# Patient Record
Sex: Male | Born: 1945 | Race: White | Hispanic: No | Marital: Married | Smoking: Never smoker
Health system: Southern US, Community
[De-identification: ages and names within clinical notes are randomized; demographics above are authoritative.]

## PROBLEM LIST (undated history)

## (undated) DIAGNOSIS — I639 Cerebral infarction, unspecified: Secondary | ICD-10-CM

## (undated) DIAGNOSIS — G4733 Obstructive sleep apnea (adult) (pediatric): Secondary | ICD-10-CM

## (undated) DIAGNOSIS — Z950 Presence of cardiac pacemaker: Secondary | ICD-10-CM

## (undated) DIAGNOSIS — C801 Malignant (primary) neoplasm, unspecified: Secondary | ICD-10-CM

## (undated) DIAGNOSIS — R112 Nausea with vomiting, unspecified: Secondary | ICD-10-CM

## (undated) DIAGNOSIS — H269 Unspecified cataract: Secondary | ICD-10-CM

## (undated) DIAGNOSIS — M1712 Unilateral primary osteoarthritis, left knee: Secondary | ICD-10-CM

## (undated) DIAGNOSIS — E785 Hyperlipidemia, unspecified: Secondary | ICD-10-CM

## (undated) DIAGNOSIS — I10 Essential (primary) hypertension: Secondary | ICD-10-CM

## (undated) DIAGNOSIS — N400 Enlarged prostate without lower urinary tract symptoms: Secondary | ICD-10-CM

## (undated) DIAGNOSIS — M1990 Unspecified osteoarthritis, unspecified site: Secondary | ICD-10-CM

## (undated) DIAGNOSIS — E119 Type 2 diabetes mellitus without complications: Secondary | ICD-10-CM

## (undated) DIAGNOSIS — E039 Hypothyroidism, unspecified: Secondary | ICD-10-CM

## (undated) DIAGNOSIS — I219 Acute myocardial infarction, unspecified: Secondary | ICD-10-CM

## (undated) DIAGNOSIS — I2510 Atherosclerotic heart disease of native coronary artery without angina pectoris: Secondary | ICD-10-CM

## (undated) DIAGNOSIS — K6389 Other specified diseases of intestine: Secondary | ICD-10-CM

## (undated) DIAGNOSIS — K219 Gastro-esophageal reflux disease without esophagitis: Secondary | ICD-10-CM

## (undated) DIAGNOSIS — C9110 Chronic lymphocytic leukemia of B-cell type not having achieved remission: Secondary | ICD-10-CM

## (undated) DIAGNOSIS — G4730 Sleep apnea, unspecified: Secondary | ICD-10-CM

## (undated) DIAGNOSIS — Z9889 Other specified postprocedural states: Secondary | ICD-10-CM

## (undated) HISTORY — DX: Essential (primary) hypertension: I10

## (undated) HISTORY — DX: Other specified diseases of intestine: K63.89

## (undated) HISTORY — PX: OTHER SURGICAL HISTORY: SHX169

## (undated) HISTORY — DX: Cerebral infarction, unspecified: I63.9

## (undated) HISTORY — PX: CARDIAC CATHETERIZATION: SHX172

## (undated) HISTORY — DX: Unspecified cataract: H26.9

## (undated) HISTORY — PX: CHOLECYSTECTOMY: SHX55

## (undated) HISTORY — DX: Obstructive sleep apnea (adult) (pediatric): G47.33

## (undated) HISTORY — DX: Unilateral primary osteoarthritis, left knee: M17.12

## (undated) HISTORY — DX: Hyperlipidemia, unspecified: E78.5

## (undated) HISTORY — DX: Acute myocardial infarction, unspecified: I21.9

## (undated) HISTORY — PX: MM CORP SCREENING MAMMO BIL/CAD (ARMC HX): HXRAD1024

## (undated) HISTORY — DX: Sleep apnea, unspecified: G47.30

## (undated) HISTORY — PX: CORONARY ANGIOPLASTY: SHX604

## (undated) HISTORY — DX: Unspecified osteoarthritis, unspecified site: M19.90

---

## 1999-07-26 ENCOUNTER — Emergency Department (HOSPITAL_COMMUNITY): Admission: EM | Admit: 1999-07-26 | Discharge: 1999-07-26 | Payer: Self-pay | Admitting: Emergency Medicine

## 1999-07-27 ENCOUNTER — Encounter: Payer: Self-pay | Admitting: Emergency Medicine

## 1999-07-28 HISTORY — PX: CORONARY ANGIOPLASTY WITH STENT PLACEMENT: SHX49

## 1999-08-22 ENCOUNTER — Encounter: Payer: Self-pay | Admitting: Surgery

## 1999-08-22 ENCOUNTER — Ambulatory Visit (HOSPITAL_COMMUNITY): Admission: EM | Admit: 1999-08-22 | Discharge: 1999-08-23 | Payer: Self-pay | Admitting: Surgery

## 2003-03-06 ENCOUNTER — Encounter: Admission: RE | Admit: 2003-03-06 | Discharge: 2003-03-06 | Payer: Self-pay | Admitting: Emergency Medicine

## 2003-03-06 ENCOUNTER — Encounter: Payer: Self-pay | Admitting: Emergency Medicine

## 2003-04-10 ENCOUNTER — Ambulatory Visit (HOSPITAL_COMMUNITY): Admission: RE | Admit: 2003-04-10 | Discharge: 2003-04-10 | Payer: Self-pay | Admitting: Cardiovascular Disease

## 2003-04-10 ENCOUNTER — Encounter: Payer: Self-pay | Admitting: Cardiovascular Disease

## 2003-07-28 HISTORY — PX: SHOULDER ARTHROSCOPY WITH ROTATOR CUFF REPAIR AND SUBACROMIAL DECOMPRESSION: SHX5686

## 2003-09-25 ENCOUNTER — Ambulatory Visit (HOSPITAL_BASED_OUTPATIENT_CLINIC_OR_DEPARTMENT_OTHER): Admission: RE | Admit: 2003-09-25 | Discharge: 2003-09-25 | Payer: Self-pay | Admitting: Orthopedic Surgery

## 2003-09-25 ENCOUNTER — Ambulatory Visit (HOSPITAL_COMMUNITY): Admission: RE | Admit: 2003-09-25 | Discharge: 2003-09-25 | Payer: Self-pay | Admitting: Orthopedic Surgery

## 2012-06-09 ENCOUNTER — Encounter (HOSPITAL_COMMUNITY): Payer: Self-pay | Admitting: Pharmacy Technician

## 2012-06-15 ENCOUNTER — Other Ambulatory Visit: Payer: Self-pay | Admitting: Physician Assistant

## 2012-06-15 ENCOUNTER — Encounter: Payer: Self-pay | Admitting: Physician Assistant

## 2012-06-15 DIAGNOSIS — E079 Disorder of thyroid, unspecified: Secondary | ICD-10-CM

## 2012-06-15 DIAGNOSIS — E7800 Pure hypercholesterolemia, unspecified: Secondary | ICD-10-CM

## 2012-06-15 DIAGNOSIS — I219 Acute myocardial infarction, unspecified: Secondary | ICD-10-CM

## 2012-06-15 DIAGNOSIS — I10 Essential (primary) hypertension: Secondary | ICD-10-CM

## 2012-06-15 DIAGNOSIS — M1712 Unilateral primary osteoarthritis, left knee: Secondary | ICD-10-CM | POA: Insufficient documentation

## 2012-06-15 NOTE — H&P (Signed)
TOTAL KNEE ADMISSION H&P  Patient is being admitted for left total knee arthroplasty.  Subjective:  Chief Complaint:left knee pain.  HPI: Brandon Soto, 66 y.o. male, has a history of pain and functional disability in the left knee due to arthritis and has failed non-surgical conservative treatments for greater than 12 weeks to includeNSAID's and/or analgesics, corticosteriod injections, flexibility and strengthening excercises, use of assistive devices, weight reduction as appropriate and activity modification.  Onset of symptoms was gradual, starting 9 years ago with gradually worsening course since that time. The patient noted no past surgery on the left knee(s).  Patient currently rates pain in the left knee(s) at 8 out of 10 with activity. Patient has night pain, worsening of pain with activity and weight bearing, pain that interferes with activities of daily living, crepitus and joint swelling.  Patient has evidence of subchondral sclerosis, periarticular osteophytes and joint space narrowing by imaging studies. . There is no active infection.  Patient Active Problem List   Diagnosis Date Noted  . Thyroid disease   . Hypertension   . Hypercholesteremia   . Heart attack   . Left knee DJD    Past Medical History  Diagnosis Date  . Thyroid disease   . Hypertension   . Hypercholesteremia   . Heart attack 2001    Stent  . Left knee DJD     Past Surgical History  Procedure Date  . Coronary angioplasty with stent placement 2001  . Cholecystectomy, laparoscopic   . Shoulder arthroscopy with rotator cuff repair and subacromial decompression 2005  . Coronary angioplasty      (Not in a hospital admission) No Known Allergies   Current Outpatient Prescriptions on File Prior to Visit  Medication Sig Dispense Refill  . acetaminophen (TYLENOL) 325 MG tablet Take 650 mg by mouth every 6 (six) hours as needed. For pain      . aspirin EC 81 MG tablet Take 81 mg by mouth every morning.        [name] atenolol (TENORMIN) 25 MG tablet Take 12.5 mg by mouth daily.      [name] levothyroxine (SYNTHROID, LEVOTHROID) 25 MCG tablet Take 25 mcg by mouth daily.      [name] losartan (COZAAR) 50 MG tablet Take 50 mg by mouth every morning.      . Multiple Vitamin (MULTIVITAMIN WITH MINERALS) TABS Take 1 tablet by mouth daily.      . naproxen sodium (ANAPROX) 220 MG tablet Take 220 mg by mouth 2 (two) times daily as needed. For pain      . omega-3 acid ethyl esters (LOVAZA) 1 G capsule Take 1 g by mouth 2 (two) times daily.      . rosuvastatin (CRESTOR) 20 MG tablet Take 20 mg by mouth at bedtime.      . Tamsulosin HCl (FLOMAX) 0.4 MG CAPS Take 0.4 mg by mouth daily after supper.         History  Substance Use Topics  . Smoking status: Never Smoker   . Smokeless tobacco: Not on file  . Alcohol Use: No    Family History  Problem Relation Age of Onset  . Heart attack Mother   . Heart disease Brother   . Cancer Brother   . Cancer - Lung Brother     . Review of Systems  Constitutional: Negative.   HENT: Negative.   Eyes: Negative.   Respiratory: Negative.   Cardiovascular: Negative.   Gastrointestinal: Negative.   Genitourinary: Negative.  Musculoskeletal: Positive for back pain and joint pain.  Skin: Negative.   Neurological: Negative.   Endo/Heme/Allergies: Negative.   Psychiatric/Behavioral: Negative.     Objective:  Physical Exam  Constitutional: He is oriented to person, place, and time. He appears well-developed and well-nourished.  HENT:  Head: Normocephalic and atraumatic.  Mouth/Throat: Oropharynx is clear and moist.  Eyes: Conjunctivae normal and EOM are normal. Pupils are equal, round, and reactive to light.  Neck: Normal range of motion.  Cardiovascular: Normal rate, regular rhythm and normal heart sounds.   Respiratory: Effort normal and breath sounds normal.  GI: Soft. Bowel sounds are normal.  Genitourinary:       Not pertinent to current symptomatology therefore not  examined.  Musculoskeletal: Normal range of motion.  Neurological: He is alert and oriented to person, place, and time.  Skin: Skin is warm and dry.  Psychiatric: He has a normal mood and affect. His behavior is normal. Judgment and thought content normal.    Vital signs in last 24 hours: Last recorded: 11/20 1300   BP: 154/85 Pulse: 63  Temp: 97.9 F (36.6 C)    Height: 6' (1.829 m) SpO2: 96  Weight: 108.863 kg (240 lb)     Labs:   Estimated Body mass index is 32.55 kg/(m^2) as calculated from the following:   Height as of this encounter: 6\' 0" (1.829 m).   Weight as of this encounter: 240 lb(108.863 kg).   Imaging Review Plain radiographs demonstrate severe degenerative joint disease of the left knee(s). The overall alignment issignificant varus. The bone quality appears to be good for age and reported activity level.  Assessment/Plan:  End stage arthritis, left knee  Patient Active Problem List  Diagnosis  . Thyroid disease  . Hypertension  . Hypercholesteremia  . Heart attack  . Left knee DJD    The patient history, physical examination, clinical judgment of the provider and imaging studies are consistent with end stage degenerative joint disease of the left knee(s) and total knee arthroplasty is deemed medically necessary. The treatment options including medical management, injection therapy arthroscopy and arthroplasty were discussed at length. The risks and benefits of total knee arthroplasty were presented and reviewed. The risks due to aseptic loosening, infection, stiffness, patella tracking problems, thromboembolic complications and other imponderables were discussed. The patient acknowledged the explanation, agreed to proceed with the plan and consent was signed. Patient is being admitted for inpatient treatment for surgery, pain control, PT, OT, prophylactic antibiotics, VTE prophylaxis, progressive ambulation and ADL's and discharge planning. The patient is  planning to be discharged home with home health services  Jonique Kulig A. Gwinda Passe Physician Assistant [facility] [phone]  06/15/2012, 2:14 PM

## 2012-06-20 ENCOUNTER — Encounter (HOSPITAL_COMMUNITY)
Admission: RE | Admit: 2012-06-20 | Discharge: 2012-06-20 | Disposition: A | Discharge status: Home / Self Care | Payer: BC Managed Care – PPO | Source: Ambulatory Visit | Attending: Orthopedic Surgery | Admitting: Orthopedic Surgery

## 2012-06-20 ENCOUNTER — Encounter (HOSPITAL_COMMUNITY): Payer: Self-pay

## 2012-06-20 ENCOUNTER — Ambulatory Visit (HOSPITAL_COMMUNITY)
Admission: RE | Admit: 2012-06-20 | Discharge: 2012-06-20 | Disposition: A | Discharge status: Home / Self Care | Payer: BC Managed Care – PPO | Source: Ambulatory Visit | Attending: Physician Assistant | Admitting: Physician Assistant

## 2012-06-20 DIAGNOSIS — Z01818 Encounter for other preprocedural examination: Secondary | ICD-10-CM | POA: Insufficient documentation

## 2012-06-20 HISTORY — DX: Benign prostatic hyperplasia without lower urinary tract symptoms: N40.0

## 2012-06-20 HISTORY — DX: Hypothyroidism, unspecified: E03.9

## 2012-06-20 LAB — CBC WITH DIFFERENTIAL/PLATELET
Eosinophils Absolute: 0.1 10*3/uL (ref 0.0–0.7)
Eosinophils Relative: 1 % (ref 0–5)
HCT: 45.4 % (ref 39.0–52.0)
Lymphocytes Relative: 33 % (ref 12–46)
Lymphs Abs: 1.9 10*3/uL (ref 0.7–4.0)
MCH: 33.4 pg (ref 26.0–34.0)
MCV: 95.4 fL (ref 78.0–100.0)
Monocytes Absolute: 0.5 10*3/uL (ref 0.1–1.0)
Platelets: 167 10*3/uL (ref 150–400)
RBC: 4.76 MIL/uL (ref 4.22–5.81)
RDW: 12.1 % (ref 11.5–15.5)
WBC: 5.7 10*3/uL (ref 4.0–10.5)

## 2012-06-20 LAB — URINALYSIS, ROUTINE W REFLEX MICROSCOPIC
Bilirubin Urine: NEGATIVE
Glucose, UA: NEGATIVE mg/dL
Hgb urine dipstick: NEGATIVE
Ketones, ur: NEGATIVE mg/dL
Protein, ur: NEGATIVE mg/dL
Urobilinogen, UA: 0.2 mg/dL (ref 0.0–1.0)

## 2012-06-20 LAB — COMPREHENSIVE METABOLIC PANEL
CO2: 29 mEq/L (ref 19–32)
Calcium: 9.8 mg/dL (ref 8.4–10.5)
Creatinine, Ser: 0.88 mg/dL (ref 0.50–1.35)
GFR calc Af Amer: >90 mL/min (ref >90)
GFR calc non Af Amer: 87 mL/min — ABNORMAL LOW (ref >90)
Glucose, Bld: 109 mg/dL — ABNORMAL HIGH (ref 70–99)
Total Protein: 7.6 g/dL (ref 6.0–8.3)

## 2012-06-20 LAB — SURGICAL PCR SCREEN: MRSA, PCR: NEGATIVE

## 2012-06-20 LAB — ABO/RH: ABO/RH(D): A POS

## 2012-06-20 LAB — PROTIME-INR
INR: 1.05 (ref 0.00–1.49)
Prothrombin Time: 13.6 seconds (ref 11.6–15.2)

## 2012-06-20 LAB — TYPE AND SCREEN
ABO/RH(D): A POS
Antibody Screen: NEGATIVE

## 2012-06-20 NOTE — Pre-Procedure Instructions (Signed)
[address]  06/20/2012   Your procedure is scheduled on:  Monday, December 2nd  Report to [facility] at 0530 AM.  Call this number if you have problems the morning of surgery: [phone]   Remember:   Do not eat food or drink:After Midnight.   Take these medicines the morning of surgery with A SIP OF WATER: atenolol, synthroid, tylenol if needed   Do not wear jewelry, make-up or nail polish.  Do not wear lotions, powders, or perfumes.   Do not shave 48 hours prior to surgery. Men may shave face and neck.  Do not bring valuables to the hospital.  Contacts, dentures or bridgework may not be worn into surgery.  Leave suitcase in the car. After surgery it may be brought to your room.  For patients admitted to the hospital, checkout time is 11:00 AM the day of discharge.   Patients discharged the day of surgery will not be allowed to drive home.   Special Instructions: Shower using CHG 2 nights before surgery and the night before surgery.  If you shower the day of surgery use CHG.  Use special wash - you have one bottle of CHG for all showers.  You should use approximately 1/3 of the bottle for each shower.   Please read over the following fact sheets that you were given: Pain Booklet, Coughing and Deep Breathing, Blood Transfusion Information, MRSA Information and Surgical Site Infection Prevention

## 2012-06-20 NOTE — Progress Notes (Signed)
Primary Physician - Dr. Jenita Seashore [facility] Cardiologist - Dr. Allyson Sabal -  Recent EKG, Stess Test. Echo - will request records

## 2012-06-26 MED ORDER — DEXTROSE 5 % IV SOLN
3.0000 g | INTRAVENOUS | Status: AC
  Administered 2012-06-27: 3 g via INTRAVENOUS
  Filled 2012-06-26: qty 3000

## 2012-06-27 ENCOUNTER — Encounter (HOSPITAL_COMMUNITY): Payer: Self-pay | Admitting: Certified Registered"

## 2012-06-27 ENCOUNTER — Encounter (HOSPITAL_COMMUNITY)
Admission: RE | Disposition: A | Discharge status: Home Health | Payer: Self-pay | Source: Ambulatory Visit | Attending: Orthopedic Surgery

## 2012-06-27 ENCOUNTER — Encounter (HOSPITAL_COMMUNITY): Payer: Self-pay | Admitting: *Deleted

## 2012-06-27 ENCOUNTER — Inpatient Hospital Stay (HOSPITAL_COMMUNITY)
Admission: RE | Admit: 2012-06-27 | Discharge: 2012-06-28 | DRG: 209 | Disposition: A | Discharge status: Home Health | Payer: BC Managed Care – PPO | Source: Ambulatory Visit | Attending: Orthopedic Surgery | Admitting: Orthopedic Surgery

## 2012-06-27 ENCOUNTER — Encounter (HOSPITAL_COMMUNITY): Payer: Self-pay | Admitting: General Practice

## 2012-06-27 ENCOUNTER — Ambulatory Visit (HOSPITAL_COMMUNITY): Payer: BC Managed Care – PPO | Admitting: Certified Registered"

## 2012-06-27 DIAGNOSIS — E039 Hypothyroidism, unspecified: Secondary | ICD-10-CM | POA: Diagnosis present

## 2012-06-27 DIAGNOSIS — Z7982 Long term (current) use of aspirin: Secondary | ICD-10-CM

## 2012-06-27 DIAGNOSIS — Z9861 Coronary angioplasty status: Secondary | ICD-10-CM

## 2012-06-27 DIAGNOSIS — E7800 Pure hypercholesterolemia, unspecified: Secondary | ICD-10-CM | POA: Diagnosis present

## 2012-06-27 DIAGNOSIS — Z79899 Other long term (current) drug therapy: Secondary | ICD-10-CM

## 2012-06-27 DIAGNOSIS — M1710 Unilateral primary osteoarthritis, unspecified knee: Principal | ICD-10-CM | POA: Diagnosis present

## 2012-06-27 DIAGNOSIS — M1712 Unilateral primary osteoarthritis, left knee: Secondary | ICD-10-CM | POA: Diagnosis present

## 2012-06-27 DIAGNOSIS — E079 Disorder of thyroid, unspecified: Secondary | ICD-10-CM | POA: Diagnosis present

## 2012-06-27 DIAGNOSIS — N400 Enlarged prostate without lower urinary tract symptoms: Secondary | ICD-10-CM | POA: Diagnosis present

## 2012-06-27 DIAGNOSIS — Z8249 Family history of ischemic heart disease and other diseases of the circulatory system: Secondary | ICD-10-CM

## 2012-06-27 DIAGNOSIS — M069 Rheumatoid arthritis, unspecified: Secondary | ICD-10-CM | POA: Diagnosis present

## 2012-06-27 DIAGNOSIS — Z9089 Acquired absence of other organs: Secondary | ICD-10-CM

## 2012-06-27 DIAGNOSIS — I2510 Atherosclerotic heart disease of native coronary artery without angina pectoris: Secondary | ICD-10-CM | POA: Diagnosis present

## 2012-06-27 DIAGNOSIS — I219 Acute myocardial infarction, unspecified: Secondary | ICD-10-CM | POA: Diagnosis present

## 2012-06-27 DIAGNOSIS — K219 Gastro-esophageal reflux disease without esophagitis: Secondary | ICD-10-CM | POA: Diagnosis present

## 2012-06-27 DIAGNOSIS — I10 Essential (primary) hypertension: Secondary | ICD-10-CM | POA: Diagnosis present

## 2012-06-27 DIAGNOSIS — I252 Old myocardial infarction: Secondary | ICD-10-CM

## 2012-06-27 HISTORY — DX: Other specified postprocedural states: Z98.890

## 2012-06-27 HISTORY — DX: Atherosclerotic heart disease of native coronary artery without angina pectoris: I25.10

## 2012-06-27 HISTORY — DX: Nausea with vomiting, unspecified: R11.2

## 2012-06-27 HISTORY — PX: PARTIAL KNEE ARTHROPLASTY: SHX2174

## 2012-06-27 HISTORY — DX: Gastro-esophageal reflux disease without esophagitis: K21.9

## 2012-06-27 SURGERY — ARTHROPLASTY, KNEE, UNICOMPARTMENTAL
Anesthesia: General | Site: Knee | Laterality: Left | Wound class: Clean

## 2012-06-27 MED ORDER — LACTATED RINGERS IV SOLN
INTRAVENOUS | Status: DC | PRN
  Administered 2012-06-27 (×2): via INTRAVENOUS

## 2012-06-27 MED ORDER — LEVOTHYROXINE SODIUM 25 MCG PO TABS
25.0000 ug | ORAL_TABLET | Freq: Every day | ORAL | Status: DC
  Administered 2012-06-28: 25 ug via ORAL
  Filled 2012-06-27 (×2): qty 1

## 2012-06-27 MED ORDER — ZOLPIDEM TARTRATE 5 MG PO TABS: 5.0000 mg | ORAL_TABLET | Freq: Every evening | ORAL | Status: DC | PRN

## 2012-06-27 MED ORDER — METOCLOPRAMIDE HCL 5 MG/ML IJ SOLN: 5.0000 mg | Freq: Three times a day (TID) | INTRAMUSCULAR | Status: DC | PRN

## 2012-06-27 MED ORDER — ADULT MULTIVITAMIN W/MINERALS CH
1.0000 | ORAL_TABLET | Freq: Every day | ORAL | Status: DC
  Administered 2012-06-27 – 2012-06-28 (×2): 1 via ORAL
  Filled 2012-06-27 (×2): qty 1

## 2012-06-27 MED ORDER — ACETAMINOPHEN 325 MG PO TABS: 650.0000 mg | ORAL_TABLET | Freq: Four times a day (QID) | ORAL | Status: DC | PRN

## 2012-06-27 MED ORDER — ENOXAPARIN SODIUM 30 MG/0.3ML ~~LOC~~ SOLN
30.0000 mg | Freq: Two times a day (BID) | SUBCUTANEOUS | Status: DC
  Administered 2012-06-28: 30 mg via SUBCUTANEOUS
  Filled 2012-06-27 (×3): qty 0.3

## 2012-06-27 MED ORDER — ATORVASTATIN CALCIUM 40 MG PO TABS
40.0000 mg | ORAL_TABLET | Freq: Every day | ORAL | Status: DC
  Administered 2012-06-27: 40 mg via ORAL
  Filled 2012-06-27 (×2): qty 1

## 2012-06-27 MED ORDER — LACTATED RINGERS IV SOLN: INTRAVENOUS | Status: DC

## 2012-06-27 MED ORDER — GLYCOPYRROLATE 0.2 MG/ML IJ SOLN
INTRAMUSCULAR | Status: DC | PRN
  Administered 2012-06-27 (×2): 0.2 mg via INTRAVENOUS

## 2012-06-27 MED ORDER — CHLORHEXIDINE GLUCONATE 4 % EX LIQD: 60.0000 mL | Freq: Once | CUTANEOUS | Status: DC

## 2012-06-27 MED ORDER — HYDROMORPHONE HCL PF 1 MG/ML IJ SOLN
0.2500 mg | INTRAMUSCULAR | Status: DC | PRN
  Administered 2012-06-27 (×2): 0.5 mg via INTRAVENOUS

## 2012-06-27 MED ORDER — CEFUROXIME SODIUM 1.5 G IJ SOLR
INTRAMUSCULAR | Status: DC | PRN
  Administered 2012-06-27: 1.5 g

## 2012-06-27 MED ORDER — PROPOFOL 10 MG/ML IV BOLUS
INTRAVENOUS | Status: DC | PRN
  Administered 2012-06-27: 190 mg via INTRAVENOUS

## 2012-06-27 MED ORDER — OXYCODONE HCL 5 MG PO TABS
5.0000 mg | ORAL_TABLET | ORAL | Status: DC | PRN
  Administered 2012-06-28: 5 mg via ORAL
  Administered 2012-06-28: 10 mg via ORAL
  Filled 2012-06-27: qty 2
  Filled 2012-06-27: qty 1

## 2012-06-27 MED ORDER — ACETAMINOPHEN 10 MG/ML IV SOLN
1000.0000 mg | Freq: Four times a day (QID) | INTRAVENOUS | Status: AC
  Administered 2012-06-27 – 2012-06-28 (×4): 1000 mg via INTRAVENOUS
  Filled 2012-06-27 (×4): qty 100

## 2012-06-27 MED ORDER — OXYCODONE HCL 5 MG PO TABS
5.0000 mg | ORAL_TABLET | Freq: Once | ORAL | Status: AC | PRN
  Administered 2012-06-27: 5 mg via ORAL

## 2012-06-27 MED ORDER — MENTHOL 3 MG MT LOZG: 1.0000 | LOZENGE | OROMUCOSAL | Status: DC | PRN

## 2012-06-27 MED ORDER — PHENOL 1.4 % MT LIQD: 1.0000 | OROMUCOSAL | Status: DC | PRN

## 2012-06-27 MED ORDER — ONDANSETRON HCL 4 MG/2ML IJ SOLN: 4.0000 mg | Freq: Four times a day (QID) | INTRAMUSCULAR | Status: DC | PRN

## 2012-06-27 MED ORDER — BUPIVACAINE-EPINEPHRINE PF 0.5-1:200000 % IJ SOLN
INTRAMUSCULAR | Status: DC | PRN
  Administered 2012-06-27: 30 mL

## 2012-06-27 MED ORDER — POTASSIUM CHLORIDE IN NACL 20-0.9 MEQ/L-% IV SOLN
INTRAVENOUS | Status: DC
  Administered 2012-06-27 – 2012-06-28 (×2): via INTRAVENOUS
  Filled 2012-06-27 (×5): qty 1000

## 2012-06-27 MED ORDER — DEXAMETHASONE SODIUM PHOSPHATE 10 MG/ML IJ SOLN
10.0000 mg | Freq: Every day | INTRAMUSCULAR | Status: DC
  Administered 2012-06-27: 10 mg via INTRAVENOUS
  Filled 2012-06-27 (×2): qty 1

## 2012-06-27 MED ORDER — ONDANSETRON HCL 4 MG/2ML IJ SOLN
INTRAMUSCULAR | Status: DC | PRN
  Administered 2012-06-27: 4 mg via INTRAVENOUS

## 2012-06-27 MED ORDER — FENTANYL CITRATE 0.05 MG/ML IJ SOLN
INTRAMUSCULAR | Status: DC | PRN
  Administered 2012-06-27: 50 ug via INTRAVENOUS
  Administered 2012-06-27: 100 ug via INTRAVENOUS

## 2012-06-27 MED ORDER — BISACODYL 5 MG PO TBEC
10.0000 mg | DELAYED_RELEASE_TABLET | Freq: Every day | ORAL | Status: DC
  Administered 2012-06-27: 10 mg via ORAL
  Filled 2012-06-27: qty 2

## 2012-06-27 MED ORDER — CEFUROXIME SODIUM 1.5 G IJ SOLR
INTRAMUSCULAR | Status: AC
  Filled 2012-06-27: qty 1.5

## 2012-06-27 MED ORDER — DIPHENHYDRAMINE HCL 12.5 MG/5ML PO ELIX: 12.5000 mg | ORAL_SOLUTION | ORAL | Status: DC | PRN

## 2012-06-27 MED ORDER — BUPIVACAINE-EPINEPHRINE PF 0.25-1:200000 % IJ SOLN
INTRAMUSCULAR | Status: AC
  Filled 2012-06-27: qty 30

## 2012-06-27 MED ORDER — CELECOXIB 200 MG PO CAPS
200.0000 mg | ORAL_CAPSULE | Freq: Two times a day (BID) | ORAL | Status: DC
  Administered 2012-06-27 (×2): 200 mg via ORAL
  Filled 2012-06-27 (×4): qty 1

## 2012-06-27 MED ORDER — METOCLOPRAMIDE HCL 10 MG PO TABS: 5.0000 mg | ORAL_TABLET | Freq: Three times a day (TID) | ORAL | Status: DC | PRN

## 2012-06-27 MED ORDER — HYDROMORPHONE HCL PF 1 MG/ML IJ SOLN
INTRAMUSCULAR | Status: AC
  Administered 2012-06-27: 0.5 mg via INTRAVENOUS
  Filled 2012-06-27: qty 1

## 2012-06-27 MED ORDER — BUPIVACAINE-EPINEPHRINE PF 0.25-1:200000 % IJ SOLN
INTRAMUSCULAR | Status: DC | PRN
  Administered 2012-06-27: 30 mL

## 2012-06-27 MED ORDER — ACETAMINOPHEN 650 MG RE SUPP: 650.0000 mg | Freq: Four times a day (QID) | RECTAL | Status: DC | PRN

## 2012-06-27 MED ORDER — DEXAMETHASONE 4 MG PO TABS
10.0000 mg | ORAL_TABLET | Freq: Every day | ORAL | Status: DC
  Administered 2012-06-28: 10 mg via ORAL
  Filled 2012-06-27 (×2): qty 1

## 2012-06-27 MED ORDER — MIDAZOLAM HCL 5 MG/5ML IJ SOLN
INTRAMUSCULAR | Status: DC | PRN
  Administered 2012-06-27: 2 mg via INTRAVENOUS

## 2012-06-27 MED ORDER — ATENOLOL 12.5 MG HALF TABLET
12.5000 mg | ORAL_TABLET | Freq: Every day | ORAL | Status: DC
  Administered 2012-06-28: 12.5 mg via ORAL
  Filled 2012-06-27: qty 1

## 2012-06-27 MED ORDER — LIDOCAINE HCL (CARDIAC) 20 MG/ML IV SOLN
INTRAVENOUS | Status: DC | PRN
  Administered 2012-06-27: 80 mg via INTRAVENOUS

## 2012-06-27 MED ORDER — HYDROMORPHONE HCL PF 1 MG/ML IJ SOLN
0.5000 mg | INTRAMUSCULAR | Status: DC | PRN
  Administered 2012-06-27: 0.5 mg via INTRAVENOUS
  Filled 2012-06-27: qty 1

## 2012-06-27 MED ORDER — DOCUSATE SODIUM 100 MG PO CAPS
100.0000 mg | ORAL_CAPSULE | Freq: Two times a day (BID) | ORAL | Status: DC
  Administered 2012-06-27 – 2012-06-28 (×3): 100 mg via ORAL
  Filled 2012-06-27 (×3): qty 1

## 2012-06-27 MED ORDER — TAMSULOSIN HCL 0.4 MG PO CAPS
0.4000 mg | ORAL_CAPSULE | Freq: Every day | ORAL | Status: DC
  Administered 2012-06-27: 0.4 mg via ORAL
  Filled 2012-06-27 (×2): qty 1

## 2012-06-27 MED ORDER — CEFAZOLIN SODIUM-DEXTROSE 2-3 GM-% IV SOLR
2.0000 g | Freq: Four times a day (QID) | INTRAVENOUS | Status: AC
  Administered 2012-06-27 (×2): 2 g via INTRAVENOUS
  Filled 2012-06-27 (×2): qty 50

## 2012-06-27 MED ORDER — LOSARTAN POTASSIUM 50 MG PO TABS
50.0000 mg | ORAL_TABLET | Freq: Every day | ORAL | Status: DC
  Administered 2012-06-27 – 2012-06-28 (×2): 50 mg via ORAL
  Filled 2012-06-27 (×2): qty 1

## 2012-06-27 MED ORDER — ALUM & MAG HYDROXIDE-SIMETH 200-200-20 MG/5ML PO SUSP: 30.0000 mL | ORAL | Status: DC | PRN

## 2012-06-27 MED ORDER — ONDANSETRON HCL 4 MG PO TABS: 4.0000 mg | ORAL_TABLET | Freq: Four times a day (QID) | ORAL | Status: DC | PRN

## 2012-06-27 MED ORDER — OXYCODONE HCL 5 MG/5ML PO SOLN: 5.0000 mg | Freq: Once | ORAL | Status: AC | PRN

## 2012-06-27 MED ORDER — 0.9 % SODIUM CHLORIDE (POUR BTL) OPTIME
TOPICAL | Status: DC | PRN
  Administered 2012-06-27: 1000 mL

## 2012-06-27 MED ORDER — POVIDONE-IODINE 7.5 % EX SOLN: Freq: Once | CUTANEOUS | Status: DC

## 2012-06-27 MED ORDER — ONDANSETRON HCL 4 MG/2ML IJ SOLN
4.0000 mg | Freq: Four times a day (QID) | INTRAMUSCULAR | Status: DC | PRN
  Administered 2012-06-27: 4 mg via INTRAVENOUS
  Filled 2012-06-27: qty 2

## 2012-06-27 MED ORDER — OXYCODONE HCL 5 MG PO TABS
ORAL_TABLET | ORAL | Status: AC
  Filled 2012-06-27: qty 1

## 2012-06-27 SURGICAL SUPPLY — 67 items
AUTOTRANSFUSION W/QD PVC DRAIN (AUTOTRANSFUSION) IMPLANT
BANDAGE ELASTIC 4 VELCRO ST LF (GAUZE/BANDAGES/DRESSINGS) ×2 IMPLANT
BANDAGE ELASTIC 6 VELCRO ST LF (GAUZE/BANDAGES/DRESSINGS) ×2 IMPLANT
BANDAGE ESMARK 6X9 LF (GAUZE/BANDAGES/DRESSINGS) ×1 IMPLANT
BNDG ELASTIC 6X10 VLCR STRL LF (GAUZE/BANDAGES/DRESSINGS) ×2 IMPLANT
BNDG ESMARK 6X9 LF (GAUZE/BANDAGES/DRESSINGS) ×2
BOWL SMART MIX CTS (DISPOSABLE) ×2 IMPLANT
CEMENT HV SMART SET (Cement) ×2 IMPLANT
CLOTH BEACON ORANGE TIMEOUT ST (SAFETY) ×2 IMPLANT
COVER BACK TABLE 24X17X13 BIG (DRAPES) IMPLANT
COVER SURGICAL LIGHT HANDLE (MISCELLANEOUS) ×2 IMPLANT
CUFF TOURNIQUET SINGLE 34IN LL (TOURNIQUET CUFF) ×2 IMPLANT
CUFF TOURNIQUET SINGLE 44IN (TOURNIQUET CUFF) IMPLANT
DRAPE EXTREMITY T 121X128X90 (DRAPE) ×2 IMPLANT
DRAPE INCISE IOBAN 66X45 STRL (DRAPES) ×2 IMPLANT
DRAPE PROXIMA HALF (DRAPES) ×2 IMPLANT
DRAPE U-SHAPE 47X51 STRL (DRAPES) ×2 IMPLANT
DRSG ADAPTIC 3X8 NADH LF (GAUZE/BANDAGES/DRESSINGS) ×2 IMPLANT
DRSG PAD ABDOMINAL 8X10 ST (GAUZE/BANDAGES/DRESSINGS) ×4 IMPLANT
DURAPREP 26ML APPLICATOR (WOUND CARE) ×2 IMPLANT
ELECT CAUTERY BLADE 6.4 (BLADE) ×2 IMPLANT
ELECT REM PT RETURN 9FT ADLT (ELECTROSURGICAL) ×2
ELECTRODE REM PT RTRN 9FT ADLT (ELECTROSURGICAL) ×1 IMPLANT
EVACUATOR 1/8 PVC DRAIN (DRAIN) ×2 IMPLANT
FACESHIELD LNG OPTICON STERILE (SAFETY) ×2 IMPLANT
GLOVE BIO SURGEON STRL SZ7 (GLOVE) ×2 IMPLANT
GLOVE BIOGEL PI IND STRL 7.0 (GLOVE) ×3 IMPLANT
GLOVE BIOGEL PI IND STRL 7.5 (GLOVE) ×1 IMPLANT
GLOVE BIOGEL PI IND STRL 8 (GLOVE) ×1 IMPLANT
GLOVE BIOGEL PI INDICATOR 7.0 (GLOVE) ×3
GLOVE BIOGEL PI INDICATOR 7.5 (GLOVE) ×1
GLOVE BIOGEL PI INDICATOR 8 (GLOVE) ×1
GLOVE ORTHO TXT STRL SZ7.5 (GLOVE) ×2 IMPLANT
GLOVE SS BIOGEL STRL SZ 7.5 (GLOVE) ×1 IMPLANT
GLOVE SUPERSENSE BIOGEL SZ 7.5 (GLOVE) ×1
GLOVE SURG SS PI 6.5 STRL IVOR (GLOVE) ×4 IMPLANT
GOWN BRE IMP PREV XXLGXLNG (GOWN DISPOSABLE) ×2 IMPLANT
GOWN PREVENTION PLUS XLARGE (GOWN DISPOSABLE) ×4 IMPLANT
GOWN STRL NON-REIN LRG LVL3 (GOWN DISPOSABLE) IMPLANT
HANDPIECE INTERPULSE COAX TIP (DISPOSABLE) ×1
HOOD PEEL AWAY FACE SHEILD DIS (HOOD) ×6 IMPLANT
IMMOBILIZER KNEE 22 UNIV (SOFTGOODS) ×4 IMPLANT
KIT BASIN OR (CUSTOM PROCEDURE TRAY) ×2 IMPLANT
KIT ROOM TURNOVER OR (KITS) ×2 IMPLANT
KIT SAW BLADE (KITS) ×2 IMPLANT
MANIFOLD NEPTUNE II (INSTRUMENTS) ×2 IMPLANT
NS IRRIG 1000ML POUR BTL (IV SOLUTION) ×2 IMPLANT
PACK TOTAL JOINT (CUSTOM PROCEDURE TRAY) ×2 IMPLANT
PAD ARMBOARD 7.5X6 YLW CONV (MISCELLANEOUS) ×4 IMPLANT
PAD CAST 4YDX4 CTTN HI CHSV (CAST SUPPLIES) ×1 IMPLANT
PADDING CAST COTTON 4X4 STRL (CAST SUPPLIES) ×1
PADDING CAST COTTON 6X4 STRL (CAST SUPPLIES) ×2 IMPLANT
RUBBERBAND STERILE (MISCELLANEOUS) ×2 IMPLANT
SET HNDPC FAN SPRY TIP SCT (DISPOSABLE) ×1 IMPLANT
SPONGE GAUZE 4X4 12PLY (GAUZE/BANDAGES/DRESSINGS) ×2 IMPLANT
STRIP CLOSURE SKIN 1/2X4 (GAUZE/BANDAGES/DRESSINGS) ×2 IMPLANT
SUCTION FRAZIER TIP 10 FR DISP (SUCTIONS) ×2 IMPLANT
SUT ETHIBOND NAB CT1 #1 30IN (SUTURE) ×2 IMPLANT
SUT MNCRL AB 3-0 PS2 18 (SUTURE) ×2 IMPLANT
SUT VIC AB 0 CT1 27 (SUTURE) ×1
SUT VIC AB 0 CT1 27XBRD ANBCTR (SUTURE) ×1 IMPLANT
SUT VIC AB 2-0 CT1 27 (SUTURE) ×1
SUT VIC AB 2-0 CT1 TAPERPNT 27 (SUTURE) ×1 IMPLANT
TOWEL OR 17X24 6PK STRL BLUE (TOWEL DISPOSABLE) ×2 IMPLANT
TOWEL OR 17X26 10 PK STRL BLUE (TOWEL DISPOSABLE) ×2 IMPLANT
TRAY FOLEY CATH 14FR (SET/KITS/TRAYS/PACK) ×2 IMPLANT
WATER STERILE IRR 1000ML POUR (IV SOLUTION) ×4 IMPLANT

## 2012-06-27 NOTE — Evaluation (Signed)
Physical Therapy Evaluation Patient Details Name: Brandon Soto MRN: [id number] DOB: 09-13-1945 Today's Date: 06/27/2012 Time: 4098-1191 PT Time Calculation (min): 24 min  PT Assessment / Plan / Recommendation Clinical Impression  Patient is a 66 yo male s/p Lt. TKA.  Did well with mobility today despite nausea.  Patient will benefit from acute PT to maximize independence prior to discharge home with wife.    PT Assessment  Patient needs continued PT services    Follow Up Recommendations  Home health PT;Supervision/Assistance - 24 hour    Does the patient have the potential to tolerate intense rehabilitation      Barriers to Discharge None      Equipment Recommendations  None recommended by PT    Recommendations for Other Services     Frequency 7X/week    Precautions / Restrictions Precautions Precautions: Knee Precaution Booklet Issued: Yes (comment) Precaution Comments: Reviewed precautions with patient and wife Required Braces or Orthoses: Knee Immobilizer - Left Knee Immobilizer - Left: On except when in CPM;Discontinue once straight leg raise with < 10 degree lag Restrictions Weight Bearing Restrictions: Yes LLE Weight Bearing: Weight bearing as tolerated   Pertinent Vitals/Pain       Mobility  Bed Mobility Bed Mobility: Supine to Sit;Sitting - Scoot to Edge of Bed Supine to Sit: 4: Min assist;HOB flat Sitting - Scoot to [place] of Bed: 4: Min guard Details for Bed Mobility Assistance: Verbal cues for technique.  Assist to move LLE off bed.  Instructed patient and wife donning KI.  Patient became nauseated in sitting and vomited - RN made aware.   Transfers Transfers: Sit to Stand;Stand to Sit Sit to Stand: With upper extremity assist;3: Mod assist;From bed Stand to Sit: 3: Mod assist;With upper extremity assist;With armrests;To chair/3-in-1 Stand Pivot Transfers: 3: Mod assist Details for Transfer Assistance: Verbal cues for technique.  Assist to initiate  standing.  Decreased control of LLE.  Patient able to use UE's on RW to take a few steps to chair.  Cues for LLE placement during descent to chair.  Assist to control descent. Ambulation/Gait Ambulation/Gait Assistance: Not tested (comment)       Exercises Total Joint Exercises Ankle Circles/Pumps: AROM;Both;10 reps;Seated   PT Diagnosis: Difficulty walking;Acute pain  PT Problem List: Decreased strength;Decreased range of motion;Decreased activity tolerance;Decreased mobility;Decreased knowledge of use of DME;Decreased knowledge of precautions;Pain PT Treatment Interventions: DME instruction;Gait training;Stair training;Functional mobility training;Therapeutic exercise;Patient/family education   PT Goals Acute Rehab PT Goals PT Goal Formulation: With patient/family Time For Goal Achievement: 07/04/12 Potential to Achieve Goals: Good Pt will go Supine/Side to Sit: Independently;with HOB 0 degrees PT Goal: Supine/Side to Sit - Progress: Goal set today Pt will go Sit to Supine/Side: Independently;with HOB 0 degrees PT Goal: Sit to Supine/Side - Progress: Goal set today Pt will go Sit to Stand: with supervision;with upper extremity assist PT Goal: Sit to Stand - Progress: Goal set today Pt will Ambulate: >150 feet;with supervision;with rolling walker PT Goal: Ambulate - Progress: Goal set today Pt will Go Up / Down Stairs: 1-2 stairs;with supervision;with least restrictive assistive device PT Goal: Up/Down Stairs - Progress: Goal set today Pt will Perform Home Exercise Program: Independently PT Goal: Perform Home Exercise Program - Progress: Goal set today  Visit Information  Last PT Received On: 06/27/12 Assistance Needed: +1    Subjective Data  Subjective: "I still want to get to the chair" (Patient nauseated/vomited in sitting) Patient Stated Goal: To go home soon   Prior Functioning  Home Living Lives With: Spouse Available Help at Discharge: Family;Available 24 hours/day (x  2 weeks) Type of Home: House Home Access: Stairs to enter [employer] of Steps: 1 Entrance Stairs-Rails: None Home Layout: One level Bathroom Shower/Tub: Psychologist, counselling (built-in seat) Bathroom Toilet: Handicapped height Bathroom Accessibility: Yes How Accessible: Accessible via walker Home Adaptive Equipment: Walker - rolling;Straight cane;Crutches Prior Function Level of Independence: Independent Able to Take Stairs?: Yes Driving: Yes Vocation: Retired Musician: No difficulties    Cognition  Overall Cognitive Status: Appears within functional limits for tasks assessed/performed Arousal/Alertness: Awake/alert Orientation Level: Oriented X4 / Intact Behavior During Session: WFL for tasks performed    Extremity/Trunk Assessment Right Upper Extremity Assessment RUE ROM/Strength/Tone: Within functional levels RUE Sensation: WFL - Light Touch Left Upper Extremity Assessment LUE ROM/Strength/Tone: Within functional levels LUE Sensation: WFL - Light Touch Right Lower Extremity Assessment RLE ROM/Strength/Tone: Within functional levels RLE Sensation: WFL - Light Touch Left Lower Extremity Assessment LLE ROM/Strength/Tone: Deficits;Unable to fully assess;Due to pain LLE ROM/Strength/Tone Deficits: Able to assist moving LLE off bed. Trunk Assessment Trunk Assessment: Normal   Balance Balance Balance Assessed: Yes Static Sitting Balance Static Sitting - Balance Support: No upper extremity supported;Feet supported Static Sitting - Level of Assistance: 5: Stand by assistance Static Sitting - Comment/# of Minutes: Patient sat on EOB x 8 minutes.  Able to maintain balance without UE support. (Pt became nauseated and vomited - RN notified)  End of Session PT - End of Session Equipment Utilized During Treatment: Gait belt;Left knee immobilizer;Oxygen Activity Tolerance: Patient limited by fatigue;Treatment limited secondary to medical complications  (Comment) (Nausea/vomiting) Patient left: in chair;with call bell/phone within reach;with family/visitor present Nurse Communication: Mobility status (Nausea) CPM Left Knee CPM Left Knee: Off Left Knee Flexion (Degrees): 60  Left Knee Extension (Degrees): 0   GP     Vena Austria 06/27/2012, 4:59 PM Durenda Hurt. Renaldo Fiddler, [facility] Acute Rehab Services Pager [phone]

## 2012-06-27 NOTE — H&P (View-Only) (Signed)
TOTAL KNEE ADMISSION H&P  Patient is being admitted for left total knee arthroplasty.  Subjective:  Chief Complaint:left knee pain.  HPI: Brandon Soto, 66 y.o. male, has a history of pain and functional disability in the left knee due to arthritis and has failed non-surgical conservative treatments for greater than 12 weeks to includeNSAID's and/or analgesics, corticosteriod injections, flexibility and strengthening excercises, use of assistive devices, weight reduction as appropriate and activity modification.  Onset of symptoms was gradual, starting 9 years ago with gradually worsening course since that time. The patient noted no past surgery on the left knee(s).  Patient currently rates pain in the left knee(s) at 8 out of 10 with activity. Patient has night pain, worsening of pain with activity and weight bearing, pain that interferes with activities of daily living, crepitus and joint swelling.  Patient has evidence of subchondral sclerosis, periarticular osteophytes and joint space narrowing by imaging studies. . There is no active infection.  Patient Active Problem List   Diagnosis Date Noted  . Thyroid disease   . Hypertension   . Hypercholesteremia   . Heart attack   . Left knee DJD    Past Medical History  Diagnosis Date  . Thyroid disease   . Hypertension   . Hypercholesteremia   . Heart attack 2001    Stent  . Left knee DJD     Past Surgical History  Procedure Date  . Coronary angioplasty with stent placement 2001  . Cholecystectomy, laparoscopic   . Shoulder arthroscopy with rotator cuff repair and subacromial decompression 2005  . Coronary angioplasty      (Not in a hospital admission) No Known Allergies   Current Outpatient Prescriptions on File Prior to Visit  Medication Sig Dispense Refill  . acetaminophen (TYLENOL) 325 MG tablet Take 650 mg by mouth every 6 (six) hours as needed. For pain      . aspirin EC 81 MG tablet Take 81 mg by mouth every morning.        . atenolol (TENORMIN) 25 MG tablet Take 12.5 mg by mouth daily.      . levothyroxine (SYNTHROID, LEVOTHROID) 25 MCG tablet Take 25 mcg by mouth daily.      . losartan (COZAAR) 50 MG tablet Take 50 mg by mouth every morning.      . Multiple Vitamin (MULTIVITAMIN WITH MINERALS) TABS Take 1 tablet by mouth daily.      . naproxen sodium (ANAPROX) 220 MG tablet Take 220 mg by mouth 2 (two) times daily as needed. For pain      . omega-3 acid ethyl esters (LOVAZA) 1 G capsule Take 1 g by mouth 2 (two) times daily.      . rosuvastatin (CRESTOR) 20 MG tablet Take 20 mg by mouth at bedtime.      . Tamsulosin HCl (FLOMAX) 0.4 MG CAPS Take 0.4 mg by mouth daily after supper.         History  Substance Use Topics  . Smoking status: Never Smoker   . Smokeless tobacco: Not on file  . Alcohol Use: No    Family History  Problem Relation Age of Onset  . Heart attack Mother   . Heart disease Brother   . Cancer Brother   . Cancer - Lung Brother     . Review of Systems  Constitutional: Negative.   HENT: Negative.   Eyes: Negative.   Respiratory: Negative.   Cardiovascular: Negative.   Gastrointestinal: Negative.   Genitourinary: Negative.     Musculoskeletal: Positive for back pain and joint pain.  Skin: Negative.   Neurological: Negative.   Endo/Heme/Allergies: Negative.   Psychiatric/Behavioral: Negative.     Objective:  Physical Exam  Constitutional: He is oriented to person, place, and time. He appears well-developed and well-nourished.  HENT:  Head: Normocephalic and atraumatic.  Mouth/Throat: Oropharynx is clear and moist.  Eyes: Conjunctivae normal and EOM are normal. Pupils are equal, round, and reactive to light.  Neck: Normal range of motion.  Cardiovascular: Normal rate, regular rhythm and normal heart sounds.   Respiratory: Effort normal and breath sounds normal.  GI: Soft. Bowel sounds are normal.  Genitourinary:       Not pertinent to current symptomatology therefore not  examined.  Musculoskeletal: Normal range of motion.  Neurological: He is alert and oriented to person, place, and time.  Skin: Skin is warm and dry.  Psychiatric: He has a normal mood and affect. His behavior is normal. Judgment and thought content normal.    Vital signs in last 24 hours: Last recorded: 11/20 1300   BP: 154/85 Pulse: 63  Temp: 97.9 F (36.6 C)    Height: 6' (1.829 m) SpO2: 96  Weight: 108.863 kg (240 lb)     Labs:   Estimated Body mass index is 32.55 kg/(m^2) as calculated from the following:   Height as of this encounter: 6' 0"(1.829 m).   Weight as of this encounter: 240 lb(108.863 kg).   Imaging Review Plain radiographs demonstrate severe degenerative joint disease of the left knee(s). The overall alignment issignificant varus. The bone quality appears to be good for age and reported activity level.  Assessment/Plan:  End stage arthritis, left knee  Patient Active Problem List  Diagnosis  . Thyroid disease  . Hypertension  . Hypercholesteremia  . Heart attack  . Left knee DJD    The patient history, physical examination, clinical judgment of the provider and imaging studies are consistent with end stage degenerative joint disease of the left knee(s) and total knee arthroplasty is deemed medically necessary. The treatment options including medical management, injection therapy arthroscopy and arthroplasty were discussed at length. The risks and benefits of total knee arthroplasty were presented and reviewed. The risks due to aseptic loosening, infection, stiffness, patella tracking problems, thromboembolic complications and other imponderables were discussed. The patient acknowledged the explanation, agreed to proceed with the plan and consent was signed. Patient is being admitted for inpatient treatment for surgery, pain control, PT, OT, prophylactic antibiotics, VTE prophylaxis, progressive ambulation and ADL's and discharge planning. The patient is  planning to be discharged home with home health services  Evelynn Hench A. Treyson Axel, PA-C Physician Assistant [facility] [phone]  06/15/2012, 2:14 PM 

## 2012-06-27 NOTE — Anesthesia Preprocedure Evaluation (Addendum)
Anesthesia Evaluation  Patient identified by MRN, date of birth, ID band Patient awake    Reviewed: Allergy & Precautions, H&P , NPO status , Patient's Chart, lab work & pertinent test results, reviewed documented beta blocker date and time   Airway Mallampati: II TM Distance: >3 FB Neck ROM: full    Dental  (+) Teeth Intact and Dental Advidsory Given   Pulmonary          Cardiovascular hypertension, Pt. on home beta blockers and Pt. on medications + Past MI (2001 - no CP since stent placement) and + Cardiac Stents     Neuro/Psych    GI/Hepatic   Endo/Other  Hypothyroidism   Renal/GU      Musculoskeletal   Abdominal   Peds  Hematology   Anesthesia Other Findings   Reproductive/Obstetrics                          Anesthesia Physical Anesthesia Plan  ASA: III  Anesthesia Plan: General and Regional   Post-op Pain Management: MAC Combined w/ Regional for Post-op pain   Induction: Intravenous  Airway Management Planned: LMA  Additional Equipment:   Intra-op Plan:   Post-operative Plan:   Informed Consent: I have reviewed the patients History and Physical, chart, labs and discussed the procedure including the risks, benefits and alternatives for the proposed anesthesia with the patient or authorized representative who has indicated his/her understanding and acceptance.     Plan Discussed with: CRNA and Surgeon  Anesthesia Plan Comments:         Anesthesia Quick Evaluation

## 2012-06-27 NOTE — Brief Op Note (Signed)
06/27/2012  9:11 AM  PATIENT:  [name]  66 y.o. male  PRE-OPERATIVE DIAGNOSIS:  LEFT KNEE DJD  POST-OPERATIVE DIAGNOSIS:  LEFT KNEE DJD  PROCEDURE:  Procedure(s) (LRB) with comments: UNICOMPARTMENTAL KNEE (Left) - left unicompartmental knee  SURGEON:  Surgeon(s) and Role:    * Nilda Simmer, MD - Primary    * Eulas Post, MD - Assisting  PHYSICIAN ASSISTANT: Kirstin Shepperson PA-C  ASSISTANTS:Joshua Dion Saucier MD, Kirstin Shepperson PA-C  ANESTHESIA:   general  EBL:  Total I/O In: 1000 [I.V.:1000] Out: -   BLOOD ADMINISTERED:none  DRAINS: (1) Hemovact drain(s) in the left knee with  Suction Clamped   LOCAL MEDICATIONS USED:  NONE  SPECIMEN:  No Specimen  DISPOSITION OF SPECIMEN:  N/A  COUNTS:  YES  TOURNIQUET:  * Missing tourniquet times found for documented tourniquets in log:  16109 *  DICTATION: .Note written in EPIC  PLAN OF CARE: Admit to inpatient   PATIENT DISPOSITION:  PACU - hemodynamically stable.   Delay start of Pharmacological VTE agent (>24hrs) due to surgical blood loss or risk of bleeding: no

## 2012-06-27 NOTE — Progress Notes (Signed)
UR COMPLETED  

## 2012-06-27 NOTE — Anesthesia Procedure Notes (Addendum)
Procedure Name: LMA Insertion Date/Time: 06/27/2012 7:23 AM Performed by: Rossie Muskrat L Pre-anesthesia Checklist: Patient identified, Timeout performed, Emergency Drugs available, Suction available and Patient being monitored Patient Re-evaluated:Patient Re-evaluated prior to inductionOxygen Delivery Method: Circle system utilized Preoxygenation: Pre-oxygenation with 100% oxygen Intubation Type: IV induction Ventilation: Mask ventilation without difficulty LMA: LMA inserted LMA Size: 5.0 Number of attempts: 1 Placement Confirmation: positive ETCO2 and breath sounds checked- equal and bilateral Tube secured with: Tape Dental Injury: Teeth and Oropharynx as per pre-operative assessment    Anesthesia Regional Block:  Femoral nerve block  Pre-Anesthetic Checklist: ,, timeout performed, Correct Patient, Correct Site, Correct Laterality, Correct Procedure,, site marked, risks and benefits discussed, Surgical consent,  Pre-op evaluation,  At surgeon's request and post-op pain management  Laterality: Left  Prep: chloraprep       Needles:  Injection technique: Single-shot  Needle Type: Echogenic Stimulator Needle     Needle Length: 9cm  Needle Gauge: 21    Additional Needles:  Procedures: nerve stimulator Femoral nerve block  Nerve Stimulator or Paresthesia:  Response: Quadriceps muscle contraction, 0.45 mA,   Additional Responses:   Narrative:  Start time: 06/27/2012 7:02 AM End time: 06/27/2012 7:11 AM Injection made incrementally with aspirations every 5 mL.  Performed by: Personally  Anesthesiologist: Dr Chaney Malling  Additional Notes: Functioning IV was confirmed and monitors were applied.  A 90mm 21ga Arrow echogenic stimulator needle was used. Sterile prep and drape,hand hygiene and sterile gloves were used.  Negative aspiration and negative test dose prior to incremental administration of local anesthetic. The patient tolerated the procedure well.    Femoral nerve  block

## 2012-06-27 NOTE — Preoperative (Signed)
Beta Blockers   Reason not to administer Beta Blockers:B blocker taken 06/27/12 @ 0500

## 2012-06-27 NOTE — Transfer of Care (Signed)
Immediate Anesthesia Transfer of Care Note  Patient: Brandon Soto  Procedure(s) Performed: Procedure(s) (LRB) with comments: UNICOMPARTMENTAL KNEE (Left) - left unicompartmental knee  Patient Location: PACU  Anesthesia Type:General  Level of Consciousness: awake, alert  and oriented  Airway & Oxygen Therapy: Patient Spontanous Breathing and Patient connected to nasal cannula oxygen  Post-op Assessment: Report given to PACU RN and Post -op Vital signs reviewed and stable  Post vital signs: Reviewed and stable  Complications: No apparent anesthesia complications

## 2012-06-27 NOTE — Progress Notes (Signed)
Report given to Kay RN

## 2012-06-27 NOTE — Interval H&P Note (Signed)
History and Physical Interval Note:  06/27/2012 7:02 AM  Brandon Soto  has presented today for surgery, with the diagnosis of LT KNEE DJD  The various methods of treatment have been discussed with the patient and family. After consideration of risks, benefits and other options for treatment, the patient has consented to  Procedure(s) (LRB) with comments: UNICOMPARTMENTAL KNEE (Left) as a surgical intervention .  The patient's history has been reviewed, patient examined, no change in status, stable for surgery.  I have reviewed the patient's chart and labs.  Questions were answered to the patient's satisfaction.     Salvatore Marvel A

## 2012-06-27 NOTE — Progress Notes (Signed)
Orthopedic Tech Progress Note Patient Details:  Brandon Soto 23-Dec-1945 [id number]  CPM Left Knee CPM Left Knee: On Left Knee Flexion (Degrees): 60  Left Knee Extension (Degrees): 0  Additional Comments: trapeze bar   Shawnie Pons 06/27/2012, 12:03 PM

## 2012-06-27 NOTE — Anesthesia Postprocedure Evaluation (Signed)
Anesthesia Post Note  Patient: Brandon Soto  Procedure(s) Performed: Procedure(s) (LRB): UNICOMPARTMENTAL KNEE (Left)  Anesthesia type: General  Patient location: PACU  Post pain: Pain level controlled and Adequate analgesia  Post assessment: Post-op Vital signs reviewed, Patient's Cardiovascular Status Stable, Respiratory Function Stable, Patent Airway and Pain level controlled  Last Vitals:  Filed Vitals:   06/27/12 0939  BP: 112/54  Pulse: 69  Temp: 36 C  Resp: 15    Post vital signs: Reviewed and stable  Level of consciousness: awake, alert  and oriented  Complications: No apparent anesthesia complications

## 2012-06-28 ENCOUNTER — Encounter (HOSPITAL_COMMUNITY): Payer: Self-pay | Admitting: *Deleted

## 2012-06-28 LAB — BASIC METABOLIC PANEL
BUN: 12 mg/dL (ref 6–23)
CO2: 28 mEq/L (ref 19–32)
Calcium: 9 mg/dL (ref 8.4–10.5)
GFR calc non Af Amer: 90 mL/min — ABNORMAL LOW (ref >90)
Glucose, Bld: 130 mg/dL — ABNORMAL HIGH (ref 70–99)

## 2012-06-28 LAB — CBC
Platelets: 153 10*3/uL (ref 150–400)
RDW: 12.4 % (ref 11.5–15.5)
WBC: 10 10*3/uL (ref 4.0–10.5)

## 2012-06-28 MED ORDER — DSS 100 MG PO CAPS: ORAL_CAPSULE | ORAL | Status: DC

## 2012-06-28 MED ORDER — OXYCODONE HCL 5 MG PO TABS: ORAL_TABLET | ORAL | Status: DC

## 2012-06-28 MED ORDER — ENOXAPARIN SODIUM 30 MG/0.3ML ~~LOC~~ SOLN: 30.0000 mg | Freq: Two times a day (BID) | SUBCUTANEOUS | Status: DC

## 2012-06-28 MED ORDER — BISACODYL 5 MG PO TBEC: DELAYED_RELEASE_TABLET | ORAL | Status: DC

## 2012-06-28 NOTE — Evaluation (Signed)
Occupational Therapy Evaluation Patient Details Name: Brandon Soto MRN: [id number] DOB: [birth year] Today's Date: 06/28/2012 Time: 4098-1191 OT Time Calculation (min): 41 min  OT Assessment / Plan / Recommendation Clinical Impression  66 yo male s/p LT TKA that could benefit from skilled OT acutely. Recommend HHOT for d/c home.     OT Assessment  Patient needs continued OT Services    Follow Up Recommendations  Home health OT    Barriers to Discharge      Equipment Recommendations  None recommended by OT    Recommendations for Other Services    Frequency  Min 2X/week    Precautions / Restrictions Precautions Precautions: Knee Precaution Booklet Issued: Yes (comment) Required Braces or Orthoses: Knee Immobilizer - Left Knee Immobilizer - Left: On except when in CPM;Discontinue once straight leg raise with < 10 degree lag Restrictions Weight Bearing Restrictions: Yes LLE Weight Bearing: Weight bearing as tolerated   Pertinent Vitals/Pain 5 out 10 pain Pt c/o pain s/p fall at back but then says "I'm okay"    ADL  Eating/Feeding: Set up Where Assessed - Eating/Feeding: Chair Toilet Transfer: Radiographer, therapeutic Method: Sit to Barista: Raised toilet seat with arms (or 3-in-1 over toilet) Tub/Shower Transfer: Other (comment) (pt completed transfer in Min (A)) Tub/Shower Transfer Method: Ambulating (Pt with Rt knee buckle required total+2 off floor) Tub/Shower Transfer Equipment: Walk in shower Equipment Used: Gait belt;Rolling walker;Knee Immobilizer Transfers/Ambulation Related to ADLs: Pt completed basic transfer from chair to standing with RW. Pt completed ~5 steps to start shower simulation.See adl section ADL Comments: Pt verbalized to OT the sequence used during step transfer with PT this AM. Pt 100% correct recall. OT educated patient and wife on how to use same sequence for shower transfer with demonstration. Pt then completed  sit<>stand and positioned self to step over pink spit basin to simulate shower rim. Pt completed transfer with non operated leg into shower followed by operated. WHen completing shower transfer back out of the shower pt attempted to exit using the non operated side. OT max v/c to correct sequence before progressing. Pt attempted to correct and operated leg buckled. PT was assisted back to upright posture and then operated leg buckled again and pt shifted all weight onto operated side. Therapist then used the wall to assist patient to floor and grabbed knee immobilizer to keep operated leg extended as much as possible for declined to floor. Pt positioned on floor in safe position. Pt with obvious bend to KI after fall so new KI order and PA called by RN. OT spoke directly to PA regarding fall with details. PT (A)ed off the floor by RN and OT. PT positioned in chair with operated knee extended and ice applied. RN Teacher, early years/pre in room addressing fall with family. OT spoke to patient and family and will reattempt session in PM. Pt and wife apologizing at this time. Pt expressed embarrassment over fall. Pt ensured that it will get stronger and that we will practice again to make sure everyone feels safe before discharge.    OT Diagnosis: Generalized weakness;Acute pain  OT Problem List: Decreased strength;Decreased activity tolerance;Impaired balance (sitting and/or standing);Decreased safety awareness;Decreased knowledge of use of DME or AE;Decreased knowledge of precautions;Pain OT Treatment Interventions: Self-care/ADL training;DME and/or AE instruction;Therapeutic activities;Patient/family education;Balance training   OT Goals Acute Rehab OT Goals OT Goal Formulation: With patient/family Time For Goal Achievement: 07/12/12 Potential to Achieve Goals: Good ADL Goals Pt Will Transfer to Toilet: with  modified independence;Ambulation;with DME ADL Goal: Toilet Transfer - Progress: Goal set today Pt Will  Perform Toileting - Clothing Manipulation: with modified independence;Sitting on 3-in-1 or toilet ADL Goal: Toileting - Clothing Manipulation - Progress: Goal set today Pt Will Perform Toileting - Hygiene: with modified independence;Sit to stand from 3-in-1/toilet ADL Goal: Toileting - Hygiene - Progress: Goal set today Pt Will Perform Tub/Shower Transfer: with min assist;Ambulation;with DME ADL Goal: Tub/Shower Transfer - Progress: Goal set today Miscellaneous OT Goals Miscellaneous OT Goal #1: Pt will complete bed mobility MOD I with HOB flat no rails as precursor to adls OT Goal: Miscellaneous Goal #1 - Progress: Goal set today  Visit Information  Last OT Received On: 06/28/12 Assistance Needed: +2 (safety due to buckling)    Subjective Data  Subjective: "I just feel embarrassed now"- pt response after falling Patient Stated Goal: to go home with son (A)ing   Prior Functioning     Home Living Lives With: Spouse Available Help at Discharge: Family;Available 24 hours/day Type of Home: House Home Access: Stairs to enter [employer] of Steps: 1 Entrance Stairs-Rails: None Home Layout: One level Bathroom Shower/Tub: Health visitor: Handicapped height Bathroom Accessibility: Yes How Accessible: Accessible via walker Home Adaptive Equipment: Walker - rolling;Straight cane;Crutches Prior Function Level of Independence: Independent Able to Take Stairs?: Yes Driving: Yes Vocation: Retired Musician: No difficulties Dominant Hand: Right         Vision/Perception     Cognition  Overall Cognitive Status: Appears within functional limits for tasks assessed/performed Arousal/Alertness: Awake/alert Orientation Level: Appears intact for tasks assessed Behavior During Session: [facility] for tasks performed    Extremity/Trunk Assessment Right Upper Extremity Assessment RUE ROM/Strength/Tone: Within functional levels Left Upper  Extremity Assessment LUE ROM/Strength/Tone: Within functional levels Left Lower Extremity Assessment LLE ROM/Strength/Tone: Deficits;Unable to fully assess Trunk Assessment Trunk Assessment: Normal     Mobility Bed Mobility Bed Mobility: Not assessed Transfers Sit to Stand: 5: Supervision;With upper extremity assist;From chair/3-in-1 Stand to Sit: 5: Supervision;With upper extremity assist;To chair/3-in-1 Details for Transfer Assistance: pt completed sequence with excellent hand placement           Balance     End of Session OT - End of Session Activity Tolerance: Patient tolerated treatment well Patient left: in chair;with call bell/phone within reach Nurse Communication: Mobility status;Precautions CPM Left Knee CPM Left Knee: On Left Knee Flexion (Degrees): 65  Left Knee Extension (Degrees): 0   GO     Harrel Carina [facility] 06/28/2012, 11:30 AM

## 2012-06-28 NOTE — Progress Notes (Signed)
CARE MANAGEMENT NOTE 06/28/2012  Patient:  Brandon Soto, Brandon Soto   Account Number:  [id number]  Date Initiated:  06/28/2012  Documentation initiated by:  Vance Peper  Subjective/Objective Assessment:   66 yr old male s/p left total knee arthroplasty.     Action/Plan:   patient preoperatively setup with [facility], no changes. patient has rolling walker, 3in1 and CPM to be delivered to his home. Has family support at home.   Anticipated DC Date:  06/28/2012   Anticipated DC Plan:  HOME W HOME HEALTH SERVICES      DC Planning Services  CM consult      [facility] Choice  HOME HEALTH   Choice offered to / List presented to:  C-1 Patient   DME arranged  3-N-1  CPM      DME agency  [employer]     HH arranged  HH-2 PT      [facility] agency  [facility]   Status of service:  Completed, signed off Medicare Important Message given?   (If response is "NO", the following Medicare IM given date fields will be blank) Date Medicare IM given:   Date Additional Medicare IM given:    Discharge Disposition:  HOME W HOME HEALTH SERVICES  Per UR Regulation:    If discussed at Long Length of Stay Meetings, dates discussed:

## 2012-06-28 NOTE — Progress Notes (Signed)
OT NOTE  Pt with fall at 10:50AM during OT evaluation.  During simulated shower demonstration pt experienced an assisted fall to the floor. Pt attempted to step with non operated RT LE first and LT operated LE buckled in [place]. Pt returned to upright posture and experienced second buckle LT LE and required (A) using wall to guide fall safely to the floor. RN Georga Hacking called to room immediately. A team huddle was completed outside the room by director/ assistant director at 11AM. Pt reports 5 out 10 pain in back s/p fall. Pt repositioned in chair with Lt LE extended and ice applied. PA Shepperson called and notified.  OT to see patient a second session in PM   Lucile Shutters   OTR/L Pager: [phone] Office: [phone] .

## 2012-06-28 NOTE — Discharge Summary (Signed)
Patient ID: Brandon Soto MRN: [id number] DOB/AGE: 10-15-1945 66 y.o.  Admit date: 06/27/2012 Discharge date: 06/28/2012  Admission Diagnoses:  Principal Problem:  *Left knee DJD Active Problems:  Thyroid disease  Hypertension  Hypercholesteremia  Heart attack   Discharge Diagnoses:  Same  Past Medical History  Diagnosis Date  . Hypertension   . Hypercholesteremia   . H/O dizziness   . BPH (benign prostatic hyperplasia)   . PONV (postoperative nausea and vomiting)   . Coronary artery disease   . Heart attack 2001  . Hypothyroidism     "shrank w/radiation ?1990's" (06/27/2012)  . GERD (gastroesophageal reflux disease)   . Left knee DJD   . Rheumatoid arthritis     "all over; hands especially" (06/27/2012)    Surgeries: Procedure(s): UNICOMPARTMENTAL KNEE on 06/27/2012   Consultants:    Discharged Condition: Improved  Hospital Course: Brandon Soto is an 66 y.o. male who was admitted 06/27/2012 for operative treatment ofLeft knee DJD. Patient has severe unremitting pain that affects sleep, daily activities, and work/hobbies. After pre-op clearance the patient was taken to the operating room on 06/27/2012 and underwent  Procedure(s): UNICOMPARTMENTAL KNEE.    Patient was given perioperative antibiotics: Anti-infectives     Start     Dose/Rate Route Frequency Ordered Stop   06/27/12 1400   ceFAZolin (ANCEF) IVPB 2 g/50 mL premix        2 g 100 mL/hr over 30 Minutes Intravenous Every 6 hours 06/27/12 1156 06/27/12 2030   06/27/12 0846   cefUROXime (ZINACEF) injection  Status:  Discontinued          As needed 06/27/12 0846 06/27/12 0934   06/26/12 1149   ceFAZolin (ANCEF) 3 g in dextrose 5 % 50 mL IVPB        3 g 160 mL/hr over 30 Minutes Intravenous 60 min pre-op 06/26/12 1149 06/27/12 0728           Patient was given sequential compression devices, early ambulation, and chemoprophylaxis to prevent DVT.  Patient benefited maximally from hospital stay and there  were no complications.    Recent vital signs: Patient Vitals for the past 24 hrs:  BP Temp Pulse Resp SpO2  06/28/12 0600 110/57 mmHg 98.3 F (36.8 C) 58  16  99 %  06/27/12 2142 105/57 mmHg 98.5 F (36.9 C) 65  16  95 %  06/27/12 1136 129/78 mmHg 97.9 F (36.6 C) 63  18  100 %  06/27/12 1115 134/82 mmHg 97.5 F (36.4 C) 64  17  100 %  06/27/12 0939 112/54 mmHg 96.8 F (36 C) 69  15  96 %     Recent laboratory studies:  [facility] 06/28/12 0615  WBC 10.0  HGB 12.9*  HCT 37.8*  PLT 153  NA 137  K 4.2  CL 102  CO2 28  BUN 12  CREATININE 0.81  GLUCOSE 130*  INR --  CALCIUM 9.0     Discharge Medications:     Medication List     As of 06/28/2012  8:33 AM    STOP taking these medications         naproxen sodium 220 MG tablet   Commonly known as: ANAPROX      omega-3 acid ethyl esters 1 G capsule   Commonly known as: LOVAZA      TAKE these medications         acetaminophen 325 MG tablet   Commonly known as: TYLENOL   Take 650  mg by mouth every 6 (six) hours as needed. For pain      aspirin EC 81 MG tablet   Take 81 mg by mouth every morning.      atenolol 25 MG tablet   Commonly known as: TENORMIN   Take 12.5 mg by mouth daily.      bisacodyl 5 MG EC tablet   Commonly known as: DULCOLAX   2 tablets each night with dinner until bowel movement.  LAXITIVE.  Restart again after first bowel movement if it has been 2 days since last bowel movement      DSS 100 MG Caps   1 tab 2 times a day while on narcotics.  STOOL SOFTENER      enoxaparin 30 MG/0.3ML injection   Commonly known as: LOVENOX   Inject 0.3 mLs (30 mg total) into the skin every 12 (twelve) hours.      levothyroxine 25 MCG tablet   Commonly known as: SYNTHROID, LEVOTHROID   Take 25 mcg by mouth daily.      losartan 50 MG tablet   Commonly known as: COZAAR   Take 50 mg by mouth every morning.      multivitamin with minerals Tabs   Take 1 tablet by mouth daily.      oxyCODONE 5 MG  immediate release tablet   Commonly known as: Oxy IR/ROXICODONE   1-2 tablets every 4-6 hrs as needed for pain      rosuvastatin 20 MG tablet   Commonly known as: CRESTOR   Take 20 mg by mouth at bedtime.      Tamsulosin HCl 0.4 MG Caps   Commonly known as: FLOMAX   Take 0.4 mg by mouth daily after supper.        Diagnostic Studies: Dg Chest 2 View  06/20/2012  *RADIOLOGY REPORT*  Clinical Data: Preop for knee replacement.  CHEST - 2 VIEW  Comparison: None.  Findings: Lungs are adequately inflated without consolidation or effusion.  The cardiomediastinal silhouette is within normal. There is minimal spondylosis of the spine.  IMPRESSION: No acute cardiopulmonary disease.   Original Report Authenticated By: Elberta Fortis, M.D.     Disposition:       Discharge Orders    Future Orders Please Complete By Expires   Diet - low sodium heart healthy      Call MD / Call 911      Comments:   If you experience chest pain or shortness of breath, CALL 911 and be transported to the hospital emergency room.  If you develope a fever above 101 F, pus (white drainage) or increased drainage or redness at the wound, or calf pain, call your surgeon's office.   Discharge instructions      Comments:   Partial Knee Replacement Care After Refer to this sheet in the next few weeks. These discharge instructions provide you with general information on caring for yourself after you leave the hospital. Your caregiver may also give you specific instructions. Your treatment has been planned according to the most current medical practices available, but unavoidable complications sometimes occur. If you have any problems or questions after discharge, please call your caregiver. Regaining a near full range of motion of your knee within the first 3 to 6 weeks after surgery is critical. HOME CARE INSTRUCTIONS  You may resume a normal diet and activities as directed.  Perform exercises as directed.  Place yellow foam  block, yellow side up under heel at all times except  when in CPM or when walking.  DO NOT modify, tear, cut, or change in any way. You will receive physical therapy daily  Take showers instead of baths until informed otherwise.  Change bandages (dressings)daily Do not take over-the-counter or prescription medicines for pain, discomfort, or fever. Eat a well-balanced diet.  Avoid lifting or driving until you are instructed otherwise.  Make an appointment to see your caregiver for stitches (suture) or staple removal as directed.  If you have been sent home with a continuous passive motion machine (CPM machine), 0-90 degrees 6 hrs a day   2 hrs a shift SEEK MEDICAL CARE IF: You have swelling of your calf or leg.  You develop shortness of breath or chest pain.  You have redness, swelling, or increasing pain in the wound.  There is pus or any unusual drainage coming from the surgical site.  You notice a bad smell coming from the surgical site or dressing.  The surgical site breaks open after sutures or staples have been removed.  There is persistent bleeding from the suture or staple line.  You are getting worse or are not improving.  You have any other questions or concerns.  SEEK IMMEDIATE MEDICAL CARE IF:  You have a fever.  You develop a rash.  You have difficulty breathing.  You develop any reaction or side effects to medicines given.  Your knee motion is decreasing rather than improving.  MAKE SURE YOU:  Understand these instructions.  Will watch your condition.  Will get help right away if you are not doing well or get worse.   Constipation Prevention      Comments:   Drink plenty of fluids.  Prune juice may be helpful.  You may use a stool softener, such as Colace (over the counter) 100 mg twice a day.  Use MiraLax (over the counter) for constipation as needed.   Increase activity slowly as tolerated      CPM      Comments:   Continuous passive motion machine (CPM):      Use  the CPM from 0 to 90 for 6 hours per day.       You may break it up into 2 or 3 sessions per day.      Use CPM for 2 weeks or until you are told to stop.   TED hose      Comments:   Use stockings (TED hose) for 2 weeks on both leg(s).  You may remove them at night for sleeping.   Change dressing      Comments:   Change the dressing daily with sterile 4 x 4 inch gauze dressing and apply TED hose.  You may clean the incision with alcohol prior to redressing.   Do not put a pillow under the knee. Place it under the heel.      Comments:   Place yellow foam block, yellow side up under heel at all times except when in CPM or when walking.  DO NOT modify, tear, cut, or change in any way the yellow foam block.      Follow-up Information    Follow up with Nilda Simmer, MD. On 07/04/2012. (appt at 3:30)    Contact information:   [address] [place] [address] [phone]           Signed: Pascal Lux 06/28/2012, 8:33 AM

## 2012-06-28 NOTE — Op Note (Signed)
Brandon Soto, CARN NO.:  [id number]  MEDICAL RECORD NO.:  [id number]  LOCATION:  5N13C                        FACILITY:  [facility]  PHYSICIAN:  Elana Alm. Thurston Soto, M.D. 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  PROCEDURE: 1. Left knee medial compartment Oxford unicompartmental arthroplasty     using left medium cemented femoral component with size C cemented     tibial component with #4 polyethylene spacer. 2. Zinacef impregnated cement.  SURGEON:  Elana Alm. Thurston Hole, MD  ASSISTANT:  Eulas Post, MD and Julien Girt, [place]  ANESTHESIA:  General.  OPERATIVE TIME:  One hour and 20 minutes.  COMPLICATIONS:  None.  DESCRIPTION OF PROCEDURE:  Mr. Bither was brought to the operating room on June 27, 2012 after a femoral nerve block was placed in the holding room by Anesthesia.  He was placed on the operative table in supine position.  He received Ancef 2 g IV preoperatively for prophylaxis. After being placed under general anesthesia, his left knee was examined. Range of motion from -5 to 125 degrees, mild varus deformity, knee stable, ligamentous exam with normal patellar tracking.  He had a Foley catheter placed under sterile conditions.  His left leg was then prepped using sterile DuraPrep and draped using sterile technique.  Time-out procedure was called and the correct left knee identified.  Left leg was exsanguinated and a thigh tourniquet elevated 365 mm.  Initially, through a 7 cm anteromedial incision just medial to the patellar tendon, initial exposure was made.  The underlying subcutaneous tissues were incised along with skin incision.  Median arthrotomy was performed revealing excessive amount  of normal-appearing joint fluid.  The articular surfaces were inspected.  He had grade 4 changes medially.  He had grade 2 and 25% grade 3 changes in the patellofemoral joint and no degenerative changes laterally.  The anterior cruciate ligament and posterior cruciate ligaments were intact as well as the MCL and PCL.  At this point, it was felt he was a excellent candidate for a medial compartment arthroplasty.  At this point, the medial meniscus was resected.  A medium sizing spoon was placed in the medial compartment and then the proximal tibial jig was attached to this and the proximal tibial resection cut was made based off of this medial sizing spoon, resecting 7 mm off of the distal femoral cartilage.  After this was removed with the MCL being protected, this was sized and a size C like [place] was felt to be the appropriate size.  After this was done, then intramedullary rod was placed up the femoral canal for placement of the intramedullary femoral guide which was linked to the rod and 2 drill holes were placed through the femoral guide and the posterior cutting jig was then placed after this guide was removed and the posterior femoral cut was made.  After this was done, then the 0 spigot was placed on the distal femur and  the initial milling was carried out.  After this was done, then with the size C tibial base plate in place and the medium femoral trial, a #4 paddle size was placed in 90 degrees of flexion, which was found to give excellent stability, but in 20 degrees of flexion and only a 1 mm sizer could be placed and thus with this calculation, a 3 spigot was then placed and the milling was further carried out.  After this was done, then the trials were placed again and the #4 trial was placed in both flexion and extension and found to give excellent stability and excellent correction of his varus deformities. The tibial keel was then cut with the tooth brush saw and then  the impingement femoral guide was placed and these cuts were made as well. After this was done, then with the trials placed back in place and the #4 polyethylene spacer, the knee was taken through full range of motion and found to be stable with excellent tracking and excellent correction of his flexion and varus deformities.  At this point, it is felt that all the trial components were of excellent size, fit, and stability. They were then removed.  The knee was then jet lavage irrigated with 3 L of saline and then the size C tibial base plate with cement backing was hammered in position with an excellent fit with excess cement being removed from the edges.  The medium femoral component with cement backing was hammered in position also with an excellent fit with excess cement being removed from around the edges.  The #4 polyethylene spacer was then placed in position, knee brought through a full range of motion and found to be stable with excellent tracking with full range of motion.  At this point, it was felt that all the components were of excellent size, fit, and stability.  The wound was further irrigated with saline and then the arthrotomy was closed with running #1 Ethibond suture for the arthrotomy over 1 Hemovac drain.  Subcutaneous tissue was closed 0 and 2-0 Vicryl, subcuticular layer closed with 4-0 Monocryl. Sterile dressings were applied and a long-leg splint.  Tourniquet was released and then the patient awakened, extubated, and taken to recovery room in stable condition.  Needle and sponge count was correct x2 at the end of the case.     Brandon Soto, M.D.     RAW/MEDQ  D:  06/27/2012  T:  06/28/2012  Job:  [id number]

## 2012-06-28 NOTE — Progress Notes (Signed)
Physical Therapy Treatment Patient Details Name: Brandon Soto MRN: [id number] DOB: April 26, 1946 Today's Date: 06/28/2012 Time: 4696-2952 PT Time Calculation (min): 27 min  PT Assessment / Plan / Recommendation Comments on Treatment Session  Patient is progrfessing well with mobility. Would like to see patient again prior to DC and practice step again so patient and wife are more confident    Follow Up Recommendations  Home health PT;Supervision/Assistance - 24 hour     Does the patient have the potential to tolerate intense rehabilitation     Barriers to Discharge        Equipment Recommendations  None recommended by PT    Recommendations for Other Services    Frequency 7X/week   Plan Discharge plan remains appropriate;Frequency remains appropriate    Precautions / Restrictions Precautions Required Braces or Orthoses: Knee Immobilizer - Left Restrictions Weight Bearing Restrictions: Yes LLE Weight Bearing: Weight bearing as tolerated   Pertinent Vitals/Pain     Mobility  Bed Mobility Bed Mobility: Not assessed Transfers Sit to Stand: 5: Supervision;From chair/3-in-1 Stand to Sit: 5: Supervision;To chair/3-in-1 Ambulation/Gait Ambulation/Gait Assistance: 4: Min guard Ambulation Distance (Feet): 250 Feet Assistive device: Rolling walker Ambulation/Gait Assistance Details: Cues for positioning within Rw and for posture Gait Pattern: Step-to pattern;Step-through pattern;Decreased stride length;Trunk flexed Stairs: Yes Stairs Assistance: 4: Min assist Stairs Assistance Details (indicate cue type and reason): A for balance. Cues for technique Stair Management Technique: Step to pattern;Backwards;With walker Number of Stairs: 2     Exercises Total Joint Exercises Quad Sets: AROM;Left;10 reps Heel Slides: AROM;Left;10 reps Straight Leg Raises: AAROM;Left;10 reps   PT Diagnosis:    PT Problem List:   PT Treatment Interventions:     PT Goals Acute Rehab PT Goals PT  Goal: Sit to Supine/Side - Progress: Met PT Goal: Sit to Stand - Progress: Met PT Goal: Ambulate - Progress: Progressing toward goal PT Goal: Up/Down Stairs - Progress: Progressing toward goal PT Goal: Perform Home Exercise Program - Progress: Progressing toward goal  Visit Information  Last PT Received On: 06/28/12 Assistance Needed: +1    Subjective Data      Cognition  Overall Cognitive Status: Appears within functional limits for tasks assessed/performed Arousal/Alertness: Awake/alert Orientation Level: Appears intact for tasks assessed Behavior During Session: [facility] for tasks performed    Balance     End of Session PT - End of Session Equipment Utilized During Treatment: Gait belt;Left knee immobilizer Activity Tolerance: Patient tolerated treatment well Patient left: in chair;with call bell/phone within reach Nurse Communication: Mobility status CPM Left Knee CPM Left Knee: On Left Knee Flexion (Degrees): 65  Left Knee Extension (Degrees): 0    GP     Fredrich Birks 06/28/2012, 9:56 AM  06/28/2012 Fredrich Birks PTA [phone] pager [phone] office

## 2012-06-28 NOTE — Progress Notes (Signed)
Lovenox teaching done with pt and Pts wife.

## 2012-06-28 NOTE — Progress Notes (Signed)
Physical Therapy Treatment Patient Details Name: Brandon Soto MRN: [id number] DOB: August 24, 1945 Today's Date: 06/28/2012 Time: 0865-7846 PT Time Calculation (min): 29 min  PT Assessment / Plan / Recommendation Comments on Treatment Session  Patient progressing well with ambulation and stairs this session. Eager to DC home    Follow Up Recommendations  Home health PT;Supervision/Assistance - 24 hour     Does the patient have the potential to tolerate intense rehabilitation     Barriers to Discharge        Equipment Recommendations  None recommended by OT;None recommended by PT    Recommendations for Other Services    Frequency 7X/week   Plan Discharge plan remains appropriate;Frequency remains appropriate    Precautions / Restrictions Precautions Precautions: Knee Required Braces or Orthoses: Knee Immobilizer - Left Knee Immobilizer - Left: Discontinue once straight leg raise with < 10 degree lag Restrictions LLE Weight Bearing: Weight bearing as tolerated   Pertinent Vitals/Pain     Mobility  Transfers Sit to Stand: 5: Supervision Stand to Sit: 5: Supervision Ambulation/Gait Ambulation/Gait Assistance: 5: Supervision Ambulation Distance (Feet): 400 Feet Assistive device: Rolling walker Ambulation/Gait Assistance Details: Patient with step through gait pattern this session Gait Pattern: Step-through pattern;Decreased stride length Stairs Assistance: 4: Min assist Stairs Assistance Details (indicate cue type and reason): A with RW. Cues for technique Stair Management Technique: Backwards;With walker;Step to pattern Number of Stairs: 4  (2x2)    Exercises     PT Diagnosis:    PT Problem List:   PT Treatment Interventions:     PT Goals Acute Rehab PT Goals PT Goal: Supine/Side to Sit - Progress: Progressing toward goal PT Goal: Sit to Supine/Side - Progress: Progressing toward goal PT Goal: Sit to Stand - Progress: Progressing toward goal PT Goal: Ambulate -  Progress: Progressing toward goal PT Goal: Up/Down Stairs - Progress: Progressing toward goal  Visit Information  Last PT Received On: 06/28/12 Assistance Needed: +1    Subjective Data      Cognition  Overall Cognitive Status: Appears within functional limits for tasks assessed/performed Arousal/Alertness: Awake/alert Orientation Level: Appears intact for tasks assessed Behavior During Session: [facility] for tasks performed    Balance     End of Session PT - End of Session Equipment Utilized During Treatment: Gait belt;Left knee immobilizer Activity Tolerance: Patient tolerated treatment well Patient left: in chair;with call bell/phone within reach Nurse Communication: Mobility status   GP     Fredrich Birks 06/28/2012, 3:00 PM 06/28/2012 Fredrich Birks PTA [phone] pager [phone] office

## 2012-06-28 NOTE — Progress Notes (Signed)
Occupational Therapy Treatment Patient Details Name: Brandon Soto MRN: [id number] DOB: 02-25-1946 Today's Date: 06/28/2012 Time: 1350-1405 OT Time Calculation (min): 15 min  OT Assessment / Plan / Recommendation Comments on Treatment Session Pt is at adequate level for d/c home. pt requires min v/c to keep correct sequence for LB with RW. Pt can verbalize but does not always demonstrate. Wife is aware and present for all education.    Follow Up Recommendations  Home health OT    Barriers to Discharge       Equipment Recommendations  None recommended by OT    Recommendations for Other Services    Frequency Min 2X/week   Plan Discharge plan remains appropriate    Precautions / Restrictions Precautions Precautions: Knee Precaution Booklet Issued: Yes (comment) Required Braces or Orthoses: Knee Immobilizer - Left Knee Immobilizer - Left: On except when in CPM;Discontinue once straight leg raise with < 10 degree lag Restrictions LLE Weight Bearing: Weight bearing as tolerated   Pertinent Vitals/Pain Not reporting pain at this time    ADL  Eating/Feeding: Set up Where Assessed - Eating/Feeding: Chair Toilet Transfer: Radiographer, therapeutic Method: Sit to Barista: Raised toilet seat with arms (or 3-in-1 over toilet) Tub/Shower Transfer: Minimal assistance Tub/Shower Transfer Method: Science writer: Walk in shower Equipment Used: Gait belt;Knee Immobilizer;Rolling walker Transfers/Ambulation Related to ADLs: pt finishing steps with PTA on arrival. Pt provided rest break in chair prior to attempting shower transfer.  ADL Comments: Pt verbalized aloud the sequence with OT return demonstrating to use teach back method. Once verbalization with visual demonstration completed, pt completed shower transfer with Total +2 (A) and chair just in case buckling occurred. Pt with 100% demo without buckle. Pt ambulating back to room  with PTA.    OT Diagnosis: Generalized weakness;Acute pain  OT Problem List: Decreased strength;Decreased activity tolerance;Impaired balance (sitting and/or standing);Decreased safety awareness;Decreased knowledge of use of DME or AE;Decreased knowledge of precautions;Pain OT Treatment Interventions: Self-care/ADL training;DME and/or AE instruction;Therapeutic activities;Patient/family education;Balance training   OT Goals Acute Rehab OT Goals OT Goal Formulation: With patient/family Time For Goal Achievement: 07/12/12 Potential to Achieve Goals: Good ADL Goals Pt Will Transfer to Toilet: with modified independence;Ambulation;with DME ADL Goal: Toilet Transfer - Progress: Progressing toward goals Pt Will Perform Toileting - Clothing Manipulation: with modified independence;Sitting on 3-in-1 or toilet ADL Goal: Toileting - Clothing Manipulation - Progress: Progressing toward goals Pt Will Perform Toileting - Hygiene: with modified independence;Sit to stand from 3-in-1/toilet ADL Goal: Toileting - Hygiene - Progress: Met Pt Will Perform Tub/Shower Transfer: with min assist;Ambulation;with DME ADL Goal: Tub/Shower Transfer - Progress: Met Miscellaneous OT Goals Miscellaneous OT Goal #1: Pt will complete bed mobility MOD I with HOB flat no rails as precursor to adls OT Goal: Miscellaneous Goal #1 - Progress: Progressing toward goals  Visit Information  Last OT Received On: 06/28/12 Assistance Needed: +1    Subjective Data  Subjective: "I just feel embarrassed now"- pt response after falling Patient Stated Goal: to go home with son (A)ing   Prior Functioning  Home Living Lives With: Spouse Available Help at Discharge: Family;Available 24 hours/day Type of Home: House Home Access: Stairs to enter [employer] of Steps: 1 Entrance Stairs-Rails: None Home Layout: One level Bathroom Shower/Tub: Health visitor: Handicapped height Bathroom Accessibility:  Yes How Accessible: Accessible via walker Home Adaptive Equipment: Walker - rolling;Straight cane;Crutches Prior Function Level of Independence: Independent Able to Take Stairs?: Yes Driving: Yes Vocation:  Retired Musician: No difficulties Dominant Hand: Right    Cognition  Overall Cognitive Status: Appears within functional limits for tasks assessed/performed Arousal/Alertness: Awake/alert Orientation Level: Appears intact for tasks assessed Behavior During Session: [facility] for tasks performed    Mobility  Shoulder Instructions Bed Mobility Bed Mobility: Not assessed Transfers Sit to Stand: 5: Supervision;With upper extremity assist;From chair/3-in-1 Stand to Sit: 5: Supervision;With upper extremity assist;To chair/3-in-1 Details for Transfer Assistance: pt completed sequence with excellent hand placement       Exercises      Balance     End of Session OT - End of Session Activity Tolerance: Patient tolerated treatment well Patient left: Other (comment) (with PTA Raynelle Fanning) Nurse Communication: Mobility status;Precautions  GO     Harrel Carina [facility] 06/28/2012, 2:13 PM Pager: [phone]

## 2012-06-28 NOTE — Progress Notes (Signed)
Patient is going home with home health. Patient has no social work needs at this time. CSW will sign off.  Sabino Niemann, MSW [phone]

## 2012-06-28 NOTE — Progress Notes (Signed)
Orthopedic Tech Progress Note Patient Details:  Brandon Soto 08/29/1945 [id number]  Ortho Devices Type of Ortho Device: Knee Immobilizer Ortho Device/Splint Location: Initial Knee immobilizer was damaged.called for replacement. replacement applied to left LE Ortho Device/Splint Interventions: Application;Other (comment)   Haneen Bernales T 06/28/2012, 12:19 PM

## 2012-12-29 ENCOUNTER — Ambulatory Visit (INDEPENDENT_AMBULATORY_CARE_PROVIDER_SITE_OTHER): Payer: Medicare Other | Admitting: Cardiovascular Disease

## 2012-12-29 ENCOUNTER — Encounter: Payer: Self-pay | Admitting: Cardiovascular Disease

## 2012-12-29 VITALS — BP 130/82 | HR 50 | Ht 73.0 in | Wt 233.6 lb

## 2012-12-29 DIAGNOSIS — E7800 Pure hypercholesterolemia, unspecified: Secondary | ICD-10-CM

## 2012-12-29 DIAGNOSIS — I10 Essential (primary) hypertension: Secondary | ICD-10-CM

## 2012-12-29 DIAGNOSIS — I219 Acute myocardial infarction, unspecified: Secondary | ICD-10-CM

## 2012-12-29 MED ORDER — TADALAFIL 10 MG PO TABS: 10.0000 mg | ORAL_TABLET | Freq: Every day | ORAL | Status: DC | PRN

## 2012-12-29 MED ORDER — LOSARTAN POTASSIUM 50 MG PO TABS: 50.0000 mg | ORAL_TABLET | Freq: Every morning | ORAL | Status: DC

## 2012-12-29 MED ORDER — ATENOLOL 25 MG PO TABS: 12.5000 mg | ORAL_TABLET | Freq: Every day | ORAL | Status: DC

## 2012-12-29 MED ORDER — ROSUVASTATIN CALCIUM 20 MG PO TABS: 20.0000 mg | ORAL_TABLET | Freq: Every day | ORAL | Status: DC

## 2012-12-29 NOTE — Progress Notes (Signed)
12/29/2012 Brandon Soto   1946/06/24  [id number]  Primary Physician Brandon Balls, MD Primary Cardiologist: Brandon Gess MD Brandon Soto  HPI:  The patient is a very pleasant 67 year old, mildly overweight, married, Caucasian male father of 2, grandfather to 3 grandchildren who I last saw in the office 6 months ago. He has a history of CAD status post LAD intervention by Brandon Soto in 2001 in the setting of a myocardial infarction. I catheterization him in 2004 revealing a patent stent with an anteroapical wall motion abnormality and EF of 45-50%. His other problems include hypertension, hyperlipidemia, and hypothyroidism. He denies chest pain or shortness of breath. His last Myoview performed in October of 2011 showed apical scar, and echo showed a normal EF. His most recent lab work revealed a total cholesterol of 146, LDL of 72, and HDL 49.he had a left total knee replacement performed by Brandon Soto 06/27/12 which was uncomplicated. He denies chest pain or shortness of breath. His primary care physician, Brandon Soto, follows his lipid profile closely.     Current Outpatient Prescriptions  Medication Sig Dispense Refill  . acetaminophen (TYLENOL) 325 MG tablet Take 650 mg by mouth every 6 (six) hours as needed. For pain      . aspirin EC 81 MG tablet Take 81 mg by mouth every morning.      [name] atenolol (TENORMIN) 25 MG tablet Take 12.5 mg by mouth daily.      [name] levothyroxine (SYNTHROID, LEVOTHROID) 25 MCG tablet Take 25 mcg by mouth daily.      [name] losartan (COZAAR) 50 MG tablet Take 50 mg by mouth every morning.      [name] LOVAZA 1 G capsule Take 2 g by mouth daily.      . Multiple Vitamin (MULTIVITAMIN WITH MINERALS) TABS Take 1 tablet by mouth daily.      . rosuvastatin (CRESTOR) 20 MG tablet Take 20 mg by mouth at bedtime.      . Tamsulosin HCl (FLOMAX) 0.4 MG CAPS Take 0.4 mg by mouth daily after supper.       No current facility-administered medications for this visit.     No Known Allergies  History   Social History  . Marital Status: Married    Spouse Name: N/A    Number of Children: N/A  . Years of Education: N/A   Occupational History  . Not on file.   Social History Main Topics  . Smoking status: Never Smoker   . Smokeless tobacco: Never Used  . Alcohol Use: No  . Drug Use: No  . Sexually Active: Yes   Other Topics Concern  . Not on file   Social History Narrative  . No narrative on file     Review of Systems: General: negative for chills, fever, night sweats or weight changes.  Cardiovascular: negative for chest pain, dyspnea on exertion, edema, orthopnea, palpitations, paroxysmal nocturnal dyspnea or shortness of breath Dermatological: negative for rash Respiratory: negative for cough or wheezing Urologic: negative for hematuria Abdominal: negative for nausea, vomiting, diarrhea, bright red blood per rectum, melena, or hematemesis Neurologic: negative for visual changes, syncope, or dizziness All other systems reviewed and are otherwise negative except as noted above.    Blood pressure 130/82, pulse 50, height 6\' 1"  (1.854 m), weight 233 lb 9.6 oz (105.96 kg).  General appearance: alert and no distress Neck: no adenopathy, no carotid bruit, no JVD, supple, symmetrical, trachea midline and thyroid not enlarged, symmetric, no tenderness/mass/nodules  Lungs: clear to auscultation bilaterally Heart: regular rate and rhythm, S1, S2 normal, no murmur, click, rub or gallop Extremities: extremities normal, atraumatic, no cyanosis or edema  EKG sinus bradycardia at 50 without ST or T wave changes  ASSESSMENT AND PLAN:   Heart attack History of LAD intervention by Brandon Soto  back in 2001 in the setting of a myocardial infarction. I catheterized him in 2004 revealing a patent stent within antero-apical wall motion abnormality and an EF of 45-50%. He denies chest pain or shortness of breath.  Hypercholesteremia On statin drug  followed by his primary care doctor  Hypertension Well-controlled on current medications      Brandon Gess MD [facility], [facility] 12/29/2012 10:50 AM

## 2012-12-29 NOTE — Assessment & Plan Note (Signed)
On statin drug followed by his primary care doctor 

## 2012-12-29 NOTE — Assessment & Plan Note (Signed)
History of LAD intervention by Dr. Jorje Guild  back in 2001 in the setting of a myocardial infarction. I catheterized him in 2004 revealing a patent stent within antero-apical wall motion abnormality and an EF of 45-50%. He denies chest pain or shortness of breath.

## 2012-12-29 NOTE — Patient Instructions (Addendum)
Your physician wants you to follow-up in:  12 months.  You will receive a reminder letter in the mail two months in advance. If you don't receive a letter, please call our office to schedule the follow-up appointment.   

## 2012-12-29 NOTE — Assessment & Plan Note (Signed)
Well-controlled on current medications 

## 2013-02-08 ENCOUNTER — Other Ambulatory Visit: Payer: Self-pay

## 2013-02-08 MED ORDER — OMEGA-3-ACID ETHYL ESTERS 1 G PO CAPS: 2.0000 g | ORAL_CAPSULE | Freq: Every day | ORAL | Status: DC

## 2013-02-08 NOTE — Telephone Encounter (Signed)
Rx was sent to pharmacy electronically. 

## 2013-05-12 ENCOUNTER — Other Ambulatory Visit: Payer: Self-pay | Admitting: *Deleted

## 2013-05-12 MED ORDER — LEVOTHYROXINE SODIUM 25 MCG PO TABS: 25.0000 ug | ORAL_TABLET | Freq: Every day | ORAL | Status: DC

## 2013-12-01 ENCOUNTER — Telehealth: Payer: Self-pay | Admitting: Cardiovascular Disease

## 2013-12-14 ENCOUNTER — Inpatient Hospital Stay (HOSPITAL_COMMUNITY)
Admission: EM | Admit: 2013-12-14 | Discharge: 2013-12-15 | DRG: 042 | Disposition: A | Discharge status: Home / Self Care | Payer: Medicare Other | Attending: Internal Medicine | Admitting: Internal Medicine

## 2013-12-14 ENCOUNTER — Emergency Department (HOSPITAL_COMMUNITY): Payer: Medicare Other

## 2013-12-14 ENCOUNTER — Encounter (HOSPITAL_COMMUNITY): Payer: Self-pay | Admitting: Emergency Medicine

## 2013-12-14 DIAGNOSIS — E039 Hypothyroidism, unspecified: Secondary | ICD-10-CM | POA: Diagnosis present

## 2013-12-14 DIAGNOSIS — E7800 Pure hypercholesterolemia, unspecified: Secondary | ICD-10-CM | POA: Diagnosis present

## 2013-12-14 DIAGNOSIS — I6350 Cerebral infarction due to unspecified occlusion or stenosis of unspecified cerebral artery: Principal | ICD-10-CM | POA: Diagnosis present

## 2013-12-14 DIAGNOSIS — I639 Cerebral infarction, unspecified: Secondary | ICD-10-CM

## 2013-12-14 DIAGNOSIS — K219 Gastro-esophageal reflux disease without esophagitis: Secondary | ICD-10-CM | POA: Diagnosis present

## 2013-12-14 DIAGNOSIS — Z96659 Presence of unspecified artificial knee joint: Secondary | ICD-10-CM

## 2013-12-14 DIAGNOSIS — N400 Enlarged prostate without lower urinary tract symptoms: Secondary | ICD-10-CM | POA: Diagnosis present

## 2013-12-14 DIAGNOSIS — Z9861 Coronary angioplasty status: Secondary | ICD-10-CM

## 2013-12-14 DIAGNOSIS — Z7982 Long term (current) use of aspirin: Secondary | ICD-10-CM

## 2013-12-14 DIAGNOSIS — E079 Disorder of thyroid, unspecified: Secondary | ICD-10-CM

## 2013-12-14 DIAGNOSIS — I10 Essential (primary) hypertension: Secondary | ICD-10-CM | POA: Diagnosis present

## 2013-12-14 DIAGNOSIS — M1710 Unilateral primary osteoarthritis, unspecified knee: Secondary | ICD-10-CM | POA: Diagnosis present

## 2013-12-14 DIAGNOSIS — M069 Rheumatoid arthritis, unspecified: Secondary | ICD-10-CM | POA: Diagnosis present

## 2013-12-14 DIAGNOSIS — R1310 Dysphagia, unspecified: Secondary | ICD-10-CM | POA: Diagnosis present

## 2013-12-14 DIAGNOSIS — R4701 Aphasia: Secondary | ICD-10-CM | POA: Diagnosis present

## 2013-12-14 DIAGNOSIS — I2510 Atherosclerotic heart disease of native coronary artery without angina pectoris: Secondary | ICD-10-CM | POA: Diagnosis present

## 2013-12-14 DIAGNOSIS — I252 Old myocardial infarction: Secondary | ICD-10-CM

## 2013-12-14 HISTORY — DX: Cerebral infarction, unspecified: I63.9

## 2013-12-14 LAB — COMPREHENSIVE METABOLIC PANEL
ALK PHOS: 50 U/L (ref 39–117)
ALT: 33 U/L (ref 0–53)
AST: 26 U/L (ref 0–37)
Albumin: 3.9 g/dL (ref 3.5–5.2)
BUN: 23 mg/dL (ref 6–23)
CO2: 25 meq/L (ref 19–32)
Calcium: 9.5 mg/dL (ref 8.4–10.5)
Chloride: 101 mEq/L (ref 96–112)
Creatinine, Ser: 0.93 mg/dL (ref 0.50–1.35)
GFR, EST NON AFRICAN AMERICAN: 84 mL/min — AB (ref >90)
GLUCOSE: 98 mg/dL (ref 70–99)
POTASSIUM: 4.4 meq/L (ref 3.7–5.3)
Sodium: 139 mEq/L (ref 137–147)
Total Bilirubin: 0.8 mg/dL (ref 0.3–1.2)
Total Protein: 7.3 g/dL (ref 6.0–8.3)

## 2013-12-14 LAB — DIFFERENTIAL
Basophils Absolute: 0 10*3/uL (ref 0.0–0.1)
Basophils Relative: 0 % (ref 0–1)
Eosinophils Absolute: 0.1 10*3/uL (ref 0.0–0.7)
Eosinophils Relative: 1 % (ref 0–5)
LYMPHS ABS: 1.3 10*3/uL (ref 0.7–4.0)
LYMPHS PCT: 23 % (ref 12–46)
MONOS PCT: 11 % (ref 3–12)
Monocytes Absolute: 0.7 10*3/uL (ref 0.1–1.0)
NEUTROS PCT: 65 % (ref 43–77)
Neutro Abs: 3.9 10*3/uL (ref 1.7–7.7)

## 2013-12-14 LAB — URINALYSIS, ROUTINE W REFLEX MICROSCOPIC
BILIRUBIN URINE: NEGATIVE
Glucose, UA: NEGATIVE mg/dL
Hgb urine dipstick: NEGATIVE
Ketones, ur: 15 mg/dL — AB
Leukocytes, UA: NEGATIVE
Nitrite: NEGATIVE
Protein, ur: NEGATIVE mg/dL
SPECIFIC GRAVITY, URINE: 1.016 (ref 1.005–1.030)
UROBILINOGEN UA: 0.2 mg/dL (ref 0.0–1.0)
pH: 6 (ref 5.0–8.0)

## 2013-12-14 LAB — PROTIME-INR
INR: 1.12 (ref 0.00–1.49)
PROTHROMBIN TIME: 14.2 s (ref 11.6–15.2)

## 2013-12-14 LAB — CBC
HCT: 38.2 % — ABNORMAL LOW (ref 39.0–52.0)
HEMOGLOBIN: 12.7 g/dL — AB (ref 13.0–17.0)
MCH: 32.5 pg (ref 26.0–34.0)
MCHC: 33.2 g/dL (ref 30.0–36.0)
MCV: 97.7 fL (ref 78.0–100.0)
Platelets: 161 10*3/uL (ref 150–400)
RBC: 3.91 MIL/uL — AB (ref 4.22–5.81)
RDW: 12.5 % (ref 11.5–15.5)
WBC: 6 10*3/uL (ref 4.0–10.5)

## 2013-12-14 LAB — RAPID URINE DRUG SCREEN, HOSP PERFORMED
Amphetamines: NOT DETECTED
BARBITURATES: NOT DETECTED
Benzodiazepines: NOT DETECTED
Cocaine: NOT DETECTED
Opiates: NOT DETECTED
TETRAHYDROCANNABINOL: NOT DETECTED

## 2013-12-14 LAB — I-STAT TROPONIN, ED: Troponin i, poc: 0 ng/mL (ref 0.00–0.08)

## 2013-12-14 LAB — ETHANOL: Alcohol, Ethyl (B): <11 mg/dL (ref 0–11)

## 2013-12-14 LAB — APTT: aPTT: 28 seconds (ref 24–37)

## 2013-12-14 MED ORDER — DIPHENHYDRAMINE HCL 25 MG PO CAPS: 25.0000 mg | ORAL_CAPSULE | Freq: Once | ORAL | Status: DC

## 2013-12-14 MED ORDER — ACETAMINOPHEN 325 MG PO TABS: 650.0000 mg | ORAL_TABLET | Freq: Once | ORAL | Status: DC

## 2013-12-14 NOTE — ED Notes (Signed)
Discussed with the patient that he will need to submit urine sample.  Explained that he is NPO until testing to rule out stroke has been completed, and that he will soon travel to [place]. Patient acknowledges, and ambulates to restroom.

## 2013-12-14 NOTE — ED Notes (Signed)
Patient still off the unit for testing at MRI.

## 2013-12-14 NOTE — ED Notes (Signed)
Attempted to call report to floor 

## 2013-12-14 NOTE — H&P (Signed)
[facility] History and Physical  Patient: Brandon Soto  TDV:[id number]  DOB: 30-Jul-1945  DOS: the patient was seen and examined on 12/14/2013 PCP: [name], MD  Chief Complaint: Difficulty speaking  HPI: WEBB WEED is a 68 y.o. male with Past medical history of hypertension, coronary artery disease, hypothyroidism, GERD, arthritis. The patient presented with complaints of difficulty with speaking. He mentions since last to 3 days he has multiple episodes of difficulty with speaking. And tonight he was not able to speak at all. He mentions he was able to gather his thoughts but he was not able to bring out his warts. He was able to understand. He is able to follow command. No headache no blurring of the vision no difficulty swallowing no difficulty hearing no difficulty with balance and no fall no trauma no injury or chest pain no shortness of breath. Since last 2 days he has watery diarrhea without any blood. No recent change no medication no antibiotics. He has been placed on increasing dose of Synthroid due to poor response last dose change was one month ago.  The patient is coming from home. And at his baseline independent for most of his ADL.  Review of Systems: as mentioned in the history of present illness.  A Comprehensive review of the other systems is negative.  Past Medical History  Diagnosis Date  . Hypertension   . Hypercholesteremia   . H/O dizziness   . BPH (benign prostatic hyperplasia)   . PONV (postoperative nausea and vomiting)   . Coronary artery disease   . Heart attack 2001  . Hypothyroidism     "shrank w/radiation ?1990's" (06/27/2012)  . GERD (gastroesophageal reflux disease)   . Left knee DJD   . Rheumatoid arthritis(714.0)     "all over; hands especially" (06/27/2012)  . Dysphagia    Past Surgical History  Procedure Laterality Date  . Coronary angioplasty with stent placement  2001    "1" (06/27/2012)  . Cardiac catheterization  ~ 2009  .  Shoulder arthroscopy with rotator cuff repair and subacromial decompression  2005    "left" (06/27/2012)  . Coronary angioplasty    . Replacement unicondylar joint knee  06/27/2012    "left" (06/27/2012)  . Cholecystectomy  ~ 2006  . Joint replacement    . Partial knee arthroplasty  06/27/2012    Procedure: UNICOMPARTMENTAL KNEE;  Surgeon: Nilda Simmer, MD;  Location: [facility] OR;  Service: Orthopedics;  Laterality: Left;  left unicompartmental knee   Social History:  reports that he has never smoked. He has never used smokeless tobacco. He reports that he does not drink alcohol or use illicit drugs.  Allergies  Allergen Reactions  . Penicillins     Pt unsure of reaction    Family History  Problem Relation Age of Onset  . Heart attack Mother   . Heart disease Brother   . Cancer Brother   . Cancer - Lung Brother     Prior to Admission medications   Medication Sig Start Date End Date Taking? Authorizing Provider  acetaminophen (TYLENOL) 325 MG tablet Take 650 mg by mouth every 6 (six) hours as needed for mild pain. For pain in hands   Yes Historical Provider, MD  amoxicillin (AMOXIL) 500 MG capsule Take 500 mg by mouth See admin instructions. Take 2 capsules 1 hour prior to dental appt and 2 capsules 1 hour post dental work   Yes Historical Provider, MD  aspirin EC 81 MG tablet Take 81  mg by mouth every morning.   Yes Historical Provider, MD  atenolol (TENORMIN) 25 MG tablet Take 0.5 tablets (12.5 mg total) by mouth daily. 12/29/12  Yes Runell Gess, MD  levothyroxine (SYNTHROID, LEVOTHROID) 300 MCG tablet Take 300 mcg by mouth daily before breakfast.   Yes Historical Provider, MD  losartan (COZAAR) 50 MG tablet Take 50 mg by mouth every morning. 12/29/12  Yes Runell Gess, MD  meloxicam (MOBIC) 15 MG tablet Take 15 mg by mouth daily.   Yes Historical Provider, MD  Multiple Vitamin (MULTIVITAMIN WITH MINERALS) TABS Take 1 tablet by mouth daily.   Yes Historical Provider, MD  omega-3  acid ethyl esters (LOVAZA) 1 G capsule Take 2 g by mouth daily. Taking the [employer] brand 02/08/13  Yes Runell Gess, MD  omeprazole (PRILOSEC) 40 MG capsule Take 40 mg by mouth 2 (two) times daily.   Yes Historical Provider, MD  rosuvastatin (CRESTOR) 20 MG tablet Take 1 tablet (20 mg total) by mouth at bedtime. 12/29/12  Yes Runell Gess, MD  tadalafil (CIALIS) 10 MG tablet Take 1 tablet (10 mg total) by mouth daily as needed for erectile dysfunction. 12/29/12  Yes Runell Gess, MD    Physical Exam: Filed Vitals:   12/14/13 2015 12/14/13 2230 12/14/13 2233 12/14/13 2245  BP: 123/66 141/76 141/76 129/63  Pulse: 55 51 55 52  Temp:      TempSrc:      Resp: 23  19 21   Height:      Weight:      SpO2: 96% 93% 96% 92%    General: Alert, Awake and Oriented to Time, Place and Person. Appear in mild distress Eyes: PERRL ENT: Oral Mucosa clear moist. Neck: No JVD Cardiovascular: S1 and S2 Present, no Murmur, Peripheral Pulses Present Respiratory: Bilateral Air entry equal and Decreased, Clear to Auscultation,  No Crackles, no wheezes Abdomen: Bowel Sound Present, Soft and Non tender Skin: No Rash Extremities: No Pedal edema, no calf tenderness Neurologic: Grossly no focal neuro deficit.  Labs on Admission:  CBC:  Recent Labs Lab 12/14/13 1900  WBC 6.0  NEUTROABS 3.9  HGB 12.7*  HCT 38.2*  MCV 97.7  PLT 161    CMP     Component Value Date/Time   NA 139 12/14/2013 1900   K 4.4 12/14/2013 1900   CL 101 12/14/2013 1900   CO2 25 12/14/2013 1900   GLUCOSE 98 12/14/2013 1900   BUN 23 12/14/2013 1900   CREATININE 0.93 12/14/2013 1900   CALCIUM 9.5 12/14/2013 1900   PROT 7.3 12/14/2013 1900   ALBUMIN 3.9 12/14/2013 1900   AST 26 12/14/2013 1900   ALT 33 12/14/2013 1900   ALKPHOS 50 12/14/2013 1900   BILITOT 0.8 12/14/2013 1900   GFRNONAA 84* 12/14/2013 1900   GFRAA >90 12/14/2013 1900    No results found for this basename: LIPASE, AMYLASE,  in the last 168 hours No results found  for this basename: AMMONIA,  in the last 168 hours  No results found for this basename: CKTOTAL, CKMB, CKMBINDEX, TROPONINI,  in the last 168 hours BNP (last 3 results) No results found for this basename: PROBNP,  in the last 8760 hours  Radiological Exams on Admission: Ct Head Wo Contrast  12/14/2013   CLINICAL DATA:  Dysphasia.  EXAM: CT HEAD WITHOUT CONTRAST  TECHNIQUE: Contiguous axial images were obtained from the base of the skull through the vertex without intravenous contrast.  COMPARISON:  None.  FINDINGS:  Bony calvarium appears intact. No mass effect or midline shift is noted. Ventricular size is within normal limits. There is no evidence of mass lesion, hemorrhage or acute infarction. Rounded low density is noted in right basal ganglia consistent with old lacunar infarction.  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 No acute intracranial abnormality seen.   Electronically Signed   By: Roque Lias M.D.   On: 12/14/2013 19:44   Mr Angiogram Head Wo Contrast  12/14/2013   CLINICAL DATA:  Dysphagia for 2 days becoming worse. Hypertension and hypercholesterolemia.  EXAM: MRI HEAD WITHOUT CONTRAST  MRA HEAD WITHOUT CONTRAST  TECHNIQUE: Multiplanar, multiecho pulse sequences of the brain and surrounding structures were obtained without intravenous contrast. Angiographic images of the head were obtained using MRA technique without contrast.  COMPARISON:  12/14/2013 CT.  No comparison MR.  FINDINGS: MRI HEAD FINDINGS  Moderate size nonhemorrhagic right mid frontal lobe infarct extending to the operculum region.  Small acute infarct right cerebellum.  Involvement of two vascular distributions raises possibility of embolic disease.  Mild to moderate nonspecific white matter type changes which in the present clinical setting is most consistent with result of small vessel disease.  No intracranial mass lesion noted on this unenhanced exam.  No hydrocephalus.  Prominent peri vascular space  right basal ganglia.  Minimal to mild paranasal sinus mucosal thickening.  MRA HEAD FINDINGS  Anterior circulation without medium or large size vessel significant stenosis or occlusion.  Mild to moderate irregularity middle cerebral artery branches greater on the right. Decreased number of visualized right middle cerebral artery branch vessels consistent with patient's right frontal lobe infarct.  Left vertebral artery is dominant. No significant stenosis of the distal vertebral arteries or basilar artery. Basilar artery is mildly ectatic.  Poor delineation of the anterior inferior cerebellar arteries.  Slight irregularity left posterior cerebral artery distal branches.  No aneurysm noted.  IMPRESSION: MRI HEAD:  Moderate size nonhemorrhagic right mid frontal lobe infarct extending to the operculum region.  Small acute infarct right cerebellum.  Involvement of two vascular distributions raises possibility of embolic disease.  Mild to moderate nonspecific white matter type changes which in the present clinical setting is most consistent with result of small vessel disease.  MRA HEAD:  Anterior circulation without medium or large size vessel significant stenosis or occlusion.  Mild to moderate irregularity middle cerebral artery branches greater on the right. Decreased number of visualized right middle cerebral artery branch vessels consistent with patient's right frontal lobe infarct.  Left vertebral artery is dominant. No significant stenosis of the distal vertebral arteries or basilar artery. Basilar artery is mildly ectatic.  Poor delineation of the anterior inferior cerebellar arteries.  Slight irregularity left posterior cerebral artery distal branches.   Electronically Signed   By: Bridgett Larsson M.D.   On: 12/14/2013 21:36   Mr Brain Wo Contrast  12/14/2013   CLINICAL DATA:  Dysphagia for 2 days becoming worse. Hypertension and hypercholesterolemia.  EXAM: MRI HEAD WITHOUT CONTRAST  MRA HEAD WITHOUT CONTRAST   TECHNIQUE: Multiplanar, multiecho pulse sequences of the brain and surrounding structures were obtained without intravenous contrast. Angiographic images of the head were obtained using MRA technique without contrast.  COMPARISON:  12/14/2013 CT.  No comparison MR.  FINDINGS: MRI HEAD FINDINGS  Moderate size nonhemorrhagic right mid frontal lobe infarct extending to the operculum region.  Small acute infarct right cerebellum.  Involvement of two vascular distributions raises possibility of embolic disease.  Mild to moderate nonspecific  white matter type changes which in the present clinical setting is most consistent with result of small vessel disease.  No intracranial mass lesion noted on this unenhanced exam.  No hydrocephalus.  Prominent peri vascular space right basal ganglia.  Minimal to mild paranasal sinus mucosal thickening.  MRA HEAD FINDINGS  Anterior circulation without medium or large size vessel significant stenosis or occlusion.  Mild to moderate irregularity middle cerebral artery branches greater on the right. Decreased number of visualized right middle cerebral artery branch vessels consistent with patient's right frontal lobe infarct.  Left vertebral artery is dominant. No significant stenosis of the distal vertebral arteries or basilar artery. Basilar artery is mildly ectatic.  Poor delineation of the anterior inferior cerebellar arteries.  Slight irregularity left posterior cerebral artery distal branches.  No aneurysm noted.  IMPRESSION: MRI HEAD:  Moderate size nonhemorrhagic right mid frontal lobe infarct extending to the operculum region.  Small acute infarct right cerebellum.  Involvement of two vascular distributions raises possibility of embolic disease.  Mild to moderate nonspecific white matter type changes which in the present clinical setting is most consistent with result of small vessel disease.  MRA HEAD:  Anterior circulation without medium or large size vessel significant  stenosis or occlusion.  Mild to moderate irregularity middle cerebral artery branches greater on the right. Decreased number of visualized right middle cerebral artery branch vessels consistent with patient's right frontal lobe infarct.  Left vertebral artery is dominant. No significant stenosis of the distal vertebral arteries or basilar artery. Basilar artery is mildly ectatic.  Poor delineation of the anterior inferior cerebellar arteries.  Slight irregularity left posterior cerebral artery distal branches.   Electronically Signed   By: Bridgett Larsson M.D.   On: 12/14/2013 21:36   EKG: Independently reviewed. normal EKG, normal sinus rhythm, nonspecific ST and T waves changes.  Assessment/Plan Principal Problem:   Acute CVA (cerebrovascular accident) Active Problems:   Thyroid disease   Hypertension   Hypercholesteremia   1. Acute CVA (cerebrovascular accident) The patient is presenting with complaints of expressive aphasia. CT scan was negative but MRI is positive for acute stroke and multiple yes. At that patient will be admitted for further workup and physical therapy occupational therapy consultation. I will obtain carotid Doppler echocardiogram. Next I would change his aspirin to Plavix. Lipitor 80 mg. Neurologic consultation appreciated. Monitor on telemetry.  2. Thyroid disease Check TSH and free T4 Continue Synthroid  3. Hypertension Hold atenolol continue losartan  Consults: Neurology  DVT Prophylaxis: subcutaneous Heparin Nutrition: N.p.o. except ice chips until speech evaluation  Code Status: Full  Family Communication: Wife was present at bedside, opportunity was given to ask question and all questions were answered satisfactorily at the time of interview. Disposition: Admitted to inpatient in telemetry unit.  Author: Lynden Oxford, MD [facility] Pager: [phone] 12/14/2013, 11:12 PM    If 7PM-7AM, please contact night-coverage [email or web address] Password  [email or web address]

## 2013-12-14 NOTE — Telephone Encounter (Signed)
Closed encounter °

## 2013-12-14 NOTE — ED Notes (Signed)
Patient still off the unit for MRI testing.

## 2013-12-14 NOTE — ED Notes (Addendum)
Pt arrived from home by [facility]. Pt has been having some dysphasia x 2 days that has been getting worse. Stated that he has trouble finding the words to say. Wife at bedside stated that pt had radiation to thyroid done in 1986 and then this past February thyroid levels have been elevating and they keep increased Thyroid medication and currently was told by PCP that he is on the highest dose of Levothyroxine that he has ever prescribed. Pt has also had some diarrhea that has increased as well. Denies any pain. EMS stroke scale negative other than the dysphasia. Pt also started on omeprazole this past month.

## 2013-12-14 NOTE — ED Provider Notes (Signed)
Date: 12/14/2013  Rate: 56  Rhythm: normal sinus rhythm  QRS Axis: normal  Intervals: normal  ST/T Wave abnormalities: normal  Conduction Disutrbances:none  Narrative Interpretation:   Old EKG Reviewed: none available    Glynn Octave, MD 12/14/13 [phone]

## 2013-12-14 NOTE — ED Provider Notes (Signed)
CSN: [id number]     Arrival date & time 12/14/13  1821 History   First MD Initiated Contact with Patient 12/14/13 1822     Chief Complaint  Patient presents with  . Stroke Symptoms     (Consider location/radiation/quality/duration/timing/severity/associated sxs/prior Treatment) HPI Comments: Patient with history of hypothyroidism, hypertension, hypercholesterolemia, history of MI, no previous history of CVA -- presents with two-day history of expressive aphasia. Symptoms were mild at onset. Patient has been searching for and having trouble getting out words. Family denies any inappropriate words or word substitutions. Patient has been following all commands and does not appear confused. Patient denies other signs of stroke including: facial droop, slurred speech, weakness/numbness in extremities, imbalance/trouble walking. He has been having episodes of diarrhea but no fever, neck pain or headache. Family notes that symptoms were much worse today which prompted emergency department evaluation. No recent medication changes except for increase in levothyroxine. Patient denies head trauma. The onset of this condition was acute. The course is gradually worsening. Aggravating factors: none. Alleviating factors: none.    The history is provided by the patient and a relative.    Past Medical History  Diagnosis Date  . Hypertension   . Hypercholesteremia   . H/O dizziness   . BPH (benign prostatic hyperplasia)   . PONV (postoperative nausea and vomiting)   . Coronary artery disease   . Heart attack 2001  . Hypothyroidism     "shrank w/radiation ?1990's" (06/27/2012)  . GERD (gastroesophageal reflux disease)   . Left knee DJD   . Rheumatoid arthritis(714.0)     "all over; hands especially" (06/27/2012)   Past Surgical History  Procedure Laterality Date  . Coronary angioplasty with stent placement  2001    "1" (06/27/2012)  . Cardiac catheterization  ~ 2009  . Shoulder arthroscopy with  rotator cuff repair and subacromial decompression  2005    "left" (06/27/2012)  . Coronary angioplasty    . Replacement unicondylar joint knee  06/27/2012    "left" (06/27/2012)  . Cholecystectomy  ~ 2006  . Joint replacement    . Partial knee arthroplasty  06/27/2012    Procedure: UNICOMPARTMENTAL KNEE;  Surgeon: Nilda Simmer, MD;  Location: [facility] OR;  Service: Orthopedics;  Laterality: Left;  left unicompartmental knee   Family History  Problem Relation Age of Onset  . Heart attack Mother   . Heart disease Brother   . Cancer Brother   . Cancer - Lung Brother    History  Substance Use Topics  . Smoking status: Never Smoker   . Smokeless tobacco: Never Used  . Alcohol Use: No    Review of Systems  Constitutional: Negative for fever.  HENT: Negative for rhinorrhea and sore throat.   Eyes: Negative for redness.  Respiratory: Negative for cough.   Cardiovascular: Negative for chest pain.  Gastrointestinal: Positive for diarrhea. Negative for nausea, vomiting and abdominal pain.  Genitourinary: Negative for dysuria.  Musculoskeletal: Negative for myalgias.  Skin: Negative for rash.  Neurological: Positive for speech difficulty. Negative for dizziness, syncope, facial asymmetry, weakness, light-headedness, numbness and headaches.   Allergies  Penicillins  Home Medications   Prior to Admission medications   Medication Sig Start Date End Date Taking? Authorizing Provider  acetaminophen (TYLENOL) 325 MG tablet Take 650 mg by mouth every 6 (six) hours as needed. For pain    Historical Provider, MD  aspirin EC 81 MG tablet Take 81 mg by mouth every morning.    Historical Provider, MD  atenolol (TENORMIN) 25 MG tablet Take 0.5 tablets (12.5 mg total) by mouth daily. 12/29/12   Runell Gess, MD  levothyroxine (SYNTHROID, LEVOTHROID) 25 MCG tablet Take 1 tablet (25 mcg total) by mouth daily. 05/12/13   Runell Gess, MD  losartan (COZAAR) 50 MG tablet Take 1 tablet (50 mg total) by  mouth every morning. 12/29/12   Runell Gess, MD  Multiple Vitamin (MULTIVITAMIN WITH MINERALS) TABS Take 1 tablet by mouth daily.    Historical Provider, MD  omega-3 acid ethyl esters (LOVAZA) 1 G capsule Take 2 capsules (2 g total) by mouth daily. 02/08/13   Runell Gess, MD  rosuvastatin (CRESTOR) 20 MG tablet Take 1 tablet (20 mg total) by mouth at bedtime. 12/29/12   Runell Gess, MD  tadalafil (CIALIS) 10 MG tablet Take 1 tablet (10 mg total) by mouth daily as needed for erectile dysfunction. 12/29/12   Runell Gess, MD  Tamsulosin HCl (FLOMAX) 0.4 MG CAPS Take 0.4 mg by mouth daily after supper.    Historical Provider, MD   BP 133/75  Pulse 59  Temp(Src) 98.4 F (36.9 C) (Oral)  Resp 20  Ht 6\' 1"  (1.854 m)  Wt 235 lb (106.595 kg)  BMI 31.01 kg/m2  SpO2 97%  Physical Exam  Nursing note and vitals reviewed. Constitutional: He is oriented to person, place, and time. He appears well-developed and well-nourished.  HENT:  Head: Normocephalic and atraumatic.  Right Ear: Tympanic membrane, external ear and ear canal normal.  Left Ear: Tympanic membrane, external ear and ear canal normal.  Nose: Nose normal.  Mouth/Throat: Uvula is midline, oropharynx is clear and moist and mucous membranes are normal.  Eyes: Conjunctivae, EOM and lids are normal. Pupils are equal, round, and reactive to light.  Neck: Normal range of motion. Neck supple.  No carotid bruits heard.  Cardiovascular: Normal rate and regular rhythm.   Pulmonary/Chest: Effort normal and breath sounds normal.  Abdominal: Soft. There is no tenderness.  Musculoskeletal: Normal range of motion.       Cervical back: He exhibits normal range of motion, no tenderness and no bony tenderness.  Neurological: He is alert and oriented to person, place, and time. He has normal strength and normal reflexes. No cranial nerve deficit or sensory deficit. He exhibits normal muscle tone. Coordination normal. GCS eye subscore is 4.  GCS verbal subscore is 5. GCS motor subscore is 6.  Patient with slowed speech, word finding difficulty. Can give answers of one to 2 words without difficulty.  Skin: Skin is warm and dry.  Psychiatric: He has a normal mood and affect.    ED Course  Procedures (including critical care time) Labs Review Labs Reviewed  CBC - Abnormal; Notable for the following:    RBC 3.91 (*)    Hemoglobin 12.7 (*)    HCT 38.2 (*)    All other components within normal limits  COMPREHENSIVE METABOLIC PANEL - Abnormal; Notable for the following:    GFR calc non Af Amer 84 (*)    All other components within normal limits  URINALYSIS, ROUTINE W REFLEX MICROSCOPIC - Abnormal; Notable for the following:    Ketones, ur 15 (*)    All other components within normal limits  PROTIME-INR  APTT  DIFFERENTIAL  URINE RAPID DRUG SCREEN (HOSP PERFORMED)  ETHANOL  I-STAT TROPOININ, ED    Imaging Review Ct Head Wo Contrast  12/14/2013   CLINICAL DATA:  Dysphasia.  EXAM: CT HEAD WITHOUT CONTRAST  TECHNIQUE: Contiguous axial images were obtained from the base of the skull through the vertex without intravenous contrast.  COMPARISON:  None.  FINDINGS: Bony calvarium appears intact. No mass effect or midline shift is noted. Ventricular size is within normal limits. There is no evidence of mass lesion, hemorrhage or acute infarction. Rounded low density is noted in right basal ganglia consistent with old lacunar infarction.  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 No acute intracranial abnormality seen.   Electronically Signed   By: Roque Lias M.D.   On: 12/14/2013 19:44   Mr Angiogram Head Wo Contrast  12/14/2013   CLINICAL DATA:  Dysphagia for 2 days becoming worse. Hypertension and hypercholesterolemia.  EXAM: MRI HEAD WITHOUT CONTRAST  MRA HEAD WITHOUT CONTRAST  TECHNIQUE: Multiplanar, multiecho pulse sequences of the brain and surrounding structures were obtained without intravenous contrast.  Angiographic images of the head were obtained using MRA technique without contrast.  COMPARISON:  12/14/2013 CT.  No comparison MR.  FINDINGS: MRI HEAD FINDINGS  Moderate size nonhemorrhagic right mid frontal lobe infarct extending to the operculum region.  Small acute infarct right cerebellum.  Involvement of two vascular distributions raises possibility of embolic disease.  Mild to moderate nonspecific white matter type changes which in the present clinical setting is most consistent with result of small vessel disease.  No intracranial mass lesion noted on this unenhanced exam.  No hydrocephalus.  Prominent peri vascular space right basal ganglia.  Minimal to mild paranasal sinus mucosal thickening.  MRA HEAD FINDINGS  Anterior circulation without medium or large size vessel significant stenosis or occlusion.  Mild to moderate irregularity middle cerebral artery branches greater on the right. Decreased number of visualized right middle cerebral artery branch vessels consistent with patient's right frontal lobe infarct.  Left vertebral artery is dominant. No significant stenosis of the distal vertebral arteries or basilar artery. Basilar artery is mildly ectatic.  Poor delineation of the anterior inferior cerebellar arteries.  Slight irregularity left posterior cerebral artery distal branches.  No aneurysm noted.  IMPRESSION: MRI HEAD:  Moderate size nonhemorrhagic right mid frontal lobe infarct extending to the operculum region.  Small acute infarct right cerebellum.  Involvement of two vascular distributions raises possibility of embolic disease.  Mild to moderate nonspecific white matter type changes which in the present clinical setting is most consistent with result of small vessel disease.  MRA HEAD:  Anterior circulation without medium or large size vessel significant stenosis or occlusion.  Mild to moderate irregularity middle cerebral artery branches greater on the right. Decreased number of visualized  right middle cerebral artery branch vessels consistent with patient's right frontal lobe infarct.  Left vertebral artery is dominant. No significant stenosis of the distal vertebral arteries or basilar artery. Basilar artery is mildly ectatic.  Poor delineation of the anterior inferior cerebellar arteries.  Slight irregularity left posterior cerebral artery distal branches.   Electronically Signed   By: Bridgett Larsson M.D.   On: 12/14/2013 21:36   Mr Brain Wo Contrast  12/14/2013   CLINICAL DATA:  Dysphagia for 2 days becoming worse. Hypertension and hypercholesterolemia.  EXAM: MRI HEAD WITHOUT CONTRAST  MRA HEAD WITHOUT CONTRAST  TECHNIQUE: Multiplanar, multiecho pulse sequences of the brain and surrounding structures were obtained without intravenous contrast. Angiographic images of the head were obtained using MRA technique without contrast.  COMPARISON:  12/14/2013 CT.  No comparison MR.  FINDINGS: MRI HEAD FINDINGS  Moderate size nonhemorrhagic right mid frontal lobe infarct extending to the  operculum region.  Small acute infarct right cerebellum.  Involvement of two vascular distributions raises possibility of embolic disease.  Mild to moderate nonspecific white matter type changes which in the present clinical setting is most consistent with result of small vessel disease.  No intracranial mass lesion noted on this unenhanced exam.  No hydrocephalus.  Prominent peri vascular space right basal ganglia.  Minimal to mild paranasal sinus mucosal thickening.  MRA HEAD FINDINGS  Anterior circulation without medium or large size vessel significant stenosis or occlusion.  Mild to moderate irregularity middle cerebral artery branches greater on the right. Decreased number of visualized right middle cerebral artery branch vessels consistent with patient's right frontal lobe infarct.  Left vertebral artery is dominant. No significant stenosis of the distal vertebral arteries or basilar artery. Basilar artery is mildly  ectatic.  Poor delineation of the anterior inferior cerebellar arteries.  Slight irregularity left posterior cerebral artery distal branches.  No aneurysm noted.  IMPRESSION: MRI HEAD:  Moderate size nonhemorrhagic right mid frontal lobe infarct extending to the operculum region.  Small acute infarct right cerebellum.  Involvement of two vascular distributions raises possibility of embolic disease.  Mild to moderate nonspecific white matter type changes which in the present clinical setting is most consistent with result of small vessel disease.  MRA HEAD:  Anterior circulation without medium or large size vessel significant stenosis or occlusion.  Mild to moderate irregularity middle cerebral artery branches greater on the right. Decreased number of visualized right middle cerebral artery branch vessels consistent with patient's right frontal lobe infarct.  Left vertebral artery is dominant. No significant stenosis of the distal vertebral arteries or basilar artery. Basilar artery is mildly ectatic.  Poor delineation of the anterior inferior cerebellar arteries.  Slight irregularity left posterior cerebral artery distal branches.   Electronically Signed   By: Bridgett Larsson M.D.   On: 12/14/2013 21:36     EKG Interpretation None      6:56 PM Patient seen and examined. Work-up initiated. Medications ordered. D/w Dr. Manus Gunning.   Vital signs reviewed and are as follows: Filed Vitals:   12/14/13 1830  BP: 133/75  Pulse: 59  Temp: 98.4 F (36.9 C)  Resp: 20   8:32 PM Pt now in MRI. Family reports that patient is speaking better than he was.   MRI + for CVA. Pt and family informed. Will admit.   Spoke with Dr. Thad Ranger who will consult.   Spoke with Dr. Allena Katz who will admit.    MDM   Final diagnoses:  Acute ischemic stroke   Admit for CVA with expressive aphasia.     Renne Crigler, PA-C 12/14/13 2330

## 2013-12-15 ENCOUNTER — Encounter (HOSPITAL_COMMUNITY)
Admission: EM | Disposition: A | Discharge status: Home / Self Care | Payer: Self-pay | Source: Home / Self Care | Attending: Internal Medicine

## 2013-12-15 ENCOUNTER — Encounter (HOSPITAL_COMMUNITY): Payer: Self-pay | Admitting: *Deleted

## 2013-12-15 DIAGNOSIS — E7800 Pure hypercholesterolemia, unspecified: Secondary | ICD-10-CM

## 2013-12-15 DIAGNOSIS — E079 Disorder of thyroid, unspecified: Secondary | ICD-10-CM

## 2013-12-15 DIAGNOSIS — I6350 Cerebral infarction due to unspecified occlusion or stenosis of unspecified cerebral artery: Principal | ICD-10-CM

## 2013-12-15 DIAGNOSIS — I639 Cerebral infarction, unspecified: Secondary | ICD-10-CM | POA: Diagnosis present

## 2013-12-15 DIAGNOSIS — I379 Nonrheumatic pulmonary valve disorder, unspecified: Secondary | ICD-10-CM

## 2013-12-15 DIAGNOSIS — I10 Essential (primary) hypertension: Secondary | ICD-10-CM

## 2013-12-15 HISTORY — PX: LOOP RECORDER IMPLANT: SHX5477

## 2013-12-15 HISTORY — PX: LOOP RECORDER IMPLANT: SHX5954

## 2013-12-15 LAB — GI PATHOGEN PANEL BY PCR, STOOL
C difficile toxin A/B: NEGATIVE
CAMPYLOBACTER BY PCR: NEGATIVE
Cryptosporidium by PCR: NEGATIVE
E coli (ETEC) LT/ST: NEGATIVE
E coli (STEC): NEGATIVE
E coli 0157 by PCR: NEGATIVE
G lamblia by PCR: NEGATIVE
NOROVIRUS G1/G2: NEGATIVE
ROTAVIRUS A BY PCR: NEGATIVE
Salmonella by PCR: NEGATIVE
Shigella by PCR: NEGATIVE

## 2013-12-15 LAB — COMPREHENSIVE METABOLIC PANEL
ALT: 36 U/L (ref 0–53)
AST: 30 U/L (ref 0–37)
Albumin: 3.8 g/dL (ref 3.5–5.2)
Alkaline Phosphatase: 52 U/L (ref 39–117)
BILIRUBIN TOTAL: 1.1 mg/dL (ref 0.3–1.2)
BUN: 17 mg/dL (ref 6–23)
CHLORIDE: 101 meq/L (ref 96–112)
CO2: 27 mEq/L (ref 19–32)
CREATININE: 0.84 mg/dL (ref 0.50–1.35)
Calcium: 9.5 mg/dL (ref 8.4–10.5)
GFR calc Af Amer: >90 mL/min (ref >90)
GFR calc non Af Amer: 88 mL/min — ABNORMAL LOW (ref >90)
Glucose, Bld: 119 mg/dL — ABNORMAL HIGH (ref 70–99)
Potassium: 4.8 mEq/L (ref 3.7–5.3)
Sodium: 139 mEq/L (ref 137–147)
TOTAL PROTEIN: 7.3 g/dL (ref 6.0–8.3)

## 2013-12-15 LAB — LIPID PANEL
CHOL/HDL RATIO: 2.7 ratio
Cholesterol: 109 mg/dL (ref 0–200)
HDL: 41 mg/dL (ref >39)
LDL Cholesterol: 43 mg/dL (ref 0–99)
TRIGLYCERIDES: 127 mg/dL (ref <150)
VLDL: 25 mg/dL (ref 0–40)

## 2013-12-15 LAB — CBC WITH DIFFERENTIAL/PLATELET
BASOS ABS: 0 10*3/uL (ref 0.0–0.1)
Basophils Relative: 0 % (ref 0–1)
EOS ABS: 0.1 10*3/uL (ref 0.0–0.7)
EOS PCT: 2 % (ref 0–5)
HCT: 42.7 % (ref 39.0–52.0)
HEMOGLOBIN: 14.3 g/dL (ref 13.0–17.0)
Lymphocytes Relative: 32 % (ref 12–46)
Lymphs Abs: 1.3 10*3/uL (ref 0.7–4.0)
MCH: 33.3 pg (ref 26.0–34.0)
MCHC: 33.5 g/dL (ref 30.0–36.0)
MCV: 99.3 fL (ref 78.0–100.0)
MONO ABS: 0.5 10*3/uL (ref 0.1–1.0)
MONOS PCT: 11 % (ref 3–12)
NEUTROS ABS: 2.2 10*3/uL (ref 1.7–7.7)
Neutrophils Relative %: 55 % (ref 43–77)
Platelets: 165 10*3/uL (ref 150–400)
RBC: 4.3 MIL/uL (ref 4.22–5.81)
RDW: 12.6 % (ref 11.5–15.5)
WBC: 4.1 10*3/uL (ref 4.0–10.5)

## 2013-12-15 LAB — HEMOGLOBIN A1C
Hgb A1c MFr Bld: 5.4 % (ref <5.7)
Mean Plasma Glucose: 108 mg/dL (ref <117)

## 2013-12-15 LAB — T4, FREE: FREE T4: 2.21 ng/dL — AB (ref 0.80–1.80)

## 2013-12-15 LAB — PROTIME-INR
INR: 1.11 (ref 0.00–1.49)
Prothrombin Time: 14.1 seconds (ref 11.6–15.2)

## 2013-12-15 LAB — TSH: TSH: 0.144 u[IU]/mL — AB (ref 0.350–4.500)

## 2013-12-15 SURGERY — LOOP RECORDER IMPLANT
Anesthesia: LOCAL

## 2013-12-15 MED ORDER — CLOPIDOGREL BISULFATE 75 MG PO TABS: 75.0000 mg | ORAL_TABLET | Freq: Every day | ORAL | Status: DC

## 2013-12-15 MED ORDER — SENNOSIDES-DOCUSATE SODIUM 8.6-50 MG PO TABS: 1.0000 | ORAL_TABLET | Freq: Every evening | ORAL | Status: DC | PRN

## 2013-12-15 MED ORDER — ATORVASTATIN CALCIUM 80 MG PO TABS
80.0000 mg | ORAL_TABLET | Freq: Every day | ORAL | Status: DC
  Administered 2013-12-15: 80 mg via ORAL
  Filled 2013-12-15: qty 1

## 2013-12-15 MED ORDER — LIDOCAINE HCL (PF) 1 % IJ SOLN
INTRAMUSCULAR | Status: AC
  Filled 2013-12-15: qty 30

## 2013-12-15 MED ORDER — PANTOPRAZOLE SODIUM 40 MG PO TBEC
40.0000 mg | DELAYED_RELEASE_TABLET | Freq: Every day | ORAL | Status: DC
  Administered 2013-12-15: 40 mg via ORAL
  Filled 2013-12-15: qty 1

## 2013-12-15 MED ORDER — ACETAMINOPHEN 325 MG PO TABS
650.0000 mg | ORAL_TABLET | Freq: Four times a day (QID) | ORAL | Status: DC | PRN
  Administered 2013-12-15: 650 mg via ORAL
  Filled 2013-12-15: qty 2

## 2013-12-15 MED ORDER — ENOXAPARIN SODIUM 40 MG/0.4ML ~~LOC~~ SOLN
40.0000 mg | SUBCUTANEOUS | Status: DC
  Administered 2013-12-15: 40 mg via SUBCUTANEOUS
  Filled 2013-12-15 (×2): qty 0.4

## 2013-12-15 MED ORDER — LEVOTHYROXINE SODIUM 150 MCG PO TABS
300.0000 ug | ORAL_TABLET | Freq: Every day | ORAL | Status: DC
  Administered 2013-12-15: 300 ug via ORAL
  Filled 2013-12-15 (×2): qty 2

## 2013-12-15 MED ORDER — CLOPIDOGREL BISULFATE 75 MG PO TABS
75.0000 mg | ORAL_TABLET | Freq: Every day | ORAL | Status: DC
  Administered 2013-12-15: 75 mg via ORAL
  Filled 2013-12-15 (×2): qty 1

## 2013-12-15 NOTE — CV Procedure (Signed)
SURGEON:  Hillis Range, MD     PREPROCEDURE DIAGNOSIS:  Cryptogenic Stroke    POSTPROCEDURE DIAGNOSIS:  Cryptogenic Stroke     PROCEDURES:   1. Implantable loop recorder implantation    INTRODUCTION:  Brandon Soto is a 68 y.o. male with a history of unexplained stroke who presents today for implantable loop implantation.  The patient has had a cryptogenic stroke.  Despite an extensive workup by neurology, no reversible causes have been identified.  he has worn telemetry during which he did not have arrhythmias.  There is significant concern for possible atrial fibrillation as the cause for the patients stroke.  The patient therefore presents today for implantable loop implantation.     DESCRIPTION OF PROCEDURE:  Informed written consent was obtained, and the patient was brought to the electrophysiology lab in a fasting state.  The patient required no sedation for the procedure today.  Mapping over the patient's chest was performed by the EP lab staff to identify the area where electrograms were most prominent for ILR recording.  This area was found to be the left parasternal region over the 3rd-4th intercostal space. The patients left chest was therefore prepped and draped in the usual sterile fashion by the EP lab staff. The skin overlying the left parasternal region was infiltrated with lidocaine for local analgesia.  A 0.5-cm incision was made over the left parasternal region over the 3rd intercostal space.  A subcutaneous ILR pocket was fashioned using a combination of sharp and blunt dissection.  A [employer] Reveal [place] model [id number] implantable loop recorder was then placed into the pocket  R waves were very prominent and measured 0.36mV.  Steri- Strips and a sterile dressing were then applied.  There were no early apparent complications.     CONCLUSIONS:   1. Successful implantation of a Medtronic Reveal LINQ implantable loop recorder for cryptogenic stroke  2. No early apparent  complications.

## 2013-12-15 NOTE — Evaluation (Signed)
Clinical/Bedside Swallow Evaluation Patient Details  Name: Brandon Soto MRN: [id number] Date of Birth: 01-Aug-1945  Today's Date: 12/15/2013 Time: 4431-5400 SLP Time Calculation (min): 12 min  Past Medical History:  Past Medical History  Diagnosis Date  . Hypertension   . Hypercholesteremia   . H/O dizziness   . BPH (benign prostatic hyperplasia)   . PONV (postoperative nausea and vomiting)   . Coronary artery disease   . Heart attack 2001  . Hypothyroidism     "shrank w/radiation ?1990's" (06/27/2012)  . GERD (gastroesophageal reflux disease)   . Left knee DJD   . Rheumatoid arthritis(714.0)     "all over; hands especially" (06/27/2012)  . Dysphagia    Past Surgical History:  Past Surgical History  Procedure Laterality Date  . Coronary angioplasty with stent placement  2001    "1" (06/27/2012)  . Cardiac catheterization  ~ 2009  . Shoulder arthroscopy with rotator cuff repair and subacromial decompression  2005    "left" (06/27/2012)  . Coronary angioplasty    . Replacement unicondylar joint knee  06/27/2012    "left" (06/27/2012)  . Cholecystectomy  ~ 2006  . Joint replacement    . Partial knee arthroplasty  06/27/2012    Procedure: UNICOMPARTMENTAL KNEE;  Surgeon: Nilda Simmer, MD;  Location: [facility] OR;  Service: Orthopedics;  Laterality: Left;  left unicompartmental knee   HPI:  68 yo male admitted to [facility] with slurred speech - Pt found to have a right midfrontal CVA per MRI.  PMH + for GERD for which pt has been taking omeprazole x 30 days with improvement. Recently pt had bronchitis and treated with Zpac per pt.   BSE ordered as pt failed an RN stroke swallow screen.  Spouse present and reports pt is slightly slower to respond to questions.    Assessment / Plan / Recommendation Clinical Impression  Pt presents with functional oropharyngeal swallow ability based on clinical swallow evaluation - he does not have focal CN deficits.  Observed pt consuming juice, cracker, and  applesauce with timely swallow, clear voice throughout and no oral stasis.  Pt has h/o GERD and takes omeprazole, SLP advised to general reflux precautions given diagnosis.  Rec regular/thin - no follow up for swallow ability.  SLE to be completed as ordered.      Aspiration Risk  Mild    Diet Recommendation Regular;Thin liquid   Liquid Administration via: Cup;Straw Medication Administration: Whole meds with liquid Supervision: Patient able to self feed Compensations: Slow rate;Small sips/bites Postural Changes and/or Swallow Maneuvers: Seated upright 90 degrees;Upright 30-60 min after meal    Other  Recommendations   n/a  Follow Up Recommendations  None    Frequency and Duration   n/a     Pertinent Vitals/Pain Afebrile, decreased    Swallow Study Prior Functional Status   consumed regular/thin diet prior to admit    General Date of Onset: 12/15/13 HPI: 68 yo male admitted to [facility] with slurred speech - Pt found to have a right midfrontal CVA per MRI.  PMH + for GERD for which pt has been taking omeprazole x 30 days with improvement. Recently pt had bronchitis and treated with Zpac per pt.   BSE ordered as pt failed an RN stroke swallow screen.  Spouse present and reports pt is slightly slower to respond to questions.  Type of Study: Bedside swallow evaluation Diet Prior to this Study: NPO Temperature Spikes Noted: No Respiratory Status: Room air History of Recent Intubation: No  Oral Cavity - Dentition: Adequate natural dentition Self-Feeding Abilities: Able to feed self Patient Positioning: Upright in bed Baseline Vocal Quality: Clear Volitional Cough: Strong Volitional Swallow: Able to elicit    Oral/Motor/Sensory Function Overall Oral Motor/Sensory Function: Appears within functional limits for tasks assessed   Ice Chips Ice chips: Not tested   Thin Liquid Thin Liquid: Within functional limits Presentation: Cup;Self Fed;Straw    Nectar Thick Nectar Thick Liquid: Not  tested   Honey Thick Honey Thick Liquid: Not tested   Puree Puree: Within functional limits Presentation: Spoon;Self Fed   Solid   GO    Solid: Within functional limits Presentation: Self Lisabeth Pick, MS [facility] SLP [phone]

## 2013-12-15 NOTE — Consult Note (Signed)
  ELECTROPHYSIOLOGY CONSULT NOTE  Patient ID: Brandon Soto MRN: [id number], DOB/AGE: 09/03/1945   Admit date: 12/14/2013 Date of Consult: 12/15/2013  Primary Physician: KELLY,SAM, MD Primary Cardiologist: Berry Reason for Consultation: Cryptogenic stroke; recommendations regarding Implantable Loop Recorder  History of Present Illness Brandon Soto was admitted on 12/14/2013 with expressive aphasia.  Imaging demonstrated a moderate right mid frontal lobe infarct as well as a right cerebellar infarct.  He has undergone workup for stroke including echocardiogram and carotid dopplers.  The patient has been monitored on telemetry which has demonstrated sinus rhythm with no arrhythmias.    Past medical history includes coronary artery disease s/p LAD intervention in 2001 (last cath 2004 demonstrated patent stent), hypertension, hypothyroidism.    Echocardiogram this admission demonstrated EF 50-55%, hypokinesis of apical anteroseptal myocardium, grade 1 diastolic dysfunction, LA moderately dilated (46).  Lab work is remarkable for TSH 0.144, FT4 2.21.  Prior to admission, the patient denies chest pain, shortness of breath, dizziness, palpitations, or syncope.  They are recovering from their stroke with plans to return home at discharge.  EP has been asked to evaluate for placement of an implantable loop recorder to monitor for atrial fibrillation.  ROS is negative except as outlined above.    Past Medical History  Diagnosis Date  . Hypertension   . Hypercholesteremia   . H/O dizziness   . BPH (benign prostatic hyperplasia)   . PONV (postoperative nausea and vomiting)   . Coronary artery disease   . Heart attack 2001  . Hypothyroidism     "shrank w/radiation ?1990's" (06/27/2012)  . GERD (gastroesophageal reflux disease)   . Left knee DJD   . Rheumatoid arthritis(714.0)     "all over; hands especially" (06/27/2012)  . Dysphagia      Surgical History:  Past Surgical History    Procedure Laterality Date  . Coronary angioplasty with stent placement  2001    "1" (06/27/2012)  . Cardiac catheterization  ~ 2009  . Shoulder arthroscopy with rotator cuff repair and subacromial decompression  2005    "left" (06/27/2012)  . Coronary angioplasty    . Replacement unicondylar joint knee  06/27/2012    "left" (06/27/2012)  . Cholecystectomy  ~ 2006  . Joint replacement    . Partial knee arthroplasty  06/27/2012    Procedure: UNICOMPARTMENTAL KNEE;  Surgeon: Robert A Wainer, MD;  Location: [facility] OR;  Service: Orthopedics;  Laterality: Left;  left unicompartmental knee     Prescriptions prior to admission  Medication Sig Dispense Refill  . acetaminophen (TYLENOL) 325 MG tablet Take 650 mg by mouth every 6 (six) hours as needed for mild pain. For pain in hands      . aspirin EC 81 MG tablet Take 81 mg by mouth every morning.      . atenolol (TENORMIN) 25 MG tablet Take 0.5 tablets (12.5 mg total) by mouth daily.  90 tablet  3  . levothyroxine (SYNTHROID, LEVOTHROID) 300 MCG tablet Take 300 mcg by mouth daily before breakfast.      . meloxicam (MOBIC) 15 MG tablet Take 15 mg by mouth daily.      . Multiple Vitamin (MULTIVITAMIN WITH MINERALS) TABS Take 1 tablet by mouth daily.      . omega-3 acid ethyl esters (LOVAZA) 1 G capsule Take 2 g by mouth daily. Taking the [employer] brand      . omeprazole (PRILOSEC) 40 MG capsule Take 40 mg by mouth 2 (two) times daily.      .   rosuvastatin (CRESTOR) 20 MG tablet Take 1 tablet (20 mg total) by mouth at bedtime.  90 tablet  3  . tadalafil (CIALIS) 10 MG tablet Take 1 tablet (10 mg total) by mouth daily as needed for erectile dysfunction.  10 tablet  3  . [DISCONTINUED] amoxicillin (AMOXIL) 500 MG capsule Take 500 mg by mouth See admin instructions. Take 2 capsules 1 hour prior to dental appt and 2 capsules 1 hour post dental work      . [DISCONTINUED] losartan (COZAAR) 50 MG tablet Take 50 mg by mouth every morning.        Inpatient Medications:   . atorvastatin  80 mg Oral q1800  . clopidogrel  75 mg Oral Q breakfast  . enoxaparin (LOVENOX) injection  40 mg Subcutaneous Q24H  . levothyroxine  300 mcg Oral QAC breakfast  . pantoprazole  40 mg Oral Daily    Allergies:  Allergies  Allergen Reactions  . Penicillins     Pt unsure of reaction    History   Social History  . Marital Status: Married    Spouse Name: N/A    Number of Children: N/A  . Years of Education: N/A   Occupational History  . Not on file.   Social History Main Topics  . Smoking status: Never Smoker   . Smokeless tobacco: Never Used  . Alcohol Use: No  . Drug Use: No  . Sexual Activity: Yes   Other Topics Concern  . Not on file   Social History Narrative  . No narrative on file     Family History  Problem Relation Age of Onset  . Heart attack Mother   . Heart disease Brother   . Cancer Brother   . Cancer - Lung Brother     BP 114/67  Pulse 54  Temp(Src) 98.4 F (36.9 C) (Oral)  Resp 18  Ht 6\' 1"  (1.854 m)  Wt 232 lb 9.4 oz (105.5 kg)  BMI 30.69 kg/m2  SpO2 94%  Physical Exam: Filed Vitals:   12/15/13 0400 12/15/13 0601 12/15/13 0949 12/15/13 1358  BP: 116/63 122/58 137/80 114/67  Pulse: 57 54 55 54  Temp: 97.6 F (36.4 C) 97.5 F (36.4 C) 97.8 F (36.6 C) 98.4 F (36.9 C)  TempSrc: Oral Oral Oral Oral  Resp: 18  18 18   Height:      Weight:      SpO2: 98% 100% 96% 94%    GEN- The patient is well appearing, alert and oriented x 3 today.   Head- normocephalic, atraumatic Eyes-  Sclera clear, conjunctiva pink Ears- hearing intact Oropharynx- clear Neck- supple, no JVP Lymph- no cervical lymphadenopathy Lungs- Clear to ausculation bilaterally, normal work of breathing Heart- Regular rate and rhythm, no murmurs, rubs or gallops, PMI not laterally displaced GI- soft, NT, ND, + BS Extremities- no clubbing, cyanosis, or edema MS- no significant deformity or atrophy Skin- no rash or lesion Psych- euthymic mood, full  affect Neuro- strength and sensation are intact  Labs:   Lab Results  Component Value Date   WBC 4.1 12/15/2013   HGB 14.3 12/15/2013   HCT 42.7 12/15/2013   MCV 99.3 12/15/2013   PLT 165 12/15/2013    Recent Labs Lab 12/15/13 0840  NA 139  K 4.8  CL 101  CO2 27  BUN 17  CREATININE 0.84  CALCIUM 9.5  PROT 7.3  BILITOT 1.1  ALKPHOS 52  ALT 36  AST 30  GLUCOSE 119*  Radiology/Studies: Ct Head Wo Contrast 12/14/2013   CLINICAL DATA:  Dysphasia.  EXAM: CT HEAD WITHOUT CONTRAST  TECHNIQUE: Contiguous axial images were obtained from the base of the skull through the vertex without intravenous contrast.  COMPARISON:  None.  FINDINGS: Bony calvarium appears intact. No mass effect or midline shift is noted. Ventricular size is within normal limits. There is no evidence of mass lesion, hemorrhage or acute infarction. Rounded low density is noted in right basal ganglia consistent with old lacunar infarction.  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 No acute intracranial abnormality seen.   Electronically Signed   By: Dezi Brauner  Green M.D.   On: 12/14/2013 19:44   Mr Angiogram Head Wo Contrast 12/14/2013   CLINICAL DATA:  Dysphagia for 2 days becoming worse. Hypertension and hypercholesterolemia.  EXAM: MRI HEAD WITHOUT CONTRAST  MRA HEAD WITHOUT CONTRAST  TECHNIQUE: Multiplanar, multiecho pulse sequences of the brain and surrounding structures were obtained without intravenous contrast. Angiographic images of the head were obtained using MRA technique without contrast.  COMPARISON:  12/14/2013 CT.  No comparison MR.  FINDINGS: MRI HEAD FINDINGS  Moderate size nonhemorrhagic right mid frontal lobe infarct extending to the operculum region.  Small acute infarct right cerebellum.  Involvement of two vascular distributions raises possibility of embolic disease.  Mild to moderate nonspecific white matter type changes which in the present clinical setting is most consistent with result of  small vessel disease.  No intracranial mass lesion noted on this unenhanced exam.  No hydrocephalus.  Prominent peri vascular space right basal ganglia.  Minimal to mild paranasal sinus mucosal thickening.  MRA HEAD FINDINGS  Anterior circulation without medium or large size vessel significant stenosis or occlusion.  Mild to moderate irregularity middle cerebral artery branches greater on the right. Decreased number of visualized right middle cerebral artery branch vessels consistent with patient's right frontal lobe infarct.  Left vertebral artery is dominant. No significant stenosis of the distal vertebral arteries or basilar artery. Basilar artery is mildly ectatic.  Poor delineation of the anterior inferior cerebellar arteries.  Slight irregularity left posterior cerebral artery distal branches.  No aneurysm noted.  IMPRESSION: MRI HEAD:  Moderate size nonhemorrhagic right mid frontal lobe infarct extending to the operculum region.  Small acute infarct right cerebellum.  Involvement of two vascular distributions raises possibility of embolic disease.  Mild to moderate nonspecific white matter type changes which in the present clinical setting is most consistent with result of small vessel disease.  MRA HEAD:  Anterior circulation without medium or large size vessel significant stenosis or occlusion.  Mild to moderate irregularity middle cerebral artery branches greater on the right. Decreased number of visualized right middle cerebral artery branch vessels consistent with patient's right frontal lobe infarct.  Left vertebral artery is dominant. No significant stenosis of the distal vertebral arteries or basilar artery. Basilar artery is mildly ectatic.  Poor delineation of the anterior inferior cerebellar arteries.  Slight irregularity left posterior cerebral artery distal branches.   Electronically Signed   By: Steve  Olson M.D.   On: 12/14/2013 21:36    12-lead ECG sinus rhythm, rate 59, PR 259, otherwise  normal intervals  Telemetry sinus rhythm with occasional blocked PAC's  Assessment and Plan:  1. Cryptogenic stroke The patient presents with cryptogenic stroke.   I spoke at length with the patient about monitoring for afib with either a 30 day event monitor or an implantable loop recorder.  Risks, benefits, and alteratives to implantable loop recorder   were discussed with the patient today.   At this time, the patient is very clear in their decision to proceed with implantable loop recorder.   Please call with questions.  Hillis Range MD

## 2013-12-15 NOTE — Progress Notes (Signed)
Stroke Team Progress Note  HISTORY Brandon Soto is a LH 68 y.o. male who reports that on going to bed on 5/19 at 2230 noted that he was having difficulty finding the right words to say. Went to bed and awakened on the 20th and continued to have some problems with speech but did not feel that they were severe. On there 21st the patient felt that his speech was worse. He knew what he wanted to say but could not get the words out appropriately. The patient presented for evaluation at that time. Patient is on ASA at home. Patient was not administered TPA secondary to delay in arrival. He was admitted for further evaluation and treatment.  SUBJECTIVE His family is at the bedside.  Overall he feels his condition is gradually improving.   OBJECTIVE Most recent Vital Signs: Filed Vitals:   12/15/13 0130 12/15/13 0400 12/15/13 0601 12/15/13 0949  BP: 125/57 116/63 122/58 137/80  Pulse: 52 57 54 55  Temp: 97.6 F (36.4 C) 97.6 F (36.4 C) 97.5 F (36.4 C) 97.8 F (36.6 C)  TempSrc: Oral Oral Oral Oral  Resp: 20 18  18   Height:      Weight:      SpO2: 95% 98% 100% 96%   CBG (last 3)  No results found for this basename: GLUCAP,  in the last 72 hours  IV Fluid Intake:     MEDICATIONS  . atorvastatin  80 mg Oral q1800  . clopidogrel  75 mg Oral Q breakfast  . enoxaparin (LOVENOX) injection  40 mg Subcutaneous Q24H  . levothyroxine  300 mcg Oral QAC breakfast  . pantoprazole  40 mg Oral Daily   PRN:  acetaminophen, senna-docusate  Diet:  Cardiac thin liquids Activity:  OOB with assistance DVT Prophylaxis:  Lovenox 40 mg sq daily   CLINICALLY SIGNIFICANT STUDIES Basic Metabolic Panel:  Recent Labs Lab 12/14/13 1900 12/15/13 0840  NA 139 139  K 4.4 4.8  CL 101 101  CO2 25 27  GLUCOSE 98 119*  BUN 23 17  CREATININE 0.93 0.84  CALCIUM 9.5 9.5   Liver Function Tests:  Recent Labs Lab 12/14/13 1900 12/15/13 0840  AST 26 30  ALT 33 36  ALKPHOS 50 52  BILITOT 0.8 1.1  PROT  7.3 7.3  ALBUMIN 3.9 3.8   CBC:  Recent Labs Lab 12/14/13 1900 12/15/13 0840  WBC 6.0 4.1  NEUTROABS 3.9 2.2  HGB 12.7* 14.3  HCT 38.2* 42.7  MCV 97.7 99.3  PLT 161 165   Coagulation:  Recent Labs Lab 12/14/13 1900  LABPROT 14.2  INR 1.12   Cardiac Enzymes: No results found for this basename: CKTOTAL, CKMB, CKMBINDEX, TROPONINI,  in the last 168 hours Urinalysis:  Recent Labs Lab 12/14/13 1924  COLORURINE YELLOW  LABSPEC 1.016  PHURINE 6.0  GLUCOSEU NEGATIVE  HGBUR NEGATIVE  BILIRUBINUR NEGATIVE  KETONESUR 15*  PROTEINUR NEGATIVE  UROBILINOGEN 0.2  NITRITE NEGATIVE  LEUKOCYTESUR NEGATIVE   Lipid Panel    Component Value Date/Time   CHOL 109 12/15/2013 0840   TRIG 127 12/15/2013 0840   HDL 41 12/15/2013 0840   CHOLHDL 2.7 12/15/2013 0840   VLDL 25 12/15/2013 0840   LDLCALC 43 12/15/2013 0840   HgbA1C  No results found for this basename: HGBA1C    Urine Drug Screen:     Component Value Date/Time   LABOPIA NONE DETECTED 12/14/2013 1924   COCAINSCRNUR NONE DETECTED 12/14/2013 1924   LABBENZ NONE DETECTED 12/14/2013 1924  AMPHETMU NONE DETECTED 12/14/2013 1924   THCU NONE DETECTED 12/14/2013 1924   LABBARB NONE DETECTED 12/14/2013 1924    Alcohol Level:  Recent Labs Lab 12/14/13 1900  ETH <11    CT of the brain  12/14/2013    Old lacunar infarction in right basal ganglia. No acute intracranial abnormality seen.   MRI of the brain  12/14/2013     Moderate size nonhemorrhagic right mid frontal lobe infarct extending to the operculum region.  Small acute infarct right cerebellum.  Involvement of two vascular distributions raises possibility of embolic disease.  Mild to moderate nonspecific white matter type changes which in the present clinical setting is most consistent with result of small vessel disease.   MRA of the brain  12/14/2013     Anterior circulation without medium or large size vessel significant stenosis or occlusion.  Mild to moderate irregularity  middle cerebral artery branches greater on the right. Decreased number of visualized right middle cerebral artery branch vessels consistent with patient's right frontal lobe infarct.  Left vertebral artery is dominant. No significant stenosis of the distal vertebral arteries or basilar artery. Basilar artery is mildly ectatic.  Poor delineation of the anterior inferior cerebellar arteries.  Slight irregularity left posterior cerebral artery distal branches.   2D Echocardiogram  of 50% to 55%. There is hypokinesis of the apicalanteroseptal myocardium.  Carotid Doppler    Therapy Recommendations   Physical Exam   Awake alert. Afebrile. Head is nontraumatic. Neck is supple without bruit. Hearing is normal. Cardiac exam no murmur or gallop. Lungs are clear to auscultation. Distal pulses are well felt. Neurological Exam ;  Awake  Alert oriented x 3. Normal speech and language.eye movements full without nystagmus.fundi were not visualized. Vision acuity and fields appear normal. Hearing is normal. Palatal movements are normal. Face symmetric. Tongue midline. Normal strength, tone, reflexes and coordination. Normal sensation. Gait deferred. ASSESSMENT Mr. Brandon Soto is a 68 y.o. left handed male presenting with expressive aphasia. Imaging confirms a moderate right mid frontal infarct as well as a right cerebellar infarct.  Infarct felt to be embolic secondary to unknown source, suspect atrial fibrillation.  On aspirin 81 mg orally every day prior to admission. Now on clopidogrel 75 mg orally every day for secondary stroke prevention. Patient with resultant mild expressive aphasia. Stroke work up underway.  hypertension Hyperlipidemia, LDL 43, on crestor 20 mg, lovaza dialy PTA, now on lipitor 80 mg daily, goal LDL < 100 (< 70 for diabetics) Obesity, Body mass index is 30.69 kg/(m^2).  CAD - MI, hx stent Suspect obstructive sleep apnea. Patient states he has had testing in the past by Dr. Allyson Sabal. He is  unsure of the results.  Hospital day # 1  TREATMENT/PLAN  Continue clopidogrel 75 mg orally every day for secondary stroke prevention. TEE to look for embolic source. Arranged with [facility] for next week. They will call patient with appt date and time.  If positive for PFO (patent foramen ovale), check bilateral lower extremity venous dopplers to rule out DVT as possible source of stroke. Patient will need to be NPO the night prior to the procedure [facility] electrophysiologist will consult and consider placement of an place implantable loop recorder to evaluate for atrial fibrillation as etiology of stroke. This has been explained to patient/family by Dr. Pearlean Brownie and they are agreeable. He would like loop placed prior to discharge (this would be prior to TEE completion) - EP  notified Therapy evals F/u carotid doppler  Consider obstructive sleep apnea if previous testing inadequate. Patient to follow up with Dr. Allyson Sabal.   Annie Main, MSN, RN, ANVP-BC, AGPCNP-BC [facility] Pager: [phone] 12/15/2013 11:00 AM  I have personally obtained a history, examined the patient, evaluated imaging results, and formulated the assessment and plan of care. I agree with the above. Delia Heady, MD   To contact Stroke Continuity provider, please refer to [email or web address]. After hours, contact General Neurology

## 2013-12-15 NOTE — Progress Notes (Signed)
PT Cancellation Note  Patient Details Name: Brandon Soto MRN: [id number] DOB: Sep 22, 1945   Cancelled Treatment:    Reason Eval/Treat Not Completed: Other (comment) (orders state to start 12/16/13 (Saturday)).     Brandon Soto, PT, DPT [phone]   12/15/2013, 4:12 PM

## 2013-12-15 NOTE — Progress Notes (Signed)
Nutrition Brief Note  Patient identified on the Malnutrition Screening Tool (MST) Report  Wt Readings from Last 15 Encounters:  12/14/13 232 lb 9.4 oz (105.5 kg)  12/29/12 233 lb 9.6 oz (105.96 kg)  06/20/12 242 lb 3.2 oz (109.861 kg)  06/15/12 240 lb (108.863 kg)    Body mass index is 30.69 kg/(m^2). Patient meets criteria for Obesity based on current BMI. Pt states that he usually weighs 235 lbs. Since starting his thyroid medication 30 days ago, he has noticed a decreased appetite and states he has been eating about 75% of his usual meals. Reported 3 lbs weight loss (<2% of weight), not significant for time frame.  Current diet order is Heart Healthy. Pt has not had a chance to eat since diet advanced this AM. Pt reports his appetite is fair. RD discussed limitations of current diet order with pt. Pt denies any nutrition-related questions or concerns. Labs and medications reviewed.   No nutrition interventions warranted at this time. If nutrition issues arise, please consult RD.   Ian Malkin RD, LDN Inpatient Clinical Dietitian Pager: [phone] After Hours Pager: [phone]

## 2013-12-15 NOTE — Progress Notes (Signed)
Pt A&O x4; pt discharge education and instructions completed with both pt and spouse at bedside. Both voices understanding and denies any questions. All lines including pt IV and telemetry removed from pt. Pt handed printout information of stroke along with his prescription. Pt educated on his new loop recorder implant. Pt unable to watch the video but charge RN Asher Muir came to educate pt on the device. Pt given [employer] comes with video and both pt and spouse advised to watch video when they go home and to contact the [employer] with any questions or concerns. Pt transported off unit via wheelchair with spouse and belongings at side. Arabella Merles Fredrik Mogel RN.

## 2013-12-15 NOTE — Consult Note (Signed)
Referring Physician: Allena Katz    Chief Complaint: Difficulty with speech  HPI: Brandon Soto is a LH 68 y.o. male who reports that on going to bed on 5/19 noted that he was having difficulty finding the right words to say.  Went to bed and awakened on the 20th and continued to have some problems with speech but did not feel that they were severe.  On there 21st the patient felt that his speech was worse.  He knew what he wanted to say but could not get the words out appropriately.  The patient presented for evaluation at that time.  Patient is on ASA at home.    Date last known well: Date: 12/12/2013 Time last known well: Time: 22:30 tPA Given: No: Outside time window  Past Medical History  Diagnosis Date  . Hypertension   . Hypercholesteremia   . H/O dizziness   . BPH (benign prostatic hyperplasia)   . PONV (postoperative nausea and vomiting)   . Coronary artery disease   . Heart attack 2001  . Hypothyroidism     "shrank w/radiation ?1990's" (06/27/2012)  . GERD (gastroesophageal reflux disease)   . Left knee DJD   . Rheumatoid arthritis(714.0)     "all over; hands especially" (06/27/2012)  . Dysphagia     Past Surgical History  Procedure Laterality Date  . Coronary angioplasty with stent placement  2001    "1" (06/27/2012)  . Cardiac catheterization  ~ 2009  . Shoulder arthroscopy with rotator cuff repair and subacromial decompression  2005    "left" (06/27/2012)  . Coronary angioplasty    . Replacement unicondylar joint knee  06/27/2012    "left" (06/27/2012)  . Cholecystectomy  ~ 2006  . Joint replacement    . Partial knee arthroplasty  06/27/2012    Procedure: UNICOMPARTMENTAL KNEE;  Surgeon: Nilda Simmer, MD;  Location: [facility] OR;  Service: Orthopedics;  Laterality: Left;  left unicompartmental knee    Family History  Problem Relation Age of Onset  . Heart attack Mother   . Heart disease Brother   . Cancer Brother   . Cancer - Lung Brother    Social History:  reports that  he has never smoked. He has never used smokeless tobacco. He reports that he does not drink alcohol or use illicit drugs.  Allergies:  Allergies  Allergen Reactions  . Penicillins     Pt unsure of reaction    Medications:  I have reviewed the patient's current medications. Prior to Admission:  Prescriptions prior to admission  Medication Sig Dispense Refill  . acetaminophen (TYLENOL) 325 MG tablet Take 650 mg by mouth every 6 (six) hours as needed for mild pain. For pain in hands      . aspirin EC 81 MG tablet Take 81 mg by mouth every morning.      [name] atenolol (TENORMIN) 25 MG tablet Take 0.5 tablets (12.5 mg total) by mouth daily.  90 tablet  3  . levothyroxine (SYNTHROID, LEVOTHROID) 300 MCG tablet Take 300 mcg by mouth daily before breakfast.      . meloxicam (MOBIC) 15 MG tablet Take 15 mg by mouth daily.      . Multiple Vitamin (MULTIVITAMIN WITH MINERALS) TABS Take 1 tablet by mouth daily.      [name] omega-3 acid ethyl esters (LOVAZA) 1 G capsule Take 2 g by mouth daily. Taking the [facility] brand      . omeprazole (PRILOSEC) 40 MG capsule Take 40 mg by mouth  2 (two) times daily.      . rosuvastatin (CRESTOR) 20 MG tablet Take 1 tablet (20 mg total) by mouth at bedtime.  90 tablet  3  . tadalafil (CIALIS) 10 MG tablet Take 1 tablet (10 mg total) by mouth daily as needed for erectile dysfunction.  10 tablet  3  . [DISCONTINUED] amoxicillin (AMOXIL) 500 MG capsule Take 500 mg by mouth See admin instructions. Take 2 capsules 1 hour prior to dental appt and 2 capsules 1 hour post dental work      . [DISCONTINUED] losartan (COZAAR) 50 MG tablet Take 50 mg by mouth every morning.       Scheduled: . atorvastatin  80 mg Oral q1800  . clopidogrel  75 mg Oral Q breakfast  . enoxaparin (LOVENOX) injection  40 mg Subcutaneous Q24H  . levothyroxine  300 mcg Oral QAC breakfast  . pantoprazole  40 mg Oral Daily    ROS: History obtained from the patient  General ROS: negative for - chills, fatigue,  fever, night sweats, weight gain or weight loss Psychological ROS: negative for - behavioral disorder, hallucinations, memory difficulties, mood swings or suicidal ideation Ophthalmic ROS: negative for - blurry vision, double vision, eye pain or loss of vision ENT ROS: negative for - epistaxis, nasal discharge, oral lesions, sore throat, tinnitus or vertigo Allergy and Immunology ROS: negative for - hives or itchy/watery eyes Hematological and Lymphatic ROS: negative for - bleeding problems, bruising or swollen lymph nodes Endocrine ROS: negative for - galactorrhea, hair pattern changes, polydipsia/polyuria or temperature intolerance Respiratory ROS: negative for - cough, hemoptysis, shortness of breath or wheezing Cardiovascular ROS: negative for - chest pain, dyspnea on exertion, edema or irregular heartbeat Gastrointestinal ROS: negative for - abdominal pain, diarrhea, hematemesis, nausea/vomiting or stool incontinence Genito-Urinary ROS: negative for - dysuria, hematuria, incontinence or urinary frequency/urgency Musculoskeletal ROS: negative for - joint swelling or muscular weakness Neurological ROS: as noted in HPI Dermatological ROS: negative for rash and skin lesion changes  Physical Examination: Blood pressure 132/69, pulse 55, temperature 98 F (36.7 C), temperature source Oral, resp. rate 20, height 6\' 1"  (1.854 m), weight 105.5 kg (232 lb 9.4 oz), SpO2 95.00%.  Neurologic Examination: Mental Status: Alert, oriented, thought content appropriate.  Speech fluent without evidence of aphasia.  Able to follow 3 step commands without difficulty. Cranial Nerves: II: Discs flat bilaterally; Visual fields grossly normal, pupils equal, round, reactive to light and accommodation III,IV, VI: ptosis not present, extra-ocular motions intact bilaterally V,VII: smile symmetric, facial light touch sensation normal bilaterally VIII: hearing normal bilaterally IX,X: gag reflex present XI:  bilateral shoulder shrug XII: midline tongue extension Motor: Right : Upper extremity   5/5    Left:     Upper extremity   5/5  Lower extremity   5/5     Lower extremity   5/5 Tone and bulk:normal tone throughout; no atrophy noted Sensory: Pinprick and light touch intact throughout, bilaterally Deep Tendon Reflexes: 2+ in the upper extremities and left lower extremity, 1+ in the right lower extremity.   Plantars: Right: downgoing   Left: downgoing Cerebellar: normal finger-to-nose and normal heel-to-shin test Gait: Unable to test CV: pulses palpable throughout     Laboratory Studies:  Basic Metabolic Panel:  Recent Labs Lab 12/14/13 1900  NA 139  K 4.4  CL 101  CO2 25  GLUCOSE 98  BUN 23  CREATININE 0.93  CALCIUM 9.5    Liver Function Tests:  Recent Labs Lab 12/14/13 1900  AST 26  ALT 33  ALKPHOS 50  BILITOT 0.8  PROT 7.3  ALBUMIN 3.9   No results found for this basename: LIPASE, AMYLASE,  in the last 168 hours No results found for this basename: AMMONIA,  in the last 168 hours  CBC:  Recent Labs Lab 12/14/13 1900  WBC 6.0  NEUTROABS 3.9  HGB 12.7*  HCT 38.2*  MCV 97.7  PLT 161    Cardiac Enzymes: No results found for this basename: CKTOTAL, CKMB, CKMBINDEX, TROPONINI,  in the last 168 hours  BNP: No components found with this basename: POCBNP,   CBG: No results found for this basename: GLUCAP,  in the last 168 hours  Microbiology: Results for orders placed during the hospital encounter of 06/20/12  SURGICAL PCR SCREEN     Status: None   Collection Time    06/20/12 10:02 AM      Result Value Ref Range Status   MRSA, PCR NEGATIVE  NEGATIVE Final   Staphylococcus aureus NEGATIVE  NEGATIVE Final   Comment:            The Xpert SA Assay (FDA     approved for NASAL specimens     in patients over 63 years of age),     is one component of     a comprehensive surveillance     program.  Test performance has     been validated by [employer] for patients greater     than or equal to 61 year old.     It is not intended     to diagnose infection nor to     guide or monitor treatment.    Coagulation Studies:  Recent Labs  12/14/13 1900  LABPROT 14.2  INR 1.12    Urinalysis:  Recent Labs Lab 12/14/13 1924  COLORURINE YELLOW  LABSPEC 1.016  PHURINE 6.0  GLUCOSEU NEGATIVE  HGBUR NEGATIVE  BILIRUBINUR NEGATIVE  KETONESUR 15*  PROTEINUR NEGATIVE  UROBILINOGEN 0.2  NITRITE NEGATIVE  LEUKOCYTESUR NEGATIVE    Lipid Panel: No results found for this basename: chol, trig, hdl, cholhdl, vldl, ldlcalc    HgbA1C:  No results found for this basename: HGBA1C    Urine Drug Screen:     Component Value Date/Time   LABOPIA NONE DETECTED 12/14/2013 1924   COCAINSCRNUR NONE DETECTED 12/14/2013 1924   LABBENZ NONE DETECTED 12/14/2013 1924   AMPHETMU NONE DETECTED 12/14/2013 1924   THCU NONE DETECTED 12/14/2013 1924   LABBARB NONE DETECTED 12/14/2013 1924    Alcohol Level:  Recent Labs Lab 12/14/13 1900  ETH <11    Imaging: Ct Head Wo Contrast  12/14/2013   CLINICAL DATA:  Dysphasia.  EXAM: CT HEAD WITHOUT CONTRAST  TECHNIQUE: Contiguous axial images were obtained from the base of the skull through the vertex without intravenous contrast.  COMPARISON:  None.  FINDINGS: Bony calvarium appears intact. No mass effect or midline shift is noted. Ventricular size is within normal limits. There is no evidence of mass lesion, hemorrhage or acute infarction. Rounded low density is noted in right basal ganglia consistent with old lacunar infarction.  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 No acute intracranial abnormality seen.   Electronically Signed   By: Roque Lias M.D.   On: 12/14/2013 19:44   Mr Angiogram Head Wo Contrast  12/14/2013   CLINICAL DATA:  Dysphagia for 2 days becoming worse. Hypertension and hypercholesterolemia.  EXAM: MRI HEAD WITHOUT CONTRAST  MRA  HEAD WITHOUT CONTRAST  TECHNIQUE:  Multiplanar, multiecho pulse sequences of the brain and surrounding structures were obtained without intravenous contrast. Angiographic images of the head were obtained using MRA technique without contrast.  COMPARISON:  12/14/2013 CT.  No comparison MR.  FINDINGS: MRI HEAD FINDINGS  Moderate size nonhemorrhagic right mid frontal lobe infarct extending to the operculum region.  Small acute infarct right cerebellum.  Involvement of two vascular distributions raises possibility of embolic disease.  Mild to moderate nonspecific white matter type changes which in the present clinical setting is most consistent with result of small vessel disease.  No intracranial mass lesion noted on this unenhanced exam.  No hydrocephalus.  Prominent peri vascular space right basal ganglia.  Minimal to mild paranasal sinus mucosal thickening.  MRA HEAD FINDINGS  Anterior circulation without medium or large size vessel significant stenosis or occlusion.  Mild to moderate irregularity middle cerebral artery branches greater on the right. Decreased number of visualized right middle cerebral artery branch vessels consistent with patient's right frontal lobe infarct.  Left vertebral artery is dominant. No significant stenosis of the distal vertebral arteries or basilar artery. Basilar artery is mildly ectatic.  Poor delineation of the anterior inferior cerebellar arteries.  Slight irregularity left posterior cerebral artery distal branches.  No aneurysm noted.  IMPRESSION: MRI HEAD:  Moderate size nonhemorrhagic right mid frontal lobe infarct extending to the operculum region.  Small acute infarct right cerebellum.  Involvement of two vascular distributions raises possibility of embolic disease.  Mild to moderate nonspecific white matter type changes which in the present clinical setting is most consistent with result of small vessel disease.  MRA HEAD:  Anterior circulation without medium or large size vessel significant stenosis or  occlusion.  Mild to moderate irregularity middle cerebral artery branches greater on the right. Decreased number of visualized right middle cerebral artery branch vessels consistent with patient's right frontal lobe infarct.  Left vertebral artery is dominant. No significant stenosis of the distal vertebral arteries or basilar artery. Basilar artery is mildly ectatic.  Poor delineation of the anterior inferior cerebellar arteries.  Slight irregularity left posterior cerebral artery distal branches.   Electronically Signed   By: Bridgett Larsson M.D.   On: 12/14/2013 21:36   Mr Brain Wo Contrast  12/14/2013   CLINICAL DATA:  Dysphagia for 2 days becoming worse. Hypertension and hypercholesterolemia.  EXAM: MRI HEAD WITHOUT CONTRAST  MRA HEAD WITHOUT CONTRAST  TECHNIQUE: Multiplanar, multiecho pulse sequences of the brain and surrounding structures were obtained without intravenous contrast. Angiographic images of the head were obtained using MRA technique without contrast.  COMPARISON:  12/14/2013 CT.  No comparison MR.  FINDINGS: MRI HEAD FINDINGS  Moderate size nonhemorrhagic right mid frontal lobe infarct extending to the operculum region.  Small acute infarct right cerebellum.  Involvement of two vascular distributions raises possibility of embolic disease.  Mild to moderate nonspecific white matter type changes which in the present clinical setting is most consistent with result of small vessel disease.  No intracranial mass lesion noted on this unenhanced exam.  No hydrocephalus.  Prominent peri vascular space right basal ganglia.  Minimal to mild paranasal sinus mucosal thickening.  MRA HEAD FINDINGS  Anterior circulation without medium or large size vessel significant stenosis or occlusion.  Mild to moderate irregularity middle cerebral artery branches greater on the right. Decreased number of visualized right middle cerebral artery branch vessels consistent with patient's right frontal lobe infarct.  Left  vertebral artery is dominant. No significant  stenosis of the distal vertebral arteries or basilar artery. Basilar artery is mildly ectatic.  Poor delineation of the anterior inferior cerebellar arteries.  Slight irregularity left posterior cerebral artery distal branches.  No aneurysm noted.  IMPRESSION: MRI HEAD:  Moderate size nonhemorrhagic right mid frontal lobe infarct extending to the operculum region.  Small acute infarct right cerebellum.  Involvement of two vascular distributions raises possibility of embolic disease.  Mild to moderate nonspecific white matter type changes which in the present clinical setting is most consistent with result of small vessel disease.  MRA HEAD:  Anterior circulation without medium or large size vessel significant stenosis or occlusion.  Mild to moderate irregularity middle cerebral artery branches greater on the right. Decreased number of visualized right middle cerebral artery branch vessels consistent with patient's right frontal lobe infarct.  Left vertebral artery is dominant. No significant stenosis of the distal vertebral arteries or basilar artery. Basilar artery is mildly ectatic.  Poor delineation of the anterior inferior cerebellar arteries.  Slight irregularity left posterior cerebral artery distal branches.   Electronically Signed   By: Bridgett Larsson M.D.   On: 12/14/2013 21:36    Assessment: 68 y.o. male presenting with difficulty with speech that has improved significantly.  MRI of the brain performed and reviewed. MRI shows an acute right cerebellar infarct and right mid frontal lobe infarct.  No significant vascular disease was noted.  Patient is on an aspirin a day at home.  Concern is for an embolic source.  Further work up recommended.    Stroke Risk Factors - hyperlipidemia and hypertension  Plan: 1. HgbA1c, fasting lipid panel 2. PT consult, OT consult, Speech consult 3. Echocardiogram 4. Carotid dopplers 5. Prophylactic therapy-Antiplatelet  med: Aspirin - dose 325mg  daily 6. Risk factor modification 7. Telemetry monitoring 8. Frequent neuro checks   Thana Farr, MD [facility] [phone] 12/15/2013, 12:47 AM

## 2013-12-15 NOTE — Progress Notes (Signed)
  Echocardiogram 2D Echocardiogram has been performed.  Lorn Junes 12/15/2013, 10:47 AM

## 2013-12-15 NOTE — Progress Notes (Signed)
UR complete.  Chester Sibert RN, MSN 

## 2013-12-15 NOTE — Progress Notes (Signed)
1551Pt returns back to room from procedure- loop recorder implant. dsg to site clean, dry and intact. Pt denies any pain; pt in bed with spouse at bedside and call light within reach. Will continue to monitor quietly.

## 2013-12-15 NOTE — Progress Notes (Signed)
*  PRELIMINARY RESULTS* Vascular Ultrasound Carotid Duplex (Doppler) has been completed.   Findings suggest 1-39% internal carotid artery stenosis bilaterally. Vertebral arteries are patent with antegrade flow.  12/15/2013 3:31 PM Gertie Fey, RVT, RDCS, RDMS

## 2013-12-15 NOTE — Discharge Summary (Signed)
Physician Discharge Summary  Brandon Soto PJK:[id number] DOB: Nov 27, 1945 DOA: 12/14/2013  PCP: [name], MD  Admit date: 12/14/2013 Discharge date: 12/15/2013  Time spent: 35 minutes  Recommendations for Outpatient Follow-up:  1.  Please follow up on TEE which will be done in the outpatient setting. There was concern for possible thromboembolic event leading to CVA 2.  Loop recorder placed to assess for Afib given CVA 3.  Follow up on Thyroid function tests    Discharge Diagnoses:  Principal Problem:   Acute CVA (cerebrovascular accident) Active Problems:   Thyroid disease   Hypertension   Hypercholesteremia   Discharge Condition: Stable  Diet recommendation: Heart Healthy  Filed Weights   12/14/13 1830 12/14/13 2326  Weight: 106.595 kg (235 lb) 105.5 kg (232 lb 9.4 oz)    History of present illness:  Brandon Soto is a 68 y.o. male with Past medical history of hypertension, coronary artery disease, hypothyroidism, GERD, arthritis.  The patient presented with complaints of difficulty with speaking. He mentions since last to 3 days he has multiple episodes of difficulty with speaking. And tonight he was not able to speak at all. He mentions he was able to gather his thoughts but he was not able to bring out his warts. He was able to understand. He is able to follow command. No headache no blurring of the vision no difficulty swallowing no difficulty hearing no difficulty with balance and no fall no trauma no injury or chest pain no shortness of breath.  Since last 2 days he has watery diarrhea without any blood.  No recent change no medication no antibiotics.  He has been placed on increasing dose of Synthroid due to poor response last dose change was one month ago.  The patient is coming from home. And at his baseline independent for most of his ADL.  Hospital Course:  Patient is a pleasant 68 year old gentleman with a past medical history of hypertension, coronary artery  disease, hypothyroidism who was admitted to medicine service on 12/14/2013. He presented with complaints of dysarthria which had occurred several days prior to this presentation. He wasn't seen and evaluated by neurology. Initial CT scan of brain showed old lacunar infarct without acute intracranial abnormalities. He was placed on the CVA protocol. By the following morning symptoms rapidly improved. MRI of brain performed on 12/14/2013 showing moderate-sized nonhemorrhagic right mid frontal lobe infarct and small acute infarct in right cerebellum. There was concern for the possibility of embolic phenomenon given vascular distributions. Transthoracic echocardiogram showed an ejection fraction 50-55%.carotid Dopplers were unremarkable.neurology recommending a transesophageal echocardiogram to assess for possible embolic source. This will be arranged with [facility] next week as they will call patient with appointment time and date. EP was also consulted during this hospitalization for placement of loop recorder. This was done prior to patient's discharge. Given significant improvement to symptoms he was discharged in stable condition on 12/15/2013 on Plavix 75 mg PO q daily as recommended by Neurology.   Procedures:  Transthoracic echocardiogram performed 12/13/2013 showing ejection fraction of 50-55%, grade 1 diastolic dysfunction  Carotid Dopplers performed on 12/15/2013 showing 1-39% internal carotid artery stenosis bilaterally  Consultations:  Neurology  Discharge Exam: Filed Vitals:   12/15/13 1358  BP: 114/67  Pulse: 54  Temp: 98.4 F (36.9 C)  Resp: 18    Discharge Instructions You were cared for by a hospitalist during your hospital stay. If you have any questions about your discharge medications or the  care you received while you were in the hospital after you are discharged, you can call the unit and asked to speak with the hospitalist on call if the  hospitalist that took care of you is not available. Once you are discharged, your primary care physician will handle any further medical issues. Please note that NO REFILLS for any discharge medications will be authorized once you are discharged, as it is imperative that you return to your primary care physician (or establish a relationship with a primary care physician if you do not have one) for your aftercare needs so that they can reassess your need for medications and monitor your lab values.  Discharge Instructions   Call MD for:  difficulty breathing, headache or visual disturbances    Complete by:  As directed      Call MD for:  extreme fatigue    Complete by:  As directed      Call MD for:  persistant dizziness or light-headedness    Complete by:  As directed      Call MD for:  temperature >100.4    Complete by:  As directed      Diet - low sodium heart healthy    Complete by:  As directed      Increase activity slowly    Complete by:  As directed             Medication List    STOP taking these medications       amoxicillin 500 MG capsule  Commonly known as:  AMOXIL     aspirin EC 81 MG tablet     losartan 50 MG tablet  Commonly known as:  COZAAR     tadalafil 10 MG tablet  Commonly known as:  CIALIS      TAKE these medications       acetaminophen 325 MG tablet  Commonly known as:  TYLENOL  Take 650 mg by mouth every 6 (six) hours as needed for mild pain. For pain in hands     atenolol 25 MG tablet  Commonly known as:  TENORMIN  Take 0.5 tablets (12.5 mg total) by mouth daily.     clopidogrel 75 MG tablet  Commonly known as:  PLAVIX  Take 1 tablet (75 mg total) by mouth daily with breakfast.     levothyroxine 300 MCG tablet  Commonly known as:  SYNTHROID, LEVOTHROID  Take 300 mcg by mouth daily before breakfast.     meloxicam 15 MG tablet  Commonly known as:  MOBIC  Take 15 mg by mouth daily.     multivitamin with minerals Tabs tablet  Take 1 tablet  by mouth daily.     omega-3 acid ethyl esters 1 G capsule  Commonly known as:  LOVAZA  Take 2 g by mouth daily. Taking the [facility] brand     omeprazole 40 MG capsule  Commonly known as:  PRILOSEC  Take 40 mg by mouth 2 (two) times daily.     rosuvastatin 20 MG tablet  Commonly known as:  CRESTOR  Take 1 tablet (20 mg total) by mouth at bedtime.       Allergies  Allergen Reactions  . Penicillins     Pt unsure of reaction       Follow-up Information   Follow up with Gates Rigg, MD. Schedule an appointment as soon as possible for a visit in 2 months. (Stroke Clinic)    Specialties:  Neurology, Radiology   Contact information:  [address] [place] [address] [phone]       Follow up with outpatient TEE (heart test). [facility] will call you at home with appt date and time next week. it may take a week or two to schedule)       Follow up with [facility], MD In 1 week.   Specialty:  Family Medicine   Contact information:   [address] [place] [address] [phone]       Follow up with Runell Gess, MD In 1 week.   Specialty:  Cardiology   Contact information:   [address] [place] [address] [phone]        The results of significant diagnostics from this hospitalization (including imaging, microbiology, ancillary and laboratory) are listed below for reference.    Significant Diagnostic Studies: Ct Head Wo Contrast  12/14/2013   CLINICAL DATA:  Dysphasia.  EXAM: CT HEAD WITHOUT CONTRAST  TECHNIQUE: Contiguous axial images were obtained from the base of the skull through the vertex without intravenous contrast.  COMPARISON:  None.  FINDINGS: Bony calvarium appears intact. No mass effect or midline shift is noted. Ventricular size is within normal limits. There is no evidence of mass lesion, hemorrhage or acute infarction. Rounded low density is noted in right basal ganglia  consistent with old lacunar infarction.  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 No acute intracranial abnormality seen.   Electronically Signed   By: Roque Lias M.D.   On: 12/14/2013 19:44   Mr Angiogram Head Wo Contrast  12/14/2013   CLINICAL DATA:  Dysphagia for 2 days becoming worse. Hypertension and hypercholesterolemia.  EXAM: MRI HEAD WITHOUT CONTRAST  MRA HEAD WITHOUT CONTRAST  TECHNIQUE: Multiplanar, multiecho pulse sequences of the brain and surrounding structures were obtained without intravenous contrast. Angiographic images of the head were obtained using MRA technique without contrast.  COMPARISON:  12/14/2013 CT.  No comparison MR.  FINDINGS: MRI HEAD FINDINGS  Moderate size nonhemorrhagic right mid frontal lobe infarct extending to the operculum region.  Small acute infarct right cerebellum.  Involvement of two vascular distributions raises possibility of embolic disease.  Mild to moderate nonspecific white matter type changes which in the present clinical setting is most consistent with result of small vessel disease.  No intracranial mass lesion noted on this unenhanced exam.  No hydrocephalus.  Prominent peri vascular space right basal ganglia.  Minimal to mild paranasal sinus mucosal thickening.  MRA HEAD FINDINGS  Anterior circulation without medium or large size vessel significant stenosis or occlusion.  Mild to moderate irregularity middle cerebral artery branches greater on the right. Decreased number of visualized right middle cerebral artery branch vessels consistent with patient's right frontal lobe infarct.  Left vertebral artery is dominant. No significant stenosis of the distal vertebral arteries or basilar artery. Basilar artery is mildly ectatic.  Poor delineation of the anterior inferior cerebellar arteries.  Slight irregularity left posterior cerebral artery distal branches.  No aneurysm noted.  IMPRESSION: MRI HEAD:  Moderate size nonhemorrhagic right mid  frontal lobe infarct extending to the operculum region.  Small acute infarct right cerebellum.  Involvement of two vascular distributions raises possibility of embolic disease.  Mild to moderate nonspecific white matter type changes which in the present clinical setting is most consistent with result of small vessel disease.  MRA HEAD:  Anterior circulation without medium or large size vessel significant stenosis or occlusion.  Mild to moderate  irregularity middle cerebral artery branches greater on the right. Decreased number of visualized right middle cerebral artery branch vessels consistent with patient's right frontal lobe infarct.  Left vertebral artery is dominant. No significant stenosis of the distal vertebral arteries or basilar artery. Basilar artery is mildly ectatic.  Poor delineation of the anterior inferior cerebellar arteries.  Slight irregularity left posterior cerebral artery distal branches.   Electronically Signed   By: Bridgett Larsson M.D.   On: 12/14/2013 21:36   Mr Brain Wo Contrast  12/14/2013   CLINICAL DATA:  Dysphagia for 2 days becoming worse. Hypertension and hypercholesterolemia.  EXAM: MRI HEAD WITHOUT CONTRAST  MRA HEAD WITHOUT CONTRAST  TECHNIQUE: Multiplanar, multiecho pulse sequences of the brain and surrounding structures were obtained without intravenous contrast. Angiographic images of the head were obtained using MRA technique without contrast.  COMPARISON:  12/14/2013 CT.  No comparison MR.  FINDINGS: MRI HEAD FINDINGS  Moderate size nonhemorrhagic right mid frontal lobe infarct extending to the operculum region.  Small acute infarct right cerebellum.  Involvement of two vascular distributions raises possibility of embolic disease.  Mild to moderate nonspecific white matter type changes which in the present clinical setting is most consistent with result of small vessel disease.  No intracranial mass lesion noted on this unenhanced exam.  No hydrocephalus.  Prominent peri  vascular space right basal ganglia.  Minimal to mild paranasal sinus mucosal thickening.  MRA HEAD FINDINGS  Anterior circulation without medium or large size vessel significant stenosis or occlusion.  Mild to moderate irregularity middle cerebral artery branches greater on the right. Decreased number of visualized right middle cerebral artery branch vessels consistent with patient's right frontal lobe infarct.  Left vertebral artery is dominant. No significant stenosis of the distal vertebral arteries or basilar artery. Basilar artery is mildly ectatic.  Poor delineation of the anterior inferior cerebellar arteries.  Slight irregularity left posterior cerebral artery distal branches.  No aneurysm noted.  IMPRESSION: MRI HEAD:  Moderate size nonhemorrhagic right mid frontal lobe infarct extending to the operculum region.  Small acute infarct right cerebellum.  Involvement of two vascular distributions raises possibility of embolic disease.  Mild to moderate nonspecific white matter type changes which in the present clinical setting is most consistent with result of small vessel disease.  MRA HEAD:  Anterior circulation without medium or large size vessel significant stenosis or occlusion.  Mild to moderate irregularity middle cerebral artery branches greater on the right. Decreased number of visualized right middle cerebral artery branch vessels consistent with patient's right frontal lobe infarct.  Left vertebral artery is dominant. No significant stenosis of the distal vertebral arteries or basilar artery. Basilar artery is mildly ectatic.  Poor delineation of the anterior inferior cerebellar arteries.  Slight irregularity left posterior cerebral artery distal branches.   Electronically Signed   By: Bridgett Larsson M.D.   On: 12/14/2013 21:36    Microbiology: No results found for this or any previous visit (from the past 240 hour(s)).   Labs: Basic Metabolic Panel:  Recent Labs Lab 12/14/13 1900  12/15/13 0840  NA 139 139  K 4.4 4.8  CL 101 101  CO2 25 27  GLUCOSE 98 119*  BUN 23 17  CREATININE 0.93 0.84  CALCIUM 9.5 9.5   Liver Function Tests:  Recent Labs Lab 12/14/13 1900 12/15/13 0840  AST 26 30  ALT 33 36  ALKPHOS 50 52  BILITOT 0.8 1.1  PROT 7.3 7.3  ALBUMIN 3.9 3.8   No  results found for this basename: LIPASE, AMYLASE,  in the last 168 hours No results found for this basename: AMMONIA,  in the last 168 hours CBC:  Recent Labs Lab 12/14/13 1900 12/15/13 0840  WBC 6.0 4.1  NEUTROABS 3.9 2.2  HGB 12.7* 14.3  HCT 38.2* 42.7  MCV 97.7 99.3  PLT 161 165   Cardiac Enzymes: No results found for this basename: CKTOTAL, CKMB, CKMBINDEX, TROPONINI,  in the last 168 hours BNP: BNP (last 3 results) No results found for this basename: PROBNP,  in the last 8760 hours CBG: No results found for this basename: GLUCAP,  in the last 168 hours     Signed:  Jeralyn Bennett  [facility] 12/15/2013, 6:28 PM

## 2013-12-15 NOTE — Progress Notes (Signed)
[facility] PROGRESS NOTE  Brandon Soto XHB:[id number] DOB: [birth year] DOA: 12/14/2013 PCP: [name], MD  Assessment/Plan: 1. Acute CVA -MRI of brain showing moderate size nonhemorrhagic right mid frontal lobe infarct -Symptoms improved today. -Patient seen and evaluated by neurology, recommending Plavix 75 mg by mouth daily as well as transesophageal echocardiogram to look for embolic source. This was arranged with [facility] for next week by neurology. -EP consult for placement of implantable loop recorder -On statin therapy  2. Dyslipidemia -Continue Lipitor 80 mg by mouth daily  3. Hypertension -Holding antihypertensive agents to favor cerebral perfusion in setting of  Code Status: Full Code Family Communication: Spoke with daughter present at bedside Disposition Plan:    Consultants:  Neurology     HPI/Subjective: Patient is a 68 year old with a past medical history of dyslipidemia, hypertension, coronary artery disease was admitted to the medicine service on 12/15/2013 presenting with difficulty with speech. Patient seen and evaluated by neurology, with MRI of brain showing moderate size nonhemorrhagic right mid frontal lobe infarct extending into the operculum region.   Objective: Filed Vitals:   12/15/13 1358  BP: 114/67  Pulse: 54  Temp: 98.4 F (36.9 C)  Resp: 18   No intake or output data in the 24 hours ending 12/15/13 1635 Filed Weights   12/14/13 1830 12/14/13 2326  Weight: 106.595 kg (235 lb) 105.5 kg (232 lb 9.4 oz)    Exam:   General:  Patient is in no acute distress, awake alert oriented  Cardiovascular: Regular rate rhythm normal S1-S2  Respiratory: Clear to auscultation bilaterally  Abdomen: Soft nontender nondistended  Musculoskeletal: No edema  Neurologic: Improved dysarthria, out of 5 muscle strength, no facial droop  Data Reviewed: Basic Metabolic Panel:  Recent Labs Lab 12/14/13 1900  12/15/13 0840  NA 139 139  K 4.4 4.8  CL 101 101  CO2 25 27  GLUCOSE 98 119*  BUN 23 17  CREATININE 0.93 0.84  CALCIUM 9.5 9.5   Liver Function Tests:  Recent Labs Lab 12/14/13 1900 12/15/13 0840  AST 26 30  ALT 33 36  ALKPHOS 50 52  BILITOT 0.8 1.1  PROT 7.3 7.3  ALBUMIN 3.9 3.8   No results found for this basename: LIPASE, AMYLASE,  in the last 168 hours No results found for this basename: AMMONIA,  in the last 168 hours CBC:  Recent Labs Lab 12/14/13 1900 12/15/13 0840  WBC 6.0 4.1  NEUTROABS 3.9 2.2  HGB 12.7* 14.3  HCT 38.2* 42.7  MCV 97.7 99.3  PLT 161 165   Cardiac Enzymes: No results found for this basename: CKTOTAL, CKMB, CKMBINDEX, TROPONINI,  in the last 168 hours BNP (last 3 results) No results found for this basename: PROBNP,  in the last 8760 hours CBG: No results found for this basename: GLUCAP,  in the last 168 hours  No results found for this or any previous visit (from the past 240 hour(s)).   Studies: Ct Head Wo Contrast  12/14/2013   CLINICAL DATA:  Dysphasia.  EXAM: CT HEAD WITHOUT CONTRAST  TECHNIQUE: Contiguous axial images were obtained from the base of the skull through the vertex without intravenous contrast.  COMPARISON:  None.  FINDINGS: Bony calvarium appears intact. No mass effect or midline shift is noted. Ventricular size is within normal limits. There is no evidence of mass lesion, hemorrhage or acute infarction. Rounded low density is noted in right basal ganglia consistent with old lacunar infarction.  IMPRESSION: Old lacunar  infarction in right basal ganglia. No acute intracranial abnormality seen.   Electronically Signed   By: Roque Lias M.D.   On: 12/14/2013 19:44   Mr Angiogram Head Wo Contrast  12/14/2013   CLINICAL DATA:  Dysphagia for 2 days becoming worse. Hypertension and hypercholesterolemia.  EXAM: MRI HEAD WITHOUT CONTRAST  MRA HEAD WITHOUT CONTRAST  TECHNIQUE: Multiplanar, multiecho pulse sequences of the brain and  surrounding structures were obtained without intravenous contrast. Angiographic images of the head were obtained using MRA technique without contrast.  COMPARISON:  12/14/2013 CT.  No comparison MR.  FINDINGS: MRI HEAD FINDINGS  Moderate size nonhemorrhagic right mid frontal lobe infarct extending to the operculum region.  Small acute infarct right cerebellum.  Involvement of two vascular distributions raises possibility of embolic disease.  Mild to moderate nonspecific white matter type changes which in the present clinical setting is most consistent with result of small vessel disease.  No intracranial mass lesion noted on this unenhanced exam.  No hydrocephalus.  Prominent peri vascular space right basal ganglia.  Minimal to mild paranasal sinus mucosal thickening.  MRA HEAD FINDINGS  Anterior circulation without medium or large size vessel significant stenosis or occlusion.  Mild to moderate irregularity middle cerebral artery branches greater on the right. Decreased number of visualized right middle cerebral artery branch vessels consistent with patient's right frontal lobe infarct.  Left vertebral artery is dominant. No significant stenosis of the distal vertebral arteries or basilar artery. Basilar artery is mildly ectatic.  Poor delineation of the anterior inferior cerebellar arteries.  Slight irregularity left posterior cerebral artery distal branches.  No aneurysm noted.  IMPRESSION: MRI HEAD:  Moderate size nonhemorrhagic right mid frontal lobe infarct extending to the operculum region.  Small acute infarct right cerebellum.  Involvement of two vascular distributions raises possibility of embolic disease.  Mild to moderate nonspecific white matter type changes which in the present clinical setting is most consistent with result of small vessel disease.  MRA HEAD:  Anterior circulation without medium or large size vessel significant stenosis or occlusion.  Mild to moderate irregularity middle cerebral  artery branches greater on the right. Decreased number of visualized right middle cerebral artery branch vessels consistent with patient's right frontal lobe infarct.  Left vertebral artery is dominant. No significant stenosis of the distal vertebral arteries or basilar artery. Basilar artery is mildly ectatic.  Poor delineation of the anterior inferior cerebellar arteries.  Slight irregularity left posterior cerebral artery distal branches.   Electronically Signed   By: Bridgett Larsson M.D.   On: 12/14/2013 21:36   Mr Brain Wo Contrast  12/14/2013   CLINICAL DATA:  Dysphagia for 2 days becoming worse. Hypertension and hypercholesterolemia.  EXAM: MRI HEAD WITHOUT CONTRAST  MRA HEAD WITHOUT CONTRAST  TECHNIQUE: Multiplanar, multiecho pulse sequences of the brain and surrounding structures were obtained without intravenous contrast. Angiographic images of the head were obtained using MRA technique without contrast.  COMPARISON:  12/14/2013 CT.  No comparison MR.  FINDINGS: MRI HEAD FINDINGS  Moderate size nonhemorrhagic right mid frontal lobe infarct extending to the operculum region.  Small acute infarct right cerebellum.  Involvement of two vascular distributions raises possibility of embolic disease.  Mild to moderate nonspecific white matter type changes which in the present clinical setting is most consistent with result of small vessel disease.  No intracranial mass lesion noted on this unenhanced exam.  No hydrocephalus.  Prominent peri vascular space right basal ganglia.  Minimal to mild paranasal sinus mucosal  thickening.  MRA HEAD FINDINGS  Anterior circulation without medium or large size vessel significant stenosis or occlusion.  Mild to moderate irregularity middle cerebral artery branches greater on the right. Decreased number of visualized right middle cerebral artery branch vessels consistent with patient's right frontal lobe infarct.  Left vertebral artery is dominant. No significant stenosis of the  distal vertebral arteries or basilar artery. Basilar artery is mildly ectatic.  Poor delineation of the anterior inferior cerebellar arteries.  Slight irregularity left posterior cerebral artery distal branches.  No aneurysm noted.  IMPRESSION: MRI HEAD:  Moderate size nonhemorrhagic right mid frontal lobe infarct extending to the operculum region.  Small acute infarct right cerebellum.  Involvement of two vascular distributions raises possibility of embolic disease.  Mild to moderate nonspecific white matter type changes which in the present clinical setting is most consistent with result of small vessel disease.  MRA HEAD:  Anterior circulation without medium or large size vessel significant stenosis or occlusion.  Mild to moderate irregularity middle cerebral artery branches greater on the right. Decreased number of visualized right middle cerebral artery branch vessels consistent with patient's right frontal lobe infarct.  Left vertebral artery is dominant. No significant stenosis of the distal vertebral arteries or basilar artery. Basilar artery is mildly ectatic.  Poor delineation of the anterior inferior cerebellar arteries.  Slight irregularity left posterior cerebral artery distal branches.   Electronically Signed   By: Bridgett Larsson M.D.   On: 12/14/2013 21:36    Scheduled Meds: . atorvastatin  80 mg Oral q1800  . clopidogrel  75 mg Oral Q breakfast  . enoxaparin (LOVENOX) injection  40 mg Subcutaneous Q24H  . levothyroxine  300 mcg Oral QAC breakfast  . pantoprazole  40 mg Oral Daily   Continuous Infusions:   Principal Problem:   Acute CVA (cerebrovascular accident) Active Problems:   Thyroid disease   Hypertension   Hypercholesteremia    Time spent: 25 min    Jeralyn Bennett  [facility] Pager [phone]. If 7PM-7AM, please contact night-coverage at [email or web address], password [facility] 12/15/2013, 4:35 PM  LOS: 1 day

## 2013-12-15 NOTE — ED Provider Notes (Signed)
Medical screening examination/treatment/procedure(s) were conducted as a shared visit with non-physician practitioner(s) and myself.  I personally evaluated the patient during the encounter.  Wording finding difficulty and expressive aphasia x 2days. No focal weakness, numbness, tingling.   Not a tPA candidate secondary to delay in presentation.  CN 2-12 intact, no ataxia on finger to nose, no nystagmus, 5/5 strength throughout, no pronator drift, Romberg negative, normal gait.   EKG Interpretation None       Glynn Octave, MD 12/15/13 1102

## 2013-12-21 ENCOUNTER — Ambulatory Visit (INDEPENDENT_AMBULATORY_CARE_PROVIDER_SITE_OTHER): Payer: Medicare Other | Admitting: Cardiovascular Disease

## 2013-12-21 ENCOUNTER — Encounter: Payer: Self-pay | Admitting: Cardiovascular Disease

## 2013-12-21 ENCOUNTER — Encounter: Payer: Self-pay | Admitting: Surgery

## 2013-12-21 VITALS — BP 128/62 | HR 54 | Ht 73.0 in | Wt 237.3 lb

## 2013-12-21 DIAGNOSIS — R5381 Other malaise: Secondary | ICD-10-CM

## 2013-12-21 DIAGNOSIS — I6350 Cerebral infarction due to unspecified occlusion or stenosis of unspecified cerebral artery: Secondary | ICD-10-CM

## 2013-12-21 DIAGNOSIS — I10 Essential (primary) hypertension: Secondary | ICD-10-CM

## 2013-12-21 DIAGNOSIS — R5383 Other fatigue: Secondary | ICD-10-CM

## 2013-12-21 DIAGNOSIS — Z01818 Encounter for other preprocedural examination: Secondary | ICD-10-CM

## 2013-12-21 DIAGNOSIS — D689 Coagulation defect, unspecified: Secondary | ICD-10-CM

## 2013-12-21 DIAGNOSIS — I639 Cerebral infarction, unspecified: Secondary | ICD-10-CM

## 2013-12-21 DIAGNOSIS — I219 Acute myocardial infarction, unspecified: Secondary | ICD-10-CM

## 2013-12-21 DIAGNOSIS — Z79899 Other long term (current) drug therapy: Secondary | ICD-10-CM

## 2013-12-21 MED ORDER — ATENOLOL 25 MG PO TABS: 12.5000 mg | ORAL_TABLET | Freq: Every day | ORAL | Status: DC

## 2013-12-21 MED ORDER — CLOPIDOGREL BISULFATE 75 MG PO TABS: 75.0000 mg | ORAL_TABLET | Freq: Every day | ORAL | Status: DC

## 2013-12-21 MED ORDER — OMEPRAZOLE 40 MG PO CPDR: 40.0000 mg | DELAYED_RELEASE_CAPSULE | Freq: Two times a day (BID) | ORAL | Status: DC

## 2013-12-21 MED ORDER — ROSUVASTATIN CALCIUM 20 MG PO TABS: 20.0000 mg | ORAL_TABLET | Freq: Every day | ORAL | Status: DC

## 2013-12-21 NOTE — Assessment & Plan Note (Signed)
Well-controlled on current medications 

## 2013-12-21 NOTE — Assessment & Plan Note (Addendum)
Recent stroke with neurologic bleeding manifested by expressive aphasia. He had carotid Dopplers that were essentially normal. A transthoracic echo did not show an embolic source. Dr. Johney Frame placed a loop ordered to rule out paroxysmal atrial fibrillation as a cause to his cryptogenic stroke. The plan was to await transesophageal echo. He is on Plavix.

## 2013-12-21 NOTE — Assessment & Plan Note (Signed)
History of CAD status post LAD intervention by Dr. Jorje Guild  in 2001 in the setting of acute myocardial infarction. I catheterized him in 2004 revealing a patent stent with an anteroapical wall motion abnormality , EF of 45-50%. He denies chest pain or shortness of breath. Myoview performed October 2011 showed apical scar without ischemia with a normal EF.

## 2013-12-21 NOTE — Assessment & Plan Note (Signed)
On statin therapy with recent lipid profile performed 12/15/13 revealing total cholesterol 109 and LDL 43 and HDL of 41

## 2013-12-21 NOTE — Patient Instructions (Signed)
Your physician has requested that you have a TEE. During a TEE, sound waves are used to create images of your heart. It provides your doctor with information about the size and shape of your heart and how well your heart's chambers and valves are working. In this test, a transducer is attached to the end of a flexible tube that's guided down your throat and into your esophagus (the tube leading from you mouth to your stomach) to get a more detailed image of your heart. You are not awake for the procedure. Please see the instruction sheet given to you today. For further information please visit [email or web address]  You will have to have some lab work done prior to your TEE. The scheduler will let you know when to have this done.  Dr Allyson Sabal wants you to follow-up in 1 year. You will receive a reminder letter in the mail one months in advance. If you don't receive a letter, please call our office to schedule the follow-up appointment.

## 2013-12-21 NOTE — Progress Notes (Signed)
12/21/2013 Brandon Soto   10/31/1945  [id number]  Primary Physician Brandon Balls, MD Primary Cardiologist: Brandon Gess MD Brandon Soto   HPI:  The patient is a very pleasant 68 year old, mildly overweight, married, Caucasian male father of 2, grandfather to 3 grandchildren who I last saw in the office 6 months ago. He has a history of CAD status post LAD intervention by Dr. Jorje Soto in 2001 in the setting of a myocardial infarction. I catheterization him in 2004 revealing a patent stent with an anteroapical wall motion abnormality and EF of 45-50%. His other problems include hypertension, hyperlipidemia, and hypothyroidism. He denies chest pain or shortness of breath. His last Myoview performed in October of 2011 showed apical scar, and echo showed a normal EF. His most recent lab work revealed a total cholesterol of 146, LDL of 72, and HDL 49.he had a left total knee replacement performed by Dr. Hadassah Soto 06/27/12 which was uncomplicated. He denies chest pain or shortness of breath. His primary care physician, Brandon Soto, follows his lipid profile closely.since I saw him a year ago he has had a recent stroke apparently in 2 vascular territories. Workup has been unrevealing including carotid Dopplers which were essentially normal and a transthoracic echo that did not show an embolic source. Brandon Soto placed a loop recorder to rule out paroxysmal atrial fibrillation as a cause. He was placed on Plavix.    Current Outpatient Prescriptions  Medication Sig Dispense Refill  . acetaminophen (TYLENOL) 325 MG tablet Take 650 mg by mouth every 6 (six) hours as needed for mild pain. For pain in hands      . atenolol (TENORMIN) 25 MG tablet Take 0.5 tablets (12.5 mg total) by mouth daily.  90 tablet  3  . clopidogrel (PLAVIX) 75 MG tablet Take 1 tablet (75 mg total) by mouth daily with breakfast.  90 tablet  1  . levothyroxine (SYNTHROID, LEVOTHROID) 300 MCG tablet Take 300 mcg by  mouth daily before breakfast.      . meloxicam (MOBIC) 15 MG tablet Take 15 mg by mouth daily.      . Multiple Vitamin (MULTIVITAMIN WITH MINERALS) TABS Take 1 tablet by mouth daily.      [name] omega-3 acid ethyl esters (LOVAZA) 1 G capsule Take 2 g by mouth daily. Taking the [facility] brand      . omeprazole (PRILOSEC) 40 MG capsule Take 40 mg by mouth 2 (two) times daily.      . rosuvastatin (CRESTOR) 20 MG tablet Take 1 tablet (20 mg total) by mouth at bedtime.  90 tablet  3  . CIALIS 5 MG tablet Take 1 tablet by mouth as needed.       No current facility-administered medications for this visit.    Allergies  Allergen Reactions  . Penicillins     Pt unsure of reaction    History   Social History  . Marital Status: Married    Spouse Name: N/A    Number of Children: N/A  . Years of Education: N/A   Occupational History  . Not on file.   Social History Main Topics  . Smoking status: Never Smoker   . Smokeless tobacco: Never Used  . Alcohol Use: No  . Drug Use: No  . Sexual Activity: Yes   Other Topics Concern  . Not on file   Social History Narrative  . No narrative on file     Review of Systems: General: negative for  chills, fever, night sweats or weight changes.  Cardiovascular: negative for chest pain, dyspnea on exertion, edema, orthopnea, palpitations, paroxysmal nocturnal dyspnea or shortness of breath Dermatological: negative for rash Respiratory: negative for cough or wheezing Urologic: negative for hematuria Abdominal: negative for nausea, vomiting, diarrhea, bright red blood per rectum, melena, or hematemesis Neurologic: negative for visual changes, syncope, or dizziness All other systems reviewed and are otherwise negative except as noted above.    Blood pressure 128/62, pulse 54, height 6\' 1"  (1.854 m), weight 237 lb 4.8 oz (107.639 kg).  General appearance: alert and no distress Neck: no adenopathy, no carotid bruit, no JVD, supple, symmetrical, trachea  midline and thyroid not enlarged, symmetric, no tenderness/mass/nodules Lungs: clear to auscultation bilaterally Heart: regular rate and rhythm, S1, S2 normal, no murmur, click, rub or gallop Extremities: extremities normal, atraumatic, no cyanosis or edema  EKG sinus bradycardia at 54 with no ST or T wave changes  ASSESSMENT AND PLAN:   Hypertension Well-controlled on current medications  Hypercholesteremia On statin therapy with recent lipid profile performed 12/15/13 revealing total cholesterol 109 and LDL 43 and HDL of 41  Acute CVA (cerebrovascular accident) Recent stroke with neurologic bleeding manifested by expressive aphasia. He had carotid Dopplers that were essentially normal. A transthoracic echo did not show an embolic source. Dr. 12/17/13 placed a loop ordered to rule out paroxysmal atrial fibrillation as a cause to his cryptogenic stroke. The plan was to await transesophageal echo. He is on Plavix.  Heart attack History of CAD status post LAD intervention by Dr. Johney Soto  in 2001 in the setting of acute myocardial infarction. I catheterized him in 2004 revealing a patent stent with an anteroapical wall motion abnormality , EF of 45-50%. He denies chest pain or shortness of breath. Myoview performed October 2011 showed apical scar without ischemia with a normal EF.      November 2011 MD FACP,FACC,FAHA, [facility] 12/21/2013 3:21 PM

## 2013-12-22 ENCOUNTER — Other Ambulatory Visit: Payer: Self-pay | Admitting: *Deleted

## 2013-12-22 DIAGNOSIS — Z01818 Encounter for other preprocedural examination: Secondary | ICD-10-CM

## 2013-12-27 DIAGNOSIS — E039 Hypothyroidism, unspecified: Secondary | ICD-10-CM | POA: Insufficient documentation

## 2013-12-27 DIAGNOSIS — I10 Essential (primary) hypertension: Secondary | ICD-10-CM | POA: Insufficient documentation

## 2013-12-28 ENCOUNTER — Encounter: Payer: Self-pay | Admitting: Internal Medicine

## 2013-12-28 ENCOUNTER — Ambulatory Visit (INDEPENDENT_AMBULATORY_CARE_PROVIDER_SITE_OTHER): Payer: Medicare Other | Admitting: *Deleted

## 2013-12-28 DIAGNOSIS — I6350 Cerebral infarction due to unspecified occlusion or stenosis of unspecified cerebral artery: Secondary | ICD-10-CM

## 2013-12-28 DIAGNOSIS — I639 Cerebral infarction, unspecified: Secondary | ICD-10-CM

## 2013-12-28 LAB — MDC_IDC_ENUM_SESS_TYPE_INCLINIC
Date Time Interrogation Session: 20150604123735
Zone Setting Detection Interval: 2000 ms
Zone Setting Detection Interval: 3000 ms
Zone Setting Detection Interval: 370 ms

## 2013-12-28 NOTE — Progress Notes (Addendum)
Wound check in clinic s/p linq implant. Wound well healed without redness or edema. Pt with 0 tachy episodes; 1 brady episode----on 5-26 x 10 sec @ 34 bpm---?Mobitz 1 & 2 + Tenormin 12.5 QD; 0 asystole; 0 AF episodes.  Plan to follow up QMO and with JA PRN (CVA pt).

## 2013-12-29 ENCOUNTER — Ambulatory Visit: Payer: Medicare Other | Admitting: Cardiovascular Disease

## 2014-01-03 ENCOUNTER — Encounter (HOSPITAL_COMMUNITY): Payer: Self-pay

## 2014-01-03 ENCOUNTER — Encounter (HOSPITAL_COMMUNITY)
Admission: RE | Disposition: A | Discharge status: Home / Self Care | Payer: Self-pay | Source: Ambulatory Visit | Attending: Cardiovascular Disease

## 2014-01-03 ENCOUNTER — Telehealth: Payer: Self-pay | Admitting: Cardiovascular Disease

## 2014-01-03 ENCOUNTER — Ambulatory Visit (HOSPITAL_COMMUNITY)
Admission: RE | Admit: 2014-01-03 | Discharge: 2014-01-03 | Disposition: A | Discharge status: Home / Self Care | Payer: Medicare Other | Source: Ambulatory Visit | Attending: Cardiovascular Disease | Admitting: Cardiovascular Disease

## 2014-01-03 DIAGNOSIS — M069 Rheumatoid arthritis, unspecified: Secondary | ICD-10-CM | POA: Diagnosis not present

## 2014-01-03 DIAGNOSIS — I6350 Cerebral infarction due to unspecified occlusion or stenosis of unspecified cerebral artery: Secondary | ICD-10-CM | POA: Insufficient documentation

## 2014-01-03 DIAGNOSIS — Z96659 Presence of unspecified artificial knee joint: Secondary | ICD-10-CM | POA: Insufficient documentation

## 2014-01-03 DIAGNOSIS — Z79899 Other long term (current) drug therapy: Secondary | ICD-10-CM | POA: Diagnosis not present

## 2014-01-03 DIAGNOSIS — K219 Gastro-esophageal reflux disease without esophagitis: Secondary | ICD-10-CM | POA: Diagnosis not present

## 2014-01-03 DIAGNOSIS — I2510 Atherosclerotic heart disease of native coronary artery without angina pectoris: Secondary | ICD-10-CM | POA: Insufficient documentation

## 2014-01-03 DIAGNOSIS — Z01818 Encounter for other preprocedural examination: Secondary | ICD-10-CM

## 2014-01-03 DIAGNOSIS — E7800 Pure hypercholesterolemia, unspecified: Secondary | ICD-10-CM | POA: Diagnosis not present

## 2014-01-03 DIAGNOSIS — Z9861 Coronary angioplasty status: Secondary | ICD-10-CM | POA: Diagnosis not present

## 2014-01-03 DIAGNOSIS — M1710 Unilateral primary osteoarthritis, unspecified knee: Secondary | ICD-10-CM | POA: Insufficient documentation

## 2014-01-03 DIAGNOSIS — E039 Hypothyroidism, unspecified: Secondary | ICD-10-CM | POA: Diagnosis not present

## 2014-01-03 DIAGNOSIS — I10 Essential (primary) hypertension: Secondary | ICD-10-CM | POA: Diagnosis not present

## 2014-01-03 DIAGNOSIS — I639 Cerebral infarction, unspecified: Secondary | ICD-10-CM

## 2014-01-03 DIAGNOSIS — Z7982 Long term (current) use of aspirin: Secondary | ICD-10-CM | POA: Insufficient documentation

## 2014-01-03 HISTORY — PX: TEE WITHOUT CARDIOVERSION: SHX5443

## 2014-01-03 SURGERY — ECHOCARDIOGRAM, TRANSESOPHAGEAL
Anesthesia: Moderate Sedation

## 2014-01-03 MED ORDER — FENTANYL CITRATE 0.05 MG/ML IJ SOLN
INTRAMUSCULAR | Status: DC | PRN
  Administered 2014-01-03: 50 ug via INTRAVENOUS

## 2014-01-03 MED ORDER — BUTAMBEN-TETRACAINE-BENZOCAINE 2-2-14 % EX AERO
INHALATION_SPRAY | CUTANEOUS | Status: DC | PRN
  Administered 2014-01-03: 2 via TOPICAL

## 2014-01-03 MED ORDER — FENTANYL CITRATE 0.05 MG/ML IJ SOLN
INTRAMUSCULAR | Status: AC
  Filled 2014-01-03: qty 2

## 2014-01-03 MED ORDER — MIDAZOLAM HCL 10 MG/2ML IJ SOLN
INTRAMUSCULAR | Status: DC | PRN
  Administered 2014-01-03 (×2): 2 mg via INTRAVENOUS

## 2014-01-03 MED ORDER — MIDAZOLAM HCL 5 MG/ML IJ SOLN
INTRAMUSCULAR | Status: AC
  Filled 2014-01-03: qty 2

## 2014-01-03 MED ORDER — PANTOPRAZOLE SODIUM 40 MG PO TBEC: 40.0000 mg | DELAYED_RELEASE_TABLET | Freq: Every day | ORAL | Status: DC

## 2014-01-03 MED ORDER — SODIUM CHLORIDE 0.9 % IV SOLN: INTRAVENOUS | Status: DC

## 2014-01-03 NOTE — Discharge Instructions (Signed)
Transesophageal Echocardiography °Transesophageal echocardiography (TEE) is a picture test of your heart using sound waves. The pictures taken can give very detailed pictures of your heart. This can help your doctor see if there are problems with your heart. TEE can check: °· If your heart has blood clots in it. °· How well your heart valves are working. °· If you have an infection on the inside of your heart. °· Some of the major arteries of your heart. °· If your heart valve is working after a repair. °· Your heart before a procedure that uses a shock to your heart to get the rhythm back to normal. °BEFORE THE PROCEDURE °· Do not eat or drink for 6 hours before the procedure or as told by your doctor. °· Make plans to have someone drive you home after the procedure. Do not drive yourself home. °· An IV tube will be put in your arm. °PROCEDURE °· You will be given a medicine to help you relax (sedative). It will be given through the IV tube. °· A numbing medicine will be sprayed in the back of your throat to help numb it. °· The tip of the probe is placed into the back of your mouth. You will be asked to swallow. This helps to pass the probe into your esophagus. °· Once the tip of the probe is in the right place, your doctor can take pictures of your heart. °· You may feel pressure at the back of your throat. °AFTER THE PROCEDURE °· You will be taken to a recovery area so the sedative can wear off. °· Your throat may be sore and scratchy. This will go away slowly over time. °· You will go home when you are fully awake and able to swallow liquids. °· You should have someone stay with you for the next 24 hours. °Document Released: 05/10/2009 Document Revised: 05/03/2013 Document Reviewed: 01/12/2013 °Exit[employer]® Patient Information ©2014 [employer]. °Conscious Sedation, Adult, Care After °Refer to this sheet in the next few weeks. These instructions provide you with information on caring for yourself after your  procedure. Your health care provider may also give you more specific instructions. Your treatment has been planned according to current medical practices, but problems sometimes occur. Call your health care provider if you have any problems or questions after your procedure. °WHAT TO EXPECT AFTER THE PROCEDURE  °After your procedure: °· You may feel sleepy, clumsy, and have poor balance for several hours. °· Vomiting may occur if you eat too soon after the procedure. °HOME CARE INSTRUCTIONS °· Do not participate in any activities where you could become injured for at least 24 hours. Do not: °· Drive. °· Swim. °· Ride a bicycle. °· Operate heavy machinery. °· Cook. °· Use power tools. °· Climb ladders. °· Work from a high place. °· Do not make important decisions or sign legal documents until you are improved. °· If you vomit, drink water, juice, or soup when you can drink without vomiting. Make sure you have little or no nausea before eating solid foods. °· Only take over-the-counter or prescription medicines for pain, discomfort, or fever as directed by your health care provider. °· Make sure you and your family fully understand everything about the medicines given to you, including what side effects may occur. °· You should not drink alcohol, take sleeping pills, or take medicines that cause drowsiness for at least 24 hours. °· If you smoke, do not smoke without supervision. °· If you are feeling   better, you may resume normal activities 24 hours after you were sedated. °· Keep all appointments with your health care provider. °SEEK MEDICAL CARE IF: °· Your skin is pale or bluish in color. °· You continue to feel nauseous or vomit. °· Your pain is getting worse and is not helped by medicine. °· You have bleeding or swelling. °· You are still sleepy or feeling clumsy after 24 hours. °SEEK IMMEDIATE MEDICAL CARE IF: °· You develop a rash. °· You have difficulty breathing. °· You develop any type of allergic  problem. °· You have a fever. °MAKE SURE YOU: °· Understand these instructions. °· Will watch your condition. °· Will get help right away if you are not doing well or get worse. °Document Released: 05/03/2013 Document Reviewed: 02/17/2013 °ExitCare® Patient Information ©2014 [employer]. ° °

## 2014-01-03 NOTE — Telephone Encounter (Signed)
Spoke with Arline Asp _PharmD at mail order pharmacy, switched omeprazole to pantoprazole due to interaction with clopidogrel

## 2014-01-03 NOTE — Telephone Encounter (Signed)
XNT#[id number] Need clarification on a drug interaction between Prilosec and Plavix.

## 2014-01-03 NOTE — Interval H&P Note (Signed)
History and Physical Interval Note:  01/03/2014 8:03 AM  Brandon Soto  has presented today for surgery, with the diagnosis of STROKE  The various methods of treatment have been discussed with the patient and family. After consideration of risks, benefits and other options for treatment, the patient has consented to  Procedure(s): TRANSESOPHAGEAL ECHOCARDIOGRAM (TEE) (N/A) as a surgical intervention .  The patient's history has been reviewed, patient examined, no change in status, stable for surgery.  I have reviewed the patient's chart and labs.  Questions were answered to the patient's satisfaction.     [name]

## 2014-01-03 NOTE — Progress Notes (Signed)
Transesophageal Echocardiogram Complete.  01/03/2014   Lakeisha Waldrop, RDCS 

## 2014-01-03 NOTE — H&P (View-Only) (Signed)
ELECTROPHYSIOLOGY CONSULT NOTE  Patient ID: Brandon Soto MRN: [id number], DOB/AGE: June 29, 1946   Admit date: 12/14/2013 Date of Consult: 12/15/2013  Primary Physician: [name], MD Primary Cardiologist: Allyson Sabal Reason for Consultation: Cryptogenic stroke; recommendations regarding Implantable Loop Recorder  History of Present Illness Brandon Soto was admitted on 12/14/2013 with expressive aphasia.  Imaging demonstrated a moderate right mid frontal lobe infarct as well as a right cerebellar infarct.  He has undergone workup for stroke including echocardiogram and carotid dopplers.  The patient has been monitored on telemetry which has demonstrated sinus rhythm with no arrhythmias.    Past medical history includes coronary artery disease s/p LAD intervention in 2001 (last cath 2004 demonstrated patent stent), hypertension, hypothyroidism.    Echocardiogram this admission demonstrated EF 50-55%, hypokinesis of apical anteroseptal myocardium, grade 1 diastolic dysfunction, LA moderately dilated (46).  Lab work is remarkable for TSH 0.144, FT4 2.21.  Prior to admission, the patient denies chest pain, shortness of breath, dizziness, palpitations, or syncope.  They are recovering from their stroke with plans to return home at discharge.  EP has been asked to evaluate for placement of an implantable loop recorder to monitor for atrial fibrillation.  ROS is negative except as outlined above.    Past Medical History  Diagnosis Date  . Hypertension   . Hypercholesteremia   . H/O dizziness   . BPH (benign prostatic hyperplasia)   . PONV (postoperative nausea and vomiting)   . Coronary artery disease   . Heart attack 2001  . Hypothyroidism     "shrank w/radiation ?1990's" (06/27/2012)  . GERD (gastroesophageal reflux disease)   . Left knee DJD   . Rheumatoid arthritis(714.0)     "all over; hands especially" (06/27/2012)  . Dysphagia      Surgical History:  Past Surgical History    Procedure Laterality Date  . Coronary angioplasty with stent placement  2001    "1" (06/27/2012)  . Cardiac catheterization  ~ 2009  . Shoulder arthroscopy with rotator cuff repair and subacromial decompression  2005    "left" (06/27/2012)  . Coronary angioplasty    . Replacement unicondylar joint knee  06/27/2012    "left" (06/27/2012)  . Cholecystectomy  ~ 2006  . Joint replacement    . Partial knee arthroplasty  06/27/2012    Procedure: UNICOMPARTMENTAL KNEE;  Surgeon: Nilda Simmer, MD;  Location: [facility] OR;  Service: Orthopedics;  Laterality: Left;  left unicompartmental knee     Prescriptions prior to admission  Medication Sig Dispense Refill  . acetaminophen (TYLENOL) 325 MG tablet Take 650 mg by mouth every 6 (six) hours as needed for mild pain. For pain in hands      . aspirin EC 81 MG tablet Take 81 mg by mouth every morning.      [name] atenolol (TENORMIN) 25 MG tablet Take 0.5 tablets (12.5 mg total) by mouth daily.  90 tablet  3  . levothyroxine (SYNTHROID, LEVOTHROID) 300 MCG tablet Take 300 mcg by mouth daily before breakfast.      . meloxicam (MOBIC) 15 MG tablet Take 15 mg by mouth daily.      . Multiple Vitamin (MULTIVITAMIN WITH MINERALS) TABS Take 1 tablet by mouth daily.      [name] omega-3 acid ethyl esters (LOVAZA) 1 G capsule Take 2 g by mouth daily. Taking the [facility] brand      . omeprazole (PRILOSEC) 40 MG capsule Take 40 mg by mouth 2 (two) times daily.      [name]  rosuvastatin (CRESTOR) 20 MG tablet Take 1 tablet (20 mg total) by mouth at bedtime.  90 tablet  3  . tadalafil (CIALIS) 10 MG tablet Take 1 tablet (10 mg total) by mouth daily as needed for erectile dysfunction.  10 tablet  3  . [DISCONTINUED] amoxicillin (AMOXIL) 500 MG capsule Take 500 mg by mouth See admin instructions. Take 2 capsules 1 hour prior to dental appt and 2 capsules 1 hour post dental work      . [DISCONTINUED] losartan (COZAAR) 50 MG tablet Take 50 mg by mouth every morning.        Inpatient Medications:   . atorvastatin  80 mg Oral q1800  . clopidogrel  75 mg Oral Q breakfast  . enoxaparin (LOVENOX) injection  40 mg Subcutaneous Q24H  . levothyroxine  300 mcg Oral QAC breakfast  . pantoprazole  40 mg Oral Daily    Allergies:  Allergies  Allergen Reactions  . Penicillins     Pt unsure of reaction    History   Social History  . Marital Status: Married    Spouse Name: N/A    Number of Children: N/A  . Years of Education: N/A   Occupational History  . Not on file.   Social History Main Topics  . Smoking status: Never Smoker   . Smokeless tobacco: Never Used  . Alcohol Use: No  . Drug Use: No  . Sexual Activity: Yes   Other Topics Concern  . Not on file   Social History Narrative  . No narrative on file     Family History  Problem Relation Age of Onset  . Heart attack Mother   . Heart disease Brother   . Cancer Brother   . Cancer - Lung Brother     BP 114/67  Pulse 54  Temp(Src) 98.4 F (36.9 C) (Oral)  Resp 18  Ht 6\' 1"  (1.854 m)  Wt 232 lb 9.4 oz (105.5 kg)  BMI 30.69 kg/m2  SpO2 94%  Physical Exam: Filed Vitals:   12/15/13 0400 12/15/13 0601 12/15/13 0949 12/15/13 1358  BP: 116/63 122/58 137/80 114/67  Pulse: 57 54 55 54  Temp: 97.6 F (36.4 C) 97.5 F (36.4 C) 97.8 F (36.6 C) 98.4 F (36.9 C)  TempSrc: Oral Oral Oral Oral  Resp: 18  18 18   Height:      Weight:      SpO2: 98% 100% 96% 94%    GEN- The patient is well appearing, alert and oriented x 3 today.   Head- normocephalic, atraumatic Eyes-  Sclera clear, conjunctiva pink Ears- hearing intact Oropharynx- clear Neck- supple, no JVP Lymph- no cervical lymphadenopathy Lungs- Clear to ausculation bilaterally, normal work of breathing Heart- Regular rate and rhythm, no murmurs, rubs or gallops, PMI not laterally displaced GI- soft, NT, ND, + BS Extremities- no clubbing, cyanosis, or edema MS- no significant deformity or atrophy Skin- no rash or lesion Psych- euthymic mood, full  affect Neuro- strength and sensation are intact  Labs:   Lab Results  Component Value Date   WBC 4.1 12/15/2013   HGB 14.3 12/15/2013   HCT 42.7 12/15/2013   MCV 99.3 12/15/2013   PLT 165 12/15/2013    Recent Labs Lab 12/15/13 0840  NA 139  K 4.8  CL 101  CO2 27  BUN 17  CREATININE 0.84  CALCIUM 9.5  PROT 7.3  BILITOT 1.1  ALKPHOS 52  ALT 36  AST 30  GLUCOSE 119*  Radiology/Studies: Ct Head Wo Contrast 12/14/2013   CLINICAL DATA:  Dysphasia.  EXAM: CT HEAD WITHOUT CONTRAST  TECHNIQUE: Contiguous axial images were obtained from the base of the skull through the vertex without intravenous contrast.  COMPARISON:  None.  FINDINGS: Bony calvarium appears intact. No mass effect or midline shift is noted. Ventricular size is within normal limits. There is no evidence of mass lesion, hemorrhage or acute infarction. Rounded low density is noted in right basal ganglia consistent with old lacunar infarction.  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 No acute intracranial abnormality seen.   Electronically Signed   By: Roque Lias M.D.   On: 12/14/2013 19:44   Mr Angiogram Head Wo Contrast 12/14/2013   CLINICAL DATA:  Dysphagia for 2 days becoming worse. Hypertension and hypercholesterolemia.  EXAM: MRI HEAD WITHOUT CONTRAST  MRA HEAD WITHOUT CONTRAST  TECHNIQUE: Multiplanar, multiecho pulse sequences of the brain and surrounding structures were obtained without intravenous contrast. Angiographic images of the head were obtained using MRA technique without contrast.  COMPARISON:  12/14/2013 CT.  No comparison MR.  FINDINGS: MRI HEAD FINDINGS  Moderate size nonhemorrhagic right mid frontal lobe infarct extending to the operculum region.  Small acute infarct right cerebellum.  Involvement of two vascular distributions raises possibility of embolic disease.  Mild to moderate nonspecific white matter type changes which in the present clinical setting is most consistent with result of  small vessel disease.  No intracranial mass lesion noted on this unenhanced exam.  No hydrocephalus.  Prominent peri vascular space right basal ganglia.  Minimal to mild paranasal sinus mucosal thickening.  MRA HEAD FINDINGS  Anterior circulation without medium or large size vessel significant stenosis or occlusion.  Mild to moderate irregularity middle cerebral artery branches greater on the right. Decreased number of visualized right middle cerebral artery branch vessels consistent with patient's right frontal lobe infarct.  Left vertebral artery is dominant. No significant stenosis of the distal vertebral arteries or basilar artery. Basilar artery is mildly ectatic.  Poor delineation of the anterior inferior cerebellar arteries.  Slight irregularity left posterior cerebral artery distal branches.  No aneurysm noted.  IMPRESSION: MRI HEAD:  Moderate size nonhemorrhagic right mid frontal lobe infarct extending to the operculum region.  Small acute infarct right cerebellum.  Involvement of two vascular distributions raises possibility of embolic disease.  Mild to moderate nonspecific white matter type changes which in the present clinical setting is most consistent with result of small vessel disease.  MRA HEAD:  Anterior circulation without medium or large size vessel significant stenosis or occlusion.  Mild to moderate irregularity middle cerebral artery branches greater on the right. Decreased number of visualized right middle cerebral artery branch vessels consistent with patient's right frontal lobe infarct.  Left vertebral artery is dominant. No significant stenosis of the distal vertebral arteries or basilar artery. Basilar artery is mildly ectatic.  Poor delineation of the anterior inferior cerebellar arteries.  Slight irregularity left posterior cerebral artery distal branches.   Electronically Signed   By: Bridgett Larsson M.D.   On: 12/14/2013 21:36    12-lead ECG sinus rhythm, rate 59, PR 259, otherwise  normal intervals  Telemetry sinus rhythm with occasional blocked PAC's  Assessment and Plan:  1. Cryptogenic stroke The patient presents with cryptogenic stroke.   I spoke at length with the patient about monitoring for afib with either a 30 day event monitor or an implantable loop recorder.  Risks, benefits, and alteratives to implantable loop recorder  were discussed with the patient today.   At this time, the patient is very clear in their decision to proceed with implantable loop recorder.   Please call with questions.  Hillis Range MD

## 2014-01-03 NOTE — Op Note (Signed)
INDICATIONS: stroke  PROCEDURE:   Informed consent was obtained prior to the procedure. The risks, benefits and alternatives for the procedure were discussed and the patient comprehended these risks.  Risks include, but are not limited to, cough, sore throat, vomiting, nausea, somnolence, esophageal and stomach trauma or perforation, bleeding, low blood pressure, aspiration, pneumonia, infection, trauma to the teeth and death.    After a procedural time-out, the oropharynx was anesthetized with 20% benzocaine spray. The patient was given 4 mg versed and 50 mcg fentanyl for moderate sedation.   The transesophageal probe was inserted in the esophagus and stomach without difficulty and multiple views were obtained.  The patient was kept under observation until the patient left the procedure room.  The patient left the procedure room in stable condition.   Agitated microbubble saline contrast was administered.  COMPLICATIONS:    There were no immediate complications.  FINDINGS:  Normal except mildly elevated sPAP  RECOMMENDATIONS:   No cardiac or aortic source of embolism  Time Spent Directly with the Patient:  60 minutes   Brandon Soto 01/03/2014, 9:25 AM

## 2014-01-03 NOTE — Telephone Encounter (Signed)
Brandon Soto - please advise on changing PPI.

## 2014-01-04 ENCOUNTER — Encounter (HOSPITAL_COMMUNITY): Payer: Self-pay | Admitting: Cardiovascular Disease

## 2014-01-05 ENCOUNTER — Ambulatory Visit: Payer: Medicare Other | Admitting: Cardiovascular Disease

## 2014-01-13 DIAGNOSIS — N529 Male erectile dysfunction, unspecified: Secondary | ICD-10-CM | POA: Insufficient documentation

## 2014-01-15 ENCOUNTER — Telehealth: Payer: Self-pay | Admitting: Internal Medicine

## 2014-01-15 NOTE — Telephone Encounter (Signed)
New problem    [place]

## 2014-01-16 ENCOUNTER — Ambulatory Visit (INDEPENDENT_AMBULATORY_CARE_PROVIDER_SITE_OTHER): Payer: Medicare Other | Admitting: *Deleted

## 2014-01-16 DIAGNOSIS — I639 Cerebral infarction, unspecified: Secondary | ICD-10-CM

## 2014-01-16 DIAGNOSIS — I6350 Cerebral infarction due to unspecified occlusion or stenosis of unspecified cerebral artery: Secondary | ICD-10-CM

## 2014-01-16 NOTE — Telephone Encounter (Signed)
LMOVM for pt to return call 

## 2014-01-17 NOTE — Telephone Encounter (Signed)
Attempt #2. LMOVM on both numbers for pt to return call.

## 2014-01-19 ENCOUNTER — Ambulatory Visit: Payer: Medicare Other | Admitting: Cardiovascular Disease

## 2014-01-19 NOTE — Progress Notes (Signed)
Loop recorder 

## 2014-01-19 NOTE — Telephone Encounter (Signed)
3rd attempt. LMOVM to call [employer] tech support and ask for phone transmitter piece to send transmission from a phone line.

## 2014-01-24 NOTE — Telephone Encounter (Deleted)
Received medication clarification from [facility] regarding the interaction between Omeprazole and Clopidogrel. Per Dr Nanetta Batty ok to change patient to Pantoprazole 40 mg. New prescription sent into pharmacy electronically. Call to patient to notify him as well.

## 2014-02-02 ENCOUNTER — Telehealth: Payer: Self-pay | Admitting: Cardiology

## 2014-02-02 NOTE — Telephone Encounter (Signed)
Informed pt that loop recorder is sending transmission and that we received one on 02-01-14. Pt verbalized understanding.

## 2014-02-02 NOTE — Telephone Encounter (Signed)
LMOVM for pt to return call 

## 2014-02-15 ENCOUNTER — Encounter: Payer: Self-pay | Admitting: Internal Medicine

## 2014-02-15 ENCOUNTER — Encounter: Payer: Medicare Other | Admitting: *Deleted

## 2014-02-15 LAB — MDC_IDC_ENUM_SESS_TYPE_REMOTE

## 2014-03-12 ENCOUNTER — Ambulatory Visit (INDEPENDENT_AMBULATORY_CARE_PROVIDER_SITE_OTHER): Payer: Medicare Other | Admitting: *Deleted

## 2014-03-12 DIAGNOSIS — I639 Cerebral infarction, unspecified: Secondary | ICD-10-CM

## 2014-03-12 DIAGNOSIS — I6350 Cerebral infarction due to unspecified occlusion or stenosis of unspecified cerebral artery: Secondary | ICD-10-CM

## 2014-03-22 NOTE — Progress Notes (Signed)
Loop recorder 

## 2014-03-26 ENCOUNTER — Other Ambulatory Visit: Payer: Self-pay | Admitting: *Deleted

## 2014-03-26 ENCOUNTER — Ambulatory Visit: Payer: Self-pay | Admitting: Nurse Practitioner

## 2014-04-06 LAB — MDC_IDC_ENUM_SESS_TYPE_REMOTE

## 2014-04-10 ENCOUNTER — Encounter: Payer: Self-pay | Admitting: Internal Medicine

## 2014-04-11 LAB — MDC_IDC_ENUM_SESS_TYPE_REMOTE

## 2014-04-16 ENCOUNTER — Encounter: Payer: Self-pay | Admitting: Internal Medicine

## 2014-04-16 ENCOUNTER — Ambulatory Visit (INDEPENDENT_AMBULATORY_CARE_PROVIDER_SITE_OTHER): Payer: Medicare Other | Admitting: *Deleted

## 2014-04-16 DIAGNOSIS — I639 Cerebral infarction, unspecified: Secondary | ICD-10-CM

## 2014-04-16 DIAGNOSIS — I6350 Cerebral infarction due to unspecified occlusion or stenosis of unspecified cerebral artery: Secondary | ICD-10-CM

## 2014-04-17 NOTE — Progress Notes (Signed)
Loop recorder 

## 2014-04-18 ENCOUNTER — Encounter: Payer: Self-pay | Admitting: Internal Medicine

## 2014-04-24 LAB — MDC_IDC_ENUM_SESS_TYPE_REMOTE

## 2014-05-16 ENCOUNTER — Encounter: Payer: Self-pay | Admitting: Internal Medicine

## 2014-05-16 ENCOUNTER — Ambulatory Visit (INDEPENDENT_AMBULATORY_CARE_PROVIDER_SITE_OTHER): Payer: Medicare Other | Admitting: *Deleted

## 2014-05-16 DIAGNOSIS — I639 Cerebral infarction, unspecified: Secondary | ICD-10-CM

## 2014-05-16 LAB — MDC_IDC_ENUM_SESS_TYPE_REMOTE

## 2014-05-25 NOTE — Progress Notes (Signed)
Loop recorder 

## 2014-06-15 ENCOUNTER — Encounter: Payer: Self-pay | Admitting: Internal Medicine

## 2014-06-15 ENCOUNTER — Ambulatory Visit (INDEPENDENT_AMBULATORY_CARE_PROVIDER_SITE_OTHER): Payer: Medicare Other | Admitting: *Deleted

## 2014-06-15 DIAGNOSIS — I639 Cerebral infarction, unspecified: Secondary | ICD-10-CM

## 2014-06-15 LAB — MDC_IDC_ENUM_SESS_TYPE_REMOTE

## 2014-06-16 ENCOUNTER — Ambulatory Visit (INDEPENDENT_AMBULATORY_CARE_PROVIDER_SITE_OTHER): Payer: 59 | Admitting: Family Medicine

## 2014-06-16 VITALS — BP 124/76 | HR 76 | Temp 98.0°F | Resp 18 | Ht 72.0 in | Wt 233.4 lb

## 2014-06-16 DIAGNOSIS — R197 Diarrhea, unspecified: Secondary | ICD-10-CM

## 2014-06-16 DIAGNOSIS — R109 Unspecified abdominal pain: Secondary | ICD-10-CM

## 2014-06-16 DIAGNOSIS — R112 Nausea with vomiting, unspecified: Secondary | ICD-10-CM

## 2014-06-16 LAB — COMPREHENSIVE METABOLIC PANEL
ALT: 22 U/L (ref 0–53)
AST: 19 U/L (ref 0–37)
Albumin: 4.3 g/dL (ref 3.5–5.2)
Alkaline Phosphatase: 58 U/L (ref 39–117)
BILIRUBIN TOTAL: 1.1 mg/dL (ref 0.2–1.2)
BUN: 17 mg/dL (ref 6–23)
CO2: 24 meq/L (ref 19–32)
CREATININE: 0.91 mg/dL (ref 0.50–1.35)
Calcium: 9.8 mg/dL (ref 8.4–10.5)
Chloride: 101 mEq/L (ref 96–112)
Glucose, Bld: 122 mg/dL — ABNORMAL HIGH (ref 70–99)
Potassium: 4.6 mEq/L (ref 3.5–5.3)
Sodium: 135 mEq/L (ref 135–145)
Total Protein: 7.4 g/dL (ref 6.0–8.3)

## 2014-06-16 LAB — POCT CBC
Granulocyte percent: 71.7 %G (ref 37–80)
HCT, POC: 50.2 % (ref 43.5–53.7)
HEMOGLOBIN: 16.5 g/dL (ref 14.1–18.1)
LYMPH, POC: 1.6 (ref 0.6–3.4)
MCH: 31.5 pg — AB (ref 27–31.2)
MCHC: 32.8 g/dL (ref 31.8–35.4)
MCV: 95.8 fL (ref 80–97)
MID (CBC): 0.5 (ref 0–0.9)
MPV: 7.5 fL (ref 0–99.8)
PLATELET COUNT, POC: 180 10*3/uL (ref 142–424)
POC Granulocyte: 5.2 (ref 2–6.9)
POC LYMPH PERCENT: 22 %L (ref 10–50)
POC MID %: 6.3 % (ref 0–12)
RBC: 5.24 M/uL (ref 4.69–6.13)
RDW, POC: 13.2 %
WBC: 7.2 10*3/uL (ref 4.6–10.2)

## 2014-06-16 LAB — LIPASE: Lipase: <10 U/L (ref 0–75)

## 2014-06-16 MED ORDER — METRONIDAZOLE 500 MG PO TABS: ORAL_TABLET | ORAL | Status: DC

## 2014-06-16 NOTE — Patient Instructions (Addendum)
I will be in touch with the rest of your labs asap. Take the flagyl three times a day- however collect your stool sample first  You might also try some imodium as needed Let me know if you are getting worse!

## 2014-06-16 NOTE — Progress Notes (Addendum)
[facility] [address], [place] [address] [phone]  Date:  06/16/2014   Name:  Brandon Soto   DOB:  1945-08-06   MRN:  [id number]  PCP:  [name], MD    Chief Complaint: Emesis; Diarrhea; Nausea; Chills; and Night Sweats   History of Present Illness:  Brandon Soto is a 68 y.o. very pleasant male patient who presents with the following:  Here today as a new patient with illness.  Extensive PMHx as below.   He has noted nausea, vomiting and diarreha. The diarrhea started about one week ago- was bad for a few days and got better.  However this past Wednesday he had recurrence of the diarrhea, and also nausea and vomiting.   He did have giardia in the past.   They have not measured a fever, but he has felt hot and cold.   No blood in his vomit or diarrhea.  His wife seems to be feeling well- no suspicious foods.   He tried to eat some soup yesterday but had immediate diarrhea.  He is drinking liquids- however even this will trigger diarrhea.  Last vomiting was a couple of days ago He still does have stomach cramps.   No travel.    No acute problems with his recent health issues.   He is on Plavix- he had 2 strokes in May  He has tried some pepto- nothing else so far.    He did take some abx- zpack- in early October for bronchitis.    Patient Active Problem List   Diagnosis Date Noted  . Acute CVA (cerebrovascular accident) 12/15/2013  . Acute ischemic stroke 12/14/2013  . Thyroid disease   . Hypertension   . Hypercholesteremia   . Heart attack   . Left knee DJD     Past Medical History  Diagnosis Date  . Hypertension   . Hypercholesteremia   . BPH (benign prostatic hyperplasia)   . PONV (postoperative nausea and vomiting)   . Coronary artery disease     s/p LAD intervention 2001  . Hypothyroidism     "shrank w/radiation ?1990's" (06/27/2012)  . GERD (gastroesophageal reflux disease)   . Left knee DJD   . Rheumatoid arthritis(714.0)      "all over; hands especially" (06/27/2012)  . Dysphagia   . CVA (cerebral infarction)     Past Surgical History  Procedure Laterality Date  . Coronary angioplasty with stent placement  2001    "1" (06/27/2012)  . Cardiac catheterization  ~ 2009  . Shoulder arthroscopy with rotator cuff repair and subacromial decompression  2005    "left" (06/27/2012)  . Coronary angioplasty    . Replacement unicondylar joint knee  06/27/2012    "left" (06/27/2012)  . Cholecystectomy  ~ 2006  . Joint replacement    . Partial knee arthroplasty  06/27/2012    Procedure: UNICOMPARTMENTAL KNEE;  Surgeon: Nilda Simmer, MD;  Location: [facility] OR;  Service: Orthopedics;  Laterality: Left;  left unicompartmental knee  . 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 Tee without cardioversion N/A 01/03/2014    Procedure: TRANSESOPHAGEAL ECHOCARDIOGRAM (TEE);  Surgeon: Thurmon Fair, MD;  Location: [facility] ENDOSCOPY;  Service: Cardiovascular;  Laterality: N/A;    History  Substance Use Topics  . Smoking status: Never Smoker   . Smokeless tobacco: Never Used  . Alcohol Use: No    Family History  Problem  Relation Age of Onset  . Heart attack Mother   . Heart disease Brother   . Cancer Brother   . Cancer - Lung Brother     Allergies  Allergen Reactions  . Penicillins     Pt unsure of reaction    Medication list has been reviewed and updated.  Current Outpatient Prescriptions on File Prior to Visit  Medication Sig Dispense Refill  . acetaminophen (TYLENOL) 325 MG tablet Take 650 mg by mouth every 6 (six) hours as needed for mild pain. For pain in hands    . atenolol (TENORMIN) 25 MG tablet Take 0.5 tablets (12.5 mg total) by mouth daily. 90 tablet 3  . CIALIS 5 MG tablet Take 1 tablet by mouth as needed.    . clopidogrel (PLAVIX) 75 MG tablet Take 1 tablet (75 mg total) by mouth daily with breakfast. 90 tablet 3  . levothyroxine (SYNTHROID, LEVOTHROID) 300 MCG  tablet Take 300 mcg by mouth daily before breakfast.    . meloxicam (MOBIC) 15 MG tablet Take 15 mg by mouth daily.    . Multiple Vitamin (MULTIVITAMIN WITH MINERALS) TABS Take 1 tablet by mouth daily.    [name] omega-3 acid ethyl esters (LOVAZA) 1 G capsule Take 2 g by mouth daily. Taking the [facility] brand    . pantoprazole (PROTONIX) 40 MG tablet Take 1 tablet (40 mg total) by mouth daily. 90 tablet 3  . rosuvastatin (CRESTOR) 20 MG tablet Take 1 tablet (20 mg total) by mouth at bedtime. 90 tablet 3   No current facility-administered medications on file prior to visit.    Review of Systems:  As per HPI- otherwise negative.   Physical Examination: Filed Vitals:   06/16/14 1022  BP: 124/76  Pulse: 76  Temp: 98 F (36.7 C)  Resp: 18   Filed Vitals:   06/16/14 1022  Height: 6' (1.829 m)  Weight: 233 lb 6.4 oz (105.87 kg)   Body mass index is 31.65 kg/(m^2). Ideal Body Weight: Weight in (lb) to have BMI = 25: 183.9  GEN: WDWN, NAD, Non-toxic, A & O x 3, overweight, looks well HEENT: Atraumatic, Normocephalic. Neck supple. No masses, No LAD.  Bilateral TM wnl, oropharynx normal.  PEERL,EOMI.   No evidence of dehydration Ears and Nose: No external deformity. CV: RRR, No M/G/R. No JVD. No thrill. No extra heart sounds. PULM: CTA B, no wheezes, crackles, rhonchi. No retractions. No resp. distress. No accessory muscle use. ABD: S, ND, +BS. No rebound. No HSM.  Mild tenderness in the upper abdomen.   EXTR: No c/c/e  NEURO Normal gait.  PSYCH: Normally interactive. Conversant. Not depressed or anxious appearing.  Calm demeanor.   Results for orders placed or performed in visit on 06/16/14  POCT CBC  Result Value Ref Range   WBC 7.2 4.6 - 10.2 K/uL   Lymph, poc 1.6 0.6 - 3.4   POC LYMPH PERCENT 22.0 10 - 50 %L   MID (cbc) 0.5 0 - 0.9   POC MID % 6.3 0 - 12 %M   POC Granulocyte 5.2 2 - 6.9   Granulocyte percent 71.7 37 - 80 %G   RBC 5.24 4.69 - 6.13 M/uL   Hemoglobin 16.5 14.1 - 18.1  g/dL   HCT, POC 64.4 03.4 - 53.7 %   MCV 95.8 80 - 97 fL   MCH, POC 31.5 (A) 27 - 31.2 pg   MCHC 32.8 31.8 - 35.4 g/dL   RDW, POC 74.2 %  Platelet Count, POC 180 142 - 424 K/uL   MPV 7.5 0 - 99.8 fL    Assessment and Plan: Diarrhea - Plan: Comprehensive metabolic panel, Clostridium difficile EIA, Stool culture, Ova and parasite examination, metroNIDAZOLE (FLAGYL) 500 MG tablet, DISCONTINUED: metroNIDAZOLE (FLAGYL) 500 MG tablet  Abdominal cramping - Plan: POCT CBC, Lipase  Non-intractable vomiting with nausea, vomiting of unspecified type   Here today with recurrent diarrhea- he is concerned about giardia, also recently used abx.  Will test for C diff and perform other stool studies as above.   Follow-up with further labs, he will let me know if any worsening in the meantime   Signed Abbe Amsterdam, MD

## 2014-06-18 ENCOUNTER — Telehealth: Payer: Self-pay | Admitting: *Deleted

## 2014-06-18 NOTE — Telephone Encounter (Signed)
Please call cell phones when labs for the stool samples come back

## 2014-06-19 LAB — OVA AND PARASITE EXAMINATION: OP: NONE SEEN

## 2014-06-19 NOTE — Progress Notes (Signed)
Loop recorder 

## 2014-06-20 LAB — STOOL CULTURE

## 2014-06-20 NOTE — Telephone Encounter (Signed)
Pt's wife called again saying she has been calling all week and has still not heard any results of Brandon Soto's lab results.  She says he is still very sick and they need to know if he is contagious.  They are out of town in [place].  Please call [phone]

## 2014-06-20 NOTE — Telephone Encounter (Signed)
Pt is using immodium and taking the Flagyl. Still having diarrhea and abd pain. Please advise lab results and if pt can take anything else.  Pt is in [place] right now. [facility]

## 2014-06-20 NOTE — Telephone Encounter (Signed)
Stool study results are NEGATIVE for ova and parasites, and culture. Often, diarrheal illness can be transmitted to others, through contact with infected stool.  Careful hand washing is very important to reduce risk of transmitting the illness.  If he is not improving, he should seek care where he is.

## 2014-06-20 NOTE — Telephone Encounter (Signed)
Called and LMOM with his wife.  I am sorry,  I had not realized they needed a call until the results were in and his culture just came in today.  So far there is no identified cause for his diarrhea (c diff still pending).  I agree that he should be seen in [place] if he is not doing ok.  I hope that he feels better soon!

## 2014-06-20 NOTE — Telephone Encounter (Signed)
Returned wife's call in regards to labs . Reports that they will seek care if he gets any worse.

## 2014-06-26 HISTORY — PX: COLONOSCOPY: SHX174

## 2014-06-29 ENCOUNTER — Telehealth: Payer: Self-pay | Admitting: Radiology

## 2014-06-29 ENCOUNTER — Other Ambulatory Visit: Payer: Self-pay | Admitting: *Deleted

## 2014-06-29 MED ORDER — CLOPIDOGREL BISULFATE 75 MG PO TABS: 75.0000 mg | ORAL_TABLET | Freq: Every day | ORAL | Status: DC

## 2014-06-29 NOTE — Telephone Encounter (Signed)
Called [facility] to check status of Cdiff, Lupe Carney has spoken to the lab to see if the specimen is still there, Loney Loh indicates they do not have the order for the Cdiff culture, the specimen has been discarded.

## 2014-06-29 NOTE — Telephone Encounter (Signed)
Please give them a call.  We are so sorry but c diff sample did not get processed for whatever reason.  If he is still ill we can give him supplies to recollect.  If he is better no need to do this test.  Please give him my apologies.   Thanks! JC

## 2014-06-29 NOTE — Telephone Encounter (Signed)
Refilled electronically 

## 2014-07-01 NOTE — Telephone Encounter (Signed)
Pt's wife returning missed call. She states that Brandon Soto is still sick, but he has an appointment tomorrow for a colonoscopy She stated that they will wait to see what that Dr. Fredna Dow. Please advise it further action is needed.

## 2014-07-01 NOTE — Telephone Encounter (Signed)
-----   Message from Pearline Cables, MD sent at 06/29/2014  7:33 PM EST ----- Call and check on him re: c diff

## 2014-07-01 NOTE — Telephone Encounter (Signed)
Called and [facility] for pt.  I hope that all goes well with his colonoscopy tomorrow and let me know if there is anything I can do to help.  He called back and we were able to discuss.  He is actually having a GI consultation tomorrow; he does not have diarrhea anymore but does have stomach cramps.

## 2014-07-02 ENCOUNTER — Telehealth: Payer: Self-pay | Admitting: *Deleted

## 2014-07-02 ENCOUNTER — Ambulatory Visit (INDEPENDENT_AMBULATORY_CARE_PROVIDER_SITE_OTHER): Payer: Medicare Other | Admitting: Nurse Practitioner

## 2014-07-02 ENCOUNTER — Encounter: Payer: Self-pay | Admitting: Nurse Practitioner

## 2014-07-02 VITALS — BP 120/66 | HR 68 | Ht 71.5 in | Wt 234.5 lb

## 2014-07-02 DIAGNOSIS — Z1211 Encounter for screening for malignant neoplasm of colon: Secondary | ICD-10-CM | POA: Insufficient documentation

## 2014-07-02 DIAGNOSIS — R197 Diarrhea, unspecified: Secondary | ICD-10-CM

## 2014-07-02 MED ORDER — POLYETHYLENE GLYCOL 3350 17 GM/SCOOP PO POWD: 1.0000 | Freq: Every day | ORAL | Status: DC

## 2014-07-02 NOTE — Telephone Encounter (Signed)
RE: COSIMO SCHERTZER DOB: 07-03-1946 MRN: [id number]   Dear Dr. Nanetta Batty,    We have scheduled the above patient for an endoscopic procedure. Our records show that he is on anticoagulation therapy.   Please advise as to how long the patient may come off his therapy of Plavix  prior to the procedure, which is scheduled for 07-10-2014.  Please fax back/ or route the completed form to [facility] CMA at [phone].   Sincerely,    Willette Cluster NP-C    Lowry Ram CMA

## 2014-07-02 NOTE — Progress Notes (Signed)
HPI :  Patient is a 68 year old male known remotely to Dr. Leone Payor. He had a screening colonoscopy January 2005 with findings of mild sigmoid diverticulosis. He is due for repeat colon cancer screening. Patient had a CVA in May, he has since been on Plavix.   Patient had Giardia in the 1980s. Since that time he has had "flares" of diarrhea for which he takes Flagyl. A few weeks ago patient developed nausea, vomiting, stomach cramps and diarrhea. Patient initially thought it was one of his"flares" though diarrhea was much worse and abdominal cramping was unusual for him. He did have antibiotics for bronchitis in October. His PCP mentioned possibility of C. Difficile. It sounds like stool studies were ordered but for some reason C. Difficile study not done. Patient was treated empirically with 10 days of Flagyl. He has improved from 15 loose bowel movements a day to about 4 a day now. Still having some abdominal cramps, nausea and vomiting have subsided   Past Medical History  Diagnosis Date  . Hypertension   . Hypercholesteremia   . BPH (benign prostatic hyperplasia)   . PONV (postoperative nausea and vomiting)   . Coronary artery disease     s/p LAD intervention 2001  . Hypothyroidism     "shrank w/radiation ?1990's" (06/27/2012)  . GERD (gastroesophageal reflux disease)   . Left knee DJD   . Rheumatoid arthritis(714.0)     "all over; hands especially" (06/27/2012)  . CVA (cerebral infarction)     Family History  Problem Relation Age of Onset  . Heart attack Mother   . Heart disease Brother   . Liver cancer Brother     deceased  . Cancer - Lung Brother     deceased   History  Substance Use Topics  . Smoking status: Never Smoker   . Smokeless tobacco: Never Used  . Alcohol Use: No   Current Outpatient Prescriptions  Medication Sig Dispense Refill  . acetaminophen (TYLENOL) 325 MG tablet Take 650 mg by mouth every 6 (six) hours as needed for mild pain. For pain in hands      . atenolol (TENORMIN) 25 MG tablet Take 0.5 tablets (12.5 mg total) by mouth daily. 90 tablet 3  . CIALIS 5 MG tablet Take 1 tablet by mouth as needed.    . clopidogrel (PLAVIX) 75 MG tablet Take 1 tablet (75 mg total) by mouth daily with breakfast. 90 tablet 1  . levothyroxine (SYNTHROID, LEVOTHROID) 300 MCG tablet Take 300 mcg by mouth daily before breakfast.    . meloxicam (MOBIC) 15 MG tablet Take 15 mg by mouth daily.    . Multiple Vitamin (MULTIVITAMIN WITH MINERALS) TABS Take 1 tablet by mouth daily.    [name] omega-3 acid ethyl esters (LOVAZA) 1 G capsule Take 2 g by mouth daily. Taking the [facility] brand    . pantoprazole (PROTONIX) 40 MG tablet Take 1 tablet (40 mg total) by mouth daily. 90 tablet 3  . rosuvastatin (CRESTOR) 20 MG tablet Take 1 tablet (20 mg total) by mouth at bedtime. 90 tablet 3   No current facility-administered medications for this visit.   Allergies  Allergen Reactions  . Penicillins     Pt unsure of reaction     Review of Systems: Mild unintentional weight loss, arthritis, and cough. All other systems reviewed and negative except where noted in HPI.   Physical Exam: BP 120/66 mmHg  Pulse 68  Ht 5' 11.5" (1.816 m)  Wt 234 lb 8  oz (106.369 kg)  BMI 32.25 kg/m2 Constitutional: Pleasant,well-developed, white male in no acute distress. HEENT: Normocephalic and atraumatic. Conjunctivae are normal. No scleral icterus. Neck supple.  Cardiovascular: Normal rate, regular rhythm.  Pulmonary/chest: Effort normal and breath sounds normal. No wheezing, rales or rhonchi. Abdominal: Soft, nondistended, nontender. Bowel sounds active throughout. There are no masses palpable. No hepatomegaly. Extremities: no edema Lymphadenopathy: No cervical adenopathy noted. Neurological: Alert and oriented to person place and time. Skin: Skin is warm and dry. No rashes noted. Psychiatric: Normal mood and affect. Behavior is normal.   ASSESSMENT AND PLAN:   53. 68 year old male  with chronic, intermittent diarrhea which usually resolves with a course of metronidazole. Not sure if he has irritable bowel syndrome vrs small intestine bacterial overgrowth.   2. Recent episode of nausea, vomiting, stomach cramps and severe diarrhea after antibiotics in October. Unfortunately C. Difficile study not done but patient was treated empirically with 10 days of Flagyl. His diarrhea has significantly improved having gone from 15  To 4 loose bowel movements a day. He is still having some abdominal cramping. Nausea and vomiting resolved. This could have been c-diff.  He will send in stool for c-diff if diarrhea does not improve or certainly if it worsens. Recommended a 30 day course of Align probiotic. Low fiber diet over the next couple of weeks.  3. Colon cancer screening. Last colonoscopy over 10 years ago. Patient is due for repeat screening.The risks, benefits, and alternatives to colonoscopy with possible biopsy and possible polypectomy were discussed with the patient and he consents to proceed.   4. History of CVA (cryptogenic?) in May 2015. Patient is on Plavix. We will contact prescribing provider about holding Plavix prior to procedure

## 2014-07-02 NOTE — Patient Instructions (Addendum)
Please go to the basement level to the lab for the stool test kit.   If diarrhea doesn't improve, complete instructions for the C Diff stool test and bring sample back to our lab.  Eat low fiber for 2 weeks.  Take Align probiotic , 1 cap daily. [facility] at [facility] or [facility].  We sent a prescription for the generic miralax  To [facility] [address]. You have been scheduled for a colonoscopy. Please follow written instructions given to you at your visit today.  Please pick up your prep kit at the pharmacy within the next 1-3 days. Generic Miralax. If you use inhalers (even only as needed), please bring them with you on the day of your procedure. Your physician has requested that you go to www.startemmi.com and enter the access code given to you at your visit today. This web site gives a general overview about your procedure. However, you should still follow specific instructions given to you by our office regarding your preparation for the procedure.  We will call you once we hear back from Dr. Quay Burow regarding the Plavix clearance.

## 2014-07-02 NOTE — Progress Notes (Signed)
Agree with Ms. Zehr's management.  Jaimin Krupka E. Kainen Struckman, MD, FACG  

## 2014-07-02 NOTE — Telephone Encounter (Signed)
It is okay to stop his Plavix for his endoscopic procedure

## 2014-07-03 ENCOUNTER — Other Ambulatory Visit: Payer: Self-pay | Admitting: *Deleted

## 2014-07-03 MED ORDER — CLOPIDOGREL BISULFATE 75 MG PO TABS: 75.0000 mg | ORAL_TABLET | Freq: Every day | ORAL | Status: DC

## 2014-07-04 ENCOUNTER — Telehealth: Payer: Self-pay | Admitting: Cardiovascular Disease

## 2014-07-04 NOTE — Telephone Encounter (Signed)
{  t need a new prescription for his Plavix #90 and refills.. Please send or call [facility]

## 2014-07-04 NOTE — Telephone Encounter (Signed)
I called and advised the patient to stop his Plavix on 07-05-2014 and he can resume it on 07-11-2014. Dr. Allyson Sabal advised he can stop his plavix before the procedure and we recommend 5 days. Pt verbaized understanding the instructions.

## 2014-07-04 NOTE — Telephone Encounter (Signed)
No answer   RN reviewed patient's snap shot- medication was sent to [facility] on 07/03/14 with 90 day supply x 1.

## 2014-07-05 ENCOUNTER — Encounter (HOSPITAL_COMMUNITY): Payer: Self-pay | Admitting: Internal Medicine

## 2014-07-09 ENCOUNTER — Encounter: Payer: Self-pay | Admitting: Internal Medicine

## 2014-07-10 ENCOUNTER — Encounter: Payer: Self-pay | Admitting: Internal Medicine

## 2014-07-10 ENCOUNTER — Ambulatory Visit (AMBULATORY_SURGERY_CENTER): Payer: Medicare Other | Admitting: Internal Medicine

## 2014-07-10 VITALS — BP 123/73 | HR 47 | Temp 97.9°F | Resp 18 | Ht 71.5 in | Wt 234.0 lb

## 2014-07-10 DIAGNOSIS — Z1211 Encounter for screening for malignant neoplasm of colon: Secondary | ICD-10-CM

## 2014-07-10 DIAGNOSIS — R197 Diarrhea, unspecified: Secondary | ICD-10-CM

## 2014-07-10 MED ORDER — SODIUM CHLORIDE 0.9 % IV SOLN: 500.0000 mL | INTRAVENOUS | Status: DC

## 2014-07-10 NOTE — Progress Notes (Signed)
Called to room to assist during endoscopic procedure.  Patient ID and intended procedure confirmed with present staff. Received instructions for my participation in the procedure from the performing physician.Called to room to assist during endoscopic procedure.   

## 2014-07-10 NOTE — Progress Notes (Signed)
Stable to RR 

## 2014-07-10 NOTE — Progress Notes (Signed)
Biopsies from colonoscopy sent [facility] per order Dr. Leone Payor @ [phone] 07/10/14

## 2014-07-10 NOTE — Op Note (Signed)
[facility] [address] [place], [address]   COLONOSCOPY PROCEDURE REPORT  PATIENT: Brandon Soto, Brandon Soto  MR#: [id number] BIRTHDATE: 01-03-1946 , 68  yrs. old GENDER: male ENDOSCOPIST: Iva Boop, MD, [facility] PROCEDURE DATE:  07/10/2014 PROCEDURE:   Colonoscopy with biopsy First Screening Colonoscopy - Avg.  risk and is 50 yrs.  old or older - No.  Prior Negative Screening - Now for repeat screening. N/A  History of Adenoma - Now for follow-up colonoscopy & has been > or = to 3 yrs.  N/A  Polyps Removed Today? No.  Recommend repeat exam, <10 yrs? Polyps Removed Today? No.  Recommend repeat exam, <10 yrs? No. ASA CLASS:   Class II INDICATIONS:average risk for colorectal cancer and unexplained diarrhea. MEDICATIONS: Propofol 250 mg IV and Monitored anesthesia care  DESCRIPTION OF PROCEDURE:   After the risks benefits and alternatives of the procedure were thoroughly explained, informed consent was obtained.  The digital rectal exam revealed no abnormalities of the rectum, revealed no prostatic nodules, and revealed the prostate was not enlarged.   The LB CF-H180AL Loaner [id number]  endoscope was introduced through the anus and advanced to the cecum, which was identified by both the appendix and ileocecal valve. No adverse events experienced.   The quality of the prep was excellent, using MiraLax  The instrument was then slowly withdrawn as the colon was fully examined.   COLON FINDINGS: The examined terminal ileum appeared to be normal. The colonic mucosa appeared normal throughout the entire examined colon.  Multiple random biopsies were performed using cold forceps. Samples were sent to R/O microscopic colitis.   There was diverticulosis noted in the sigmoid colon.  Retroflexed views revealed no abnormalities. The time to cecum=2 minutes 06 seconds. Withdrawal time=11 minutes 32 seconds.  The scope was withdrawn and the procedure completed. COMPLICATIONS: There  were no immediate complications.  ENDOSCOPIC IMPRESSION: 1.   The examined terminal ileum appeared to be normal 2.   The colonic mucosa appeared normal throughout the entire examined colon; multiple random biopsies were performed using cold forceps 3.   Diverticulosis was noted in the sigmoid colon  RECOMMENDATIONS: Await biopsy results - ? microscopic colitis, if not that ? medication side effect, bacterial overgrowth. If biopsies are negative will consider Xifaxan since metronidazole has provided relef in past.  eSigned:  Iva Boop, MD, [facility] 07/10/2014 10:46 AM   cc: Jenita Seashore, MD and The Patient

## 2014-07-10 NOTE — Patient Instructions (Addendum)
I did not see any polyps today. You do have diverticulosis.  I took biopsies of the colon to see if that would explain the diarrhea. I will call - hopefully this week. No signs of an infection.  Please use Imodium AD to treat the diarrhea for now.  I appreciate the opportunity to care for you. Iva Boop, MD, FACG        YOU HAD AN ENDOSCOPIC PROCEDURE TODAY AT THE [facility]: Refer to the procedure report that was given to you for any specific questions about what was found during the examination.  If the procedure report does not answer your questions, please call your gastroenterologist to clarify.  If you requested that your care partner not be given the details of your procedure findings, then the procedure report has been included in a sealed envelope for you to review at your convenience later.  YOU SHOULD EXPECT: Some feelings of bloating in the abdomen. Passage of more gas than usual.  Walking can help get rid of the air that was put into your GI tract during the procedure and reduce the bloating. If you had a lower endoscopy (such as a colonoscopy or flexible sigmoidoscopy) you may notice spotting of blood in your stool or on the toilet paper. If you underwent a bowel prep for your procedure, then you may not have a normal bowel movement for a few days.  DIET: Your first meal following the procedure should be a light meal and then it is ok to progress to your normal diet.  A half-sandwich or bowl of soup is an example of a good first meal.  Heavy or fried foods are harder to digest and may make you feel nauseous or bloated.  Likewise meals heavy in dairy and vegetables can cause extra gas to form and this can also increase the bloating.  Drink plenty of fluids but you should avoid alcoholic beverages for 24 hours.  ACTIVITY: Your care partner should take you home directly after the procedure.  You should plan to take it easy, moving slowly for the rest of  the day.  You can resume normal activity the day after the procedure however you should NOT DRIVE or use heavy machinery for 24 hours (because of the sedation medicines used during the test).    SYMPTOMS TO REPORT IMMEDIATELY: A gastroenterologist can be reached at any hour.  During normal business hours, 8:30 AM to 5:00 PM Monday through Friday, call [phone].  After hours and on weekends, please call the GI answering service at [phone] who will take a message and have the physician on call contact you.   Following lower endoscopy (colonoscopy or flexible sigmoidoscopy):  Excessive amounts of blood in the stool  Significant tenderness or worsening of abdominal pains  Swelling of the abdomen that is new, acute  Fever of 100F or higher   FOLLOW UP: If any biopsies were taken you will be contacted by phone or by letter within the next 1-3 weeks.  Call your gastroenterologist if you have not heard about the biopsies in 3 weeks.  Our staff will call the home number listed on your records the next business day following your procedure to check on you and address any questions or concerns that you may have at that time regarding the information given to you following your procedure. This is a courtesy call and so if there is no answer at the home number and we have not heard  from you through the emergency physician on call, we will assume that you have returned to your regular daily activities without incident.  SIGNATURES/CONFIDENTIALITY: You and/or your care partner have signed paperwork which will be entered into your electronic medical record.  These signatures attest to the fact that that the information above on your After Visit Summary has been reviewed and is understood.  Full responsibility of the confidentiality of this discharge information lies with you and/or your care-partner.    Resume medications. Information given on diverticulosis with discharge instructions.

## 2014-07-11 ENCOUNTER — Telehealth: Payer: Self-pay | Admitting: *Deleted

## 2014-07-11 NOTE — Telephone Encounter (Signed)
  Follow up Call-  Call back number 07/10/2014  Post procedure Call Back phone  # [phone]  Permission to leave phone message Yes     Patient questions:  Do you have a fever, pain , or abdominal swelling? No. Pain Score  0 *  Have you tolerated food without any problems? Yes.    Have you been able to return to your normal activities? Yes.    Do you have any questions about your discharge instructions: Diet   No. Medications  No. Follow up visit  No.  Do you have questions or concerns about your Care? No.  Actions: * If pain score is 4 or above: No action needed, pain <4.  Pt states doing better since 6, been having diarrhea. Pt states that is normal for him. Encouraged patient to continue to eat and drink and to call us back if needed. RM

## 2014-07-12 ENCOUNTER — Ambulatory Visit (INDEPENDENT_AMBULATORY_CARE_PROVIDER_SITE_OTHER): Payer: Medicare Other | Admitting: *Deleted

## 2014-07-12 DIAGNOSIS — I639 Cerebral infarction, unspecified: Secondary | ICD-10-CM

## 2014-07-13 ENCOUNTER — Other Ambulatory Visit: Payer: Self-pay

## 2014-07-13 ENCOUNTER — Telehealth: Payer: Self-pay | Admitting: Internal Medicine

## 2014-07-13 ENCOUNTER — Telehealth: Payer: Self-pay

## 2014-07-13 MED ORDER — RIFAXIMIN 550 MG PO TABS: 550.0000 mg | ORAL_TABLET | Freq: Three times a day (TID) | ORAL | Status: DC

## 2014-07-13 NOTE — Telephone Encounter (Signed)
Faxed information to [facility] pharmacy to get them to assist [place] in getting xifaxan approved for diarrhea.  Faxed office notes and labs.

## 2014-07-13 NOTE — Progress Notes (Signed)
Quick Note:  Let him know bxs are NL I recommend he try Xifaxan 550 mg tid x 14 days for diarrhea and f/u prn if that does not work  [facility] No recall (will be 79 in 10 yrs) and no letter ______

## 2014-07-13 NOTE — Telephone Encounter (Signed)
Patient notified of the results and the recommendations

## 2014-07-17 NOTE — Telephone Encounter (Signed)
Per Darcey Nora, RN-CGRN [facility] denied patients request for xifaxan.  They said he needs documentation of IBS and he also needs to try and fail Bentyl.  I informed patient that we will ask Dr. Leone Payor to advise when he returns to the office.  He said he did ten days of the Flagyl which helped for several days post rx.  Currently he reports doing well since the procedure in regards to diarrhea.

## 2014-07-17 NOTE — Progress Notes (Signed)
Loop recorder 

## 2014-07-23 NOTE — Telephone Encounter (Signed)
Left message on both mobile and home numbers to call me back.

## 2014-07-23 NOTE — Telephone Encounter (Signed)
Sounds like diarrhea is not currently a problem - if that is still the case we will not add any medicine now Let me know He can call back if diarrhea is a problem again

## 2014-07-23 NOTE — Telephone Encounter (Signed)
Spoke with patient and he is doing well, no diarrhea currently.  He was happy to hear no new medicine for now.  He will call us back if diarrhea returns.

## 2014-08-07 LAB — MDC_IDC_ENUM_SESS_TYPE_REMOTE

## 2014-08-14 ENCOUNTER — Ambulatory Visit (INDEPENDENT_AMBULATORY_CARE_PROVIDER_SITE_OTHER): Payer: Self-pay | Admitting: *Deleted

## 2014-08-14 DIAGNOSIS — I639 Cerebral infarction, unspecified: Secondary | ICD-10-CM

## 2014-08-14 NOTE — Progress Notes (Signed)
Loop recorder 

## 2014-08-15 LAB — MDC_IDC_ENUM_SESS_TYPE_REMOTE

## 2014-08-24 ENCOUNTER — Encounter: Payer: Self-pay | Admitting: Internal Medicine

## 2014-08-29 ENCOUNTER — Telehealth: Payer: Self-pay | Admitting: *Deleted

## 2014-08-29 NOTE — Telephone Encounter (Signed)
Pt made aware to expect call from Glynda Jaeger to schedule follow up appt with JA regarding brady episodes on LINQ.

## 2014-09-13 ENCOUNTER — Ambulatory Visit (INDEPENDENT_AMBULATORY_CARE_PROVIDER_SITE_OTHER): Payer: Medicare Other | Admitting: *Deleted

## 2014-09-13 DIAGNOSIS — I639 Cerebral infarction, unspecified: Secondary | ICD-10-CM | POA: Diagnosis not present

## 2014-09-13 LAB — MDC_IDC_ENUM_SESS_TYPE_REMOTE

## 2014-09-14 NOTE — Progress Notes (Signed)
Loop recorder 

## 2014-09-20 ENCOUNTER — Ambulatory Visit (INDEPENDENT_AMBULATORY_CARE_PROVIDER_SITE_OTHER): Payer: Medicare Other | Admitting: Internal Medicine

## 2014-09-20 ENCOUNTER — Other Ambulatory Visit: Payer: Self-pay

## 2014-09-20 ENCOUNTER — Encounter: Payer: Self-pay | Admitting: Internal Medicine

## 2014-09-20 VITALS — BP 112/62 | HR 53 | Ht 73.0 in | Wt 235.6 lb

## 2014-09-20 DIAGNOSIS — R0683 Snoring: Secondary | ICD-10-CM

## 2014-09-20 DIAGNOSIS — I639 Cerebral infarction, unspecified: Secondary | ICD-10-CM | POA: Insufficient documentation

## 2014-09-20 DIAGNOSIS — I441 Atrioventricular block, second degree: Secondary | ICD-10-CM

## 2014-09-20 DIAGNOSIS — R400 Somnolence: Secondary | ICD-10-CM

## 2014-09-20 DIAGNOSIS — G4710 Hypersomnia, unspecified: Secondary | ICD-10-CM

## 2014-09-20 LAB — MDC_IDC_ENUM_SESS_TYPE_INCLINIC
Date Time Interrogation Session: 20160225141522
MDC IDC SET ZONE DETECTION INTERVAL: 2000 ms
Zone Setting Detection Interval: 3000 ms
Zone Setting Detection Interval: 370 ms

## 2014-09-20 NOTE — Progress Notes (Signed)
Electrophysiology Office Note   Date:  09/20/2014   ID:  Brandon Soto, Brandon Soto May 15, 1946, MRN [id number]  PCP:  [name], MD  Cardiologist:  Dr Allyson Sabal Primary Electrophysiologist: Hillis Range, MD    Chief Complaint  Patient presents with  . Follow-up    snoring     History of Present Illness: Brandon Soto is a 69 y.o. male who presents today for electrophysiology evaluation.   The patient is doing reasonably well.  He has been noted to have nocturnal bradycardia with prolonged PP intervals and second degree AV block found on ILR interrogations.  He is asymptomatic with this.  Per his wife, he snores heavily.  He has daytime fatigue.  Today, he denies symptoms of palpitations, chest pain, shortness of breath, orthopnea, PND, lower extremity edema, claudication, dizziness, presyncope, syncope, bleeding, or neurologic sequela. The patient is tolerating medications without difficulties and is otherwise without complaint today.    Past Medical History  Diagnosis Date  . Hypertension   . Hypercholesteremia   . BPH (benign prostatic hyperplasia)   . PONV (postoperative nausea and vomiting)   . Coronary artery disease     s/p LAD intervention 2001  . Hypothyroidism     "shrank w/radiation ?1990's" (06/27/2012)  . GERD (gastroesophageal reflux disease)   . Left knee DJD   . Rheumatoid arthritis(714.0)     "all over; hands especially" (06/27/2012)  . CVA (cerebral infarction)    Past Surgical History  Procedure Laterality Date  . Coronary angioplasty with stent placement  2001    "1" (06/27/2012)  . Cardiac catheterization  ~ 2009  . Shoulder arthroscopy with rotator cuff repair and subacromial decompression Left 2005    (06/27/2012)  . Coronary angioplasty    . Cholecystectomy  ~ 2006  . Partial knee arthroplasty  06/27/2012    Procedure: UNICOMPARTMENTAL KNEE;  Surgeon: Nilda Simmer, MD;  Location: [facility] OR;  Service: Orthopedics;  Laterality: Left;  left unicompartmental knee  .  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 Tee without cardioversion N/A 01/03/2014    Procedure: TRANSESOPHAGEAL ECHOCARDIOGRAM (TEE);  Surgeon: Thurmon Fair, MD;  Location: [facility] ENDOSCOPY;  Service: Cardiovascular;  Laterality: N/A;  . Loop recorder implant N/A 12/15/2013    Procedure: LOOP RECORDER IMPLANT;  Surgeon: Gardiner Rhyme, MD;  Location: [facility] CATH LAB;  Service: Cardiovascular;  Laterality: N/A;  . Colonoscopy  06/2014     Current Outpatient Prescriptions  Medication Sig Dispense Refill  . acetaminophen (TYLENOL) 325 MG tablet Take 650 mg by mouth every 6 (six) hours as needed for mild pain. For pain in hands    . atenolol (TENORMIN) 25 MG tablet Take 0.5 tablets (12.5 mg total) by mouth daily. 90 tablet 3  . CIALIS 5 MG tablet Take 1 tablet by mouth as needed.    . clopidogrel (PLAVIX) 75 MG tablet Take 1 tablet (75 mg total) by mouth daily with breakfast. 90 tablet 1  . levothyroxine (SYNTHROID, LEVOTHROID) 300 MCG tablet Take 300 mcg by mouth daily before breakfast.    . meloxicam (MOBIC) 15 MG tablet Take 15 mg by mouth daily as needed for pain.     . Multiple Vitamin (MULTIVITAMIN WITH MINERALS) TABS Take 1 tablet by mouth daily.    [name] omega-3 acid ethyl esters (LOVAZA) 1 G capsule Take 2 g by mouth daily. Taking the [facility] brand    . pantoprazole (PROTONIX) 40 MG  tablet Take 1 tablet (40 mg total) by mouth daily. 90 tablet 3  . rosuvastatin (CRESTOR) 20 MG tablet Take 1 tablet (20 mg total) by mouth at bedtime. 90 tablet 3   No current facility-administered medications for this visit.    Allergies:   Penicillins   Social History:  The patient  reports that he has never smoked. He has never used smokeless tobacco. He reports that he does not drink alcohol or use illicit drugs.   Family History:  The patient's family history includes Cancer - Lung in his brother; Heart attack in his mother; Heart disease in his brother; Liver  cancer in his brother.    ROS:  Please see the history of present illness.   All other systems are reviewed and negative.    PHYSICAL EXAM: VS:  BP 112/62 mmHg  Pulse 53  Ht 6\' 1"  (1.854 m)  Wt 235 lb 9.6 oz (106.867 kg)  BMI 31.09 kg/m2 , BMI Body mass index is 31.09 kg/(m^2). GEN: Well nourished, well developed, in no acute distress HEENT: normal Neck: no JVD, carotid bruits, or masses Cardiac: RRR; no murmurs, rubs, or gallops,no edema  Respiratory:  clear to auscultation bilaterally, normal work of breathing GI: soft, nontender, nondistended, + BS MS: no deformity or atrophy Skin: warm and dry, ILR site is well healed Neuro:  Strength and sensation are intact Psych: euthymic mood, full affect   Device interrogation is reviewed today in detail.  See PaceArt for details.   Recent Labs: 12/15/2013: Platelets 165; TSH 0.144* 06/16/2014: ALT 22; BUN 17; Creatinine 0.91; Hemoglobin 16.5; Potassium 4.6; Sodium 135    Lipid Panel     Component Value Date/Time   CHOL 109 12/15/2013 0840   TRIG 127 12/15/2013 0840   HDL 41 12/15/2013 0840   CHOLHDL 2.7 12/15/2013 0840   VLDL 25 12/15/2013 0840   LDLCALC 43 12/15/2013 0840     Wt Readings from Last 3 Encounters:  09/20/14 235 lb 9.6 oz (106.867 kg)  07/10/14 234 lb (106.142 kg)  07/02/14 234 lb 8 oz (106.369 kg)      ASSESSMENT AND PLAN:  1.  Second degree AV block Nocturnal and asymptomatic Increased PP intervals are consistent with increased vagal tone No indication for pacing Will evaluate for sleep apnea  2. Snoring/ fatigue He had a sleep study several years ago but none recently He snores heavily Will proceed with sleep study at this time  3. cyptogenic stroke Followed with ILR remotely per protocol No afib noted  Current medicines are reviewed at length with the patient today.   The patient does not have concerns regarding his medicines.  The following changes were made today:  none  Labs/ tests  ordered today include:  Orders Placed This Encounter  Procedures  . Split night study    Follow-up with Dr 14/07/15 I will see as needed  Signed, Allyson Sabal, MD  09/20/2014 2:10 PM     [facility] [address] [place] [phone] (office) [phone] (fax)

## 2014-09-20 NOTE — Patient Instructions (Signed)
Your physician recommends that you schedule a follow-up appointment as needed with Dr Johney Frame continue as scheduled follow up with Dr Tresa Endo as scheduled  Your physician has recommended that you have a sleep study. This test records several body functions during sleep, including: brain activity, eye movement, oxygen and carbon dioxide blood levels, heart rate and rhythm, breathing rate and rhythm, the flow of air through your mouth and nose, snoring, body muscle movements, and chest and belly movement.

## 2014-09-21 ENCOUNTER — Encounter: Payer: Self-pay | Admitting: Internal Medicine

## 2014-10-08 ENCOUNTER — Encounter: Payer: Self-pay | Admitting: Internal Medicine

## 2014-10-09 ENCOUNTER — Encounter: Payer: Self-pay | Admitting: Internal Medicine

## 2014-10-12 ENCOUNTER — Ambulatory Visit (INDEPENDENT_AMBULATORY_CARE_PROVIDER_SITE_OTHER): Payer: Medicare Other | Admitting: *Deleted

## 2014-10-12 DIAGNOSIS — I639 Cerebral infarction, unspecified: Secondary | ICD-10-CM | POA: Diagnosis not present

## 2014-10-19 NOTE — Progress Notes (Signed)
Loop recorder 

## 2014-10-24 ENCOUNTER — Encounter: Payer: Self-pay | Admitting: *Deleted

## 2014-10-24 ENCOUNTER — Telehealth: Payer: Self-pay | Admitting: Cardiovascular Disease

## 2014-10-24 LAB — MDC_IDC_ENUM_SESS_TYPE_REMOTE

## 2014-10-24 NOTE — Telephone Encounter (Signed)
Spoke with kristin in the device clinic, the loop recorder shows pauses at night and second degree AV block. Dr Johney Frame is aware of these findings and is seeing the patient back prn. The question at this point is if the patient needs to continue with the atenolol. He is currently taking the 25 mg every other day, he is unable to cut the tablets in one half. Will forward to dr croitoru (DOD) for his review

## 2014-10-24 NOTE — Telephone Encounter (Signed)
In my opinion, he should stop the atenolol permanently

## 2014-10-24 NOTE — Telephone Encounter (Signed)
Spoke with pt wife, she voiced understanding for the patient to stop the atenolol.

## 2014-10-24 NOTE — Telephone Encounter (Signed)
Spoke with brigette, according to the patient his heart rate is getting in the 20's at night. I spoke with the device clinic and they receive information from his loop recorder nightly, they are going to pull the information and call me back. brigette aware and she ask me to call the patient back with any instructions, aware will call once data received.

## 2014-10-24 NOTE — Telephone Encounter (Signed)
Brandon Soto is calling because Brandon Soto heart rate has been falling into the 20 at night , he has a loop recorder a he is on Atenolol 12.5 daily , but cannot cut pills in half so he takes 25mg  every other day . Does he needs to still take Atenolol  With his heart rate at 20. Please call the patient and tell him what to do.    Thanks

## 2014-11-02 ENCOUNTER — Encounter: Payer: Self-pay | Admitting: Internal Medicine

## 2014-11-12 ENCOUNTER — Other Ambulatory Visit: Payer: Self-pay | Admitting: Internal Medicine

## 2014-11-12 ENCOUNTER — Ambulatory Visit (INDEPENDENT_AMBULATORY_CARE_PROVIDER_SITE_OTHER): Payer: Medicare Other | Admitting: *Deleted

## 2014-11-12 DIAGNOSIS — I639 Cerebral infarction, unspecified: Secondary | ICD-10-CM

## 2014-11-12 LAB — CUP PACEART REMOTE DEVICE CHECK: MDC IDC SESS DTM: 20160517160756

## 2014-11-14 NOTE — Progress Notes (Signed)
Loop recorder 

## 2014-11-15 ENCOUNTER — Encounter: Payer: Self-pay | Admitting: *Deleted

## 2014-11-25 ENCOUNTER — Ambulatory Visit (HOSPITAL_BASED_OUTPATIENT_CLINIC_OR_DEPARTMENT_OTHER): Payer: Medicare Other | Attending: Internal Medicine | Admitting: Radiology

## 2014-11-25 VITALS — Ht 73.0 in | Wt 218.0 lb

## 2014-11-25 DIAGNOSIS — G4710 Hypersomnia, unspecified: Secondary | ICD-10-CM | POA: Diagnosis not present

## 2014-11-25 DIAGNOSIS — G4733 Obstructive sleep apnea (adult) (pediatric): Secondary | ICD-10-CM | POA: Diagnosis not present

## 2014-11-25 DIAGNOSIS — G4719 Other hypersomnia: Secondary | ICD-10-CM | POA: Diagnosis present

## 2014-11-25 DIAGNOSIS — R0683 Snoring: Secondary | ICD-10-CM

## 2014-11-25 DIAGNOSIS — R400 Somnolence: Secondary | ICD-10-CM

## 2014-11-28 ENCOUNTER — Telehealth: Payer: Self-pay | Admitting: Internal Medicine

## 2014-11-28 NOTE — Telephone Encounter (Signed)
Called patient back. Talked to Lennie Muckle, patient's spouse and DPR. Informed her that the study results will be sent to Dr. Mayford Knife and once she receives the study she will decide what plan the patient needs to go forward with. Explained that this process takes up to a week at times. Patient's spouse verbalized understanding. Encourage them to call with any other questions or concerns.

## 2014-11-28 NOTE — Telephone Encounter (Signed)
New Message  General Note  Pt states that he recently visited [facility] for a sleep study. Pt states that after the study was completed, he was advised that he would need a mask for the sleep disorder and he has not recieved a follow up call since the study. please call back to discuss how to move forward with care.

## 2014-11-29 ENCOUNTER — Telehealth: Payer: Self-pay | Admitting: Cardiology

## 2014-11-29 ENCOUNTER — Encounter (HOSPITAL_BASED_OUTPATIENT_CLINIC_OR_DEPARTMENT_OTHER): Payer: Self-pay | Admitting: Radiology

## 2014-11-29 DIAGNOSIS — G4733 Obstructive sleep apnea (adult) (pediatric): Secondary | ICD-10-CM

## 2014-11-29 HISTORY — DX: Obstructive sleep apnea (adult) (pediatric): G47.33

## 2014-11-29 NOTE — Telephone Encounter (Signed)
Pt is aware of results. Follow-up appt made for 02/08/15 @ 8:30am [facility] has been notified of orders.

## 2014-11-29 NOTE — Addendum Note (Signed)
Addended by: Armanda Magic R on: 11/29/2014 01:15 PM   Modules accepted: Orders

## 2014-11-29 NOTE — Telephone Encounter (Signed)
Please let patient know that they have sleep apnea and had successful CPAP titration and will be set up with CPAP unit and I will see patient back in office in 10 weeks.  Please let DME know that order is in [facility]

## 2014-11-29 NOTE — Sleep Study (Addendum)
NAME: Brandon Soto DATE OF BIRTH:  [birth year] MEDICAL RECORD NUMBER [id number]  LOCATION: [facility]  PHYSICIAN: TURNER,TRACI R  DATE OF STUDY: 11/25/2014  SLEEP STUDY TYPE: Split Night Nocturnal Polysomnogram and CPAP titration               REFERRING PHYSICIAN: Hillis Range, MD  INDICATION FOR STUDY: Snoring and excessive daytime sleepiness  EPWORTH SLEEPINESS SCORE: 2 HEIGHT: 6\' 1"  (185.4 cm)  WEIGHT: 218 lb (98.884 kg)    Body mass index is 28.77 kg/(m^2).  NECK SIZE: 16.5 in.  MEDICATIONS: Reviewed in the chart  SLEEP ARCHITECTURE: During the diagnostic portion of the study, the patient slept for a total of 129 minutes out of a total sleep period of 155 minutes.  There was no slow wave sleep and 48 minutes of REM sleep.  The onset to sleep latency was 10 minutes and the onset to REM sleep latency was slightly shortened at 69 minutes.  The sleep efficiency was 78%.  During the CPAP titration, the patient slept for a total of 185 minutes out of a total sleep period of 191 minutes.  There was no slow wave sleep and 30 minutes of REM sleep.  The onset to REM sleep latency was normal at 102 minutes.  The sleep efficiency was normal at 95%.  RESPIRATORY DATA: During the diagnostic portion of the study, there were 4 obstructive apneas and 26 hypopneas.  The total AHI was 14 events per hour consistent with mild to moderate obstructive sleep apnea/hypopnea syndrome.  Most events occurred in REM sleep in the nonsupine position.  There was mild to moderate snoring noted.  The patient was started on CPAP at 6cm H2O and titrated for respiratory events and snoring to 14cm H2O.  The patient was able to reach REM supine sleep at optimum pressure and maintain this pressure without further respiratory events.  THe AHI was 2.1 events per hour on CPAP at 14cm H2O.  Snoring was eliminated with CPAP.    OXYGEN DATA: The average oxygen saturation was 93% and lowest oxygen saturation  84% during the diagnostic portion.  During the CPAP titration, the average oxygen saturation was 96% and the lowest oxygen saturation was 84%.  The time spent with oxygen saturations <88% on CPAP at 0.2 minutes.    CARDIAC DATA: The patient maintained NSR throughout the study.  The average heart rate was 53 bpm.  THe lowest heart rate was 43 bpm and the highest heart rate was 79 bpm.    MOVEMENT/PARASOMNIA: There were an increased number of periodic limb movements with a PLMS index of 18.6 per hour during the diagnostic portion and these resolved with CPAP.  There were no REM sleep behavior disorders.  IMPRESSION/ RECOMMENDATION:   1.  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  Most events occurred in REM sleep in the nonsupine position.   2.  Mild to moderate snoring was noted which was eliminated with CPAP. 3.  Abnormal sleep architecture with no slow wave sleep. 4.  Reduced sleep efficiency with increased frequency of awakenings due to respiratory events. 5.  Oxygen desaturations as low as 84% during respiratory events during the diagnostic portion of the study. The time spent with oxygen saturations <88% on CPAP was 0.2 minutes.   . 6.  Periodic limb movements were noted during the diagnostic portion that resolved with CPAP. 7.  Successful CPAP titration to 14cm H2O.  The patient was able to reach REM supine sleep at optimum pressure and maintain this pressure without further respiratory events.  THe AHI was 2.1 events per hour on CPAP at 14cm H2O. 8.  The patient should be counseled in good sleep hygiene. 9.  Recommend ResMed CPAP at 14cm H2O with heated humidifier, medium Resmed Airfit F10 full face mask.  Signed: Quintella Reichert Diplomate, American Board of Sleep Medicine  ELECTRONICALLY SIGNED ON:  11/29/2014, 12:57 PM [facility] PH: [phone]   FX: [phone] ACCREDITED BY THE AMERICAN ACADEMY OF SLEEP  MEDICINE

## 2014-12-12 ENCOUNTER — Ambulatory Visit (INDEPENDENT_AMBULATORY_CARE_PROVIDER_SITE_OTHER): Payer: Medicare Other | Admitting: *Deleted

## 2014-12-12 DIAGNOSIS — I639 Cerebral infarction, unspecified: Secondary | ICD-10-CM

## 2014-12-14 NOTE — Progress Notes (Signed)
Loop recorder 

## 2014-12-27 ENCOUNTER — Encounter: Payer: Self-pay | Admitting: Internal Medicine

## 2014-12-27 LAB — CUP PACEART REMOTE DEVICE CHECK: Date Time Interrogation Session: 20160602143939

## 2015-01-07 ENCOUNTER — Encounter: Payer: Self-pay | Admitting: Internal Medicine

## 2015-01-11 ENCOUNTER — Ambulatory Visit (INDEPENDENT_AMBULATORY_CARE_PROVIDER_SITE_OTHER): Payer: Medicare Other | Admitting: *Deleted

## 2015-01-11 ENCOUNTER — Encounter: Payer: Self-pay | Admitting: Cardiology

## 2015-01-11 DIAGNOSIS — I639 Cerebral infarction, unspecified: Secondary | ICD-10-CM

## 2015-01-14 NOTE — Progress Notes (Signed)
Loop recorder 

## 2015-01-22 DIAGNOSIS — K219 Gastro-esophageal reflux disease without esophagitis: Secondary | ICD-10-CM | POA: Insufficient documentation

## 2015-02-04 ENCOUNTER — Encounter: Payer: Self-pay | Admitting: Internal Medicine

## 2015-02-08 ENCOUNTER — Encounter: Payer: Self-pay | Admitting: Cardiology

## 2015-02-08 ENCOUNTER — Ambulatory Visit (INDEPENDENT_AMBULATORY_CARE_PROVIDER_SITE_OTHER): Payer: Medicare Other | Admitting: Cardiology

## 2015-02-08 VITALS — BP 132/72 | HR 59 | Ht 73.0 in | Wt 227.2 lb

## 2015-02-08 DIAGNOSIS — G4733 Obstructive sleep apnea (adult) (pediatric): Secondary | ICD-10-CM

## 2015-02-08 DIAGNOSIS — I10 Essential (primary) hypertension: Secondary | ICD-10-CM

## 2015-02-08 DIAGNOSIS — E669 Obesity, unspecified: Secondary | ICD-10-CM | POA: Diagnosis not present

## 2015-02-08 NOTE — Patient Instructions (Signed)

## 2015-02-08 NOTE — Progress Notes (Addendum)
Cardiology Office Note   Date:  02/08/2015   ID:  Brandon Soto, DOB August 01, 1945, MRN [id number]  PCP:  [name], MD    Chief Complaint  Patient presents with  . Sleep Apnea      History of Present Illness: Brandon Soto is a 69 y.o. male who presents for evaluation of snoring and excessive daytime sleepiness.  His epworth sleepiness scale is 2.  He underwent PSG showing mild to moderate OSA with an AHI of 14/hr.  Most events occurred in REM non supine sleep with oxygen desaturations as low as 84%.  He underwent successful CPAP titration to 14cm H2O.  He now presents for followup. He is doing well with his CPAP therapy.  He tolerates his CPAP device.  He tolerates the nasal pillow mask and feels the pressure is adequate. Since starting CPAP he has not really noticed any change in his daytime sleepiness and still takes a nap daily but his snoring has resolved according to his wife.  He has no nasal congestion but does have some mouth dryness and is not wearing his chin strap.  He works a lot outside and gets exercise that way.      Past Medical History  Diagnosis Date  . Hypertension   . BPH (benign prostatic hyperplasia)   . PONV (postoperative nausea and vomiting)   . Coronary artery disease     s/p LAD intervention 2001  . Hypothyroidism     "shrank w/radiation ?1990's" (06/27/2012)  . GERD (gastroesophageal reflux disease)   . Left knee DJD   . Rheumatoid arthritis(714.0)     "all over; hands especially" (06/27/2012)  . CVA (cerebral infarction)   . Dyslipidemia   . OSA (obstructive sleep apnea) 11/29/2014    Past Surgical History  Procedure Laterality Date  . Coronary angioplasty with stent placement  2001    "1" (06/27/2012)  . Cardiac catheterization  ~ 2009  . Shoulder arthroscopy with rotator cuff repair and subacromial decompression Left 2005    (06/27/2012)  . Coronary angioplasty    . Cholecystectomy  ~ 2006  . Partial knee arthroplasty  06/27/2012     Procedure: UNICOMPARTMENTAL KNEE;  Surgeon: Nilda Simmer, MD;  Location: [facility] OR;  Service: Orthopedics;  Laterality: Left;  left unicompartmental knee  . 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 Tee without cardioversion N/A 01/03/2014    Procedure: TRANSESOPHAGEAL ECHOCARDIOGRAM (TEE);  Surgeon: Thurmon Fair, MD;  Location: [facility] ENDOSCOPY;  Service: Cardiovascular;  Laterality: N/A;  . Loop recorder implant N/A 12/15/2013    Procedure: LOOP RECORDER IMPLANT;  Surgeon: Gardiner Rhyme, MD;  Location: [facility] CATH LAB;  Service: Cardiovascular;  Laterality: N/A;  . Colonoscopy  06/2014  . Stent placement N/A   . [employer] screening mammo bil/cad ([facility] hx)    . Angioplasty N/A      Current Outpatient Prescriptions  Medication Sig Dispense Refill  . acetaminophen (TYLENOL) 325 MG tablet Take 650 mg by mouth every 6 (six) hours as needed for mild pain. For pain in hands    . clopidogrel (PLAVIX) 75 MG tablet Take 1 tablet (75 mg total) by mouth daily with breakfast. 90 tablet 1  . levothyroxine (SYNTHROID, LEVOTHROID) 125 MCG tablet Take 2 tablets by mouth 2 (two) times daily.    [name] losartan (COZAAR) 50 MG  tablet Take 50 mg by mouth daily.    . Multiple Vitamin (MULTIVITAMIN WITH MINERALS) TABS Take 1 tablet by mouth daily.    [name] omega-3 acid ethyl esters (LOVAZA) 1 G capsule Take 2 g by mouth daily. Taking the [facility] brand    . pantoprazole (PROTONIX) 40 MG tablet Take 1 tablet (40 mg total) by mouth daily. 90 tablet 3  . ranitidine (ZANTAC) 150 MG tablet Take 150 mg by mouth 2 (two) times daily.    . rosuvastatin (CRESTOR) 20 MG tablet Take 1 tablet (20 mg total) by mouth at bedtime. 90 tablet 3   No current facility-administered medications for this visit.    Allergies:   Penicillins    Social History:  The patient  reports that he has never smoked. He has never used smokeless tobacco. He reports that he does not drink alcohol or use illicit  drugs.   Family History:  The patient's family history includes Cancer - Lung in his brother; Heart attack in his mother; Heart disease in his brother; Liver cancer in his brother.    ROS:  Please see the history of present illness.   Otherwise, review of systems are positive for none.   All other systems are reviewed and negative.    PHYSICAL EXAM: VS:  BP 132/72 mmHg  Pulse 59  Ht 6\' 1"  (1.854 m)  Wt 227 lb 3.2 oz (103.057 kg)  BMI 29.98 kg/m2  SpO2 96% , BMI Body mass index is 29.98 kg/(m^2). GEN: Well nourished, well developed, in no acute distress HEENT: normal Neck: no JVD, carotid bruits, or masses Cardiac: RRR; no murmurs, rubs, or gallops,no edema  Respiratory:  clear to auscultation bilaterally, normal work of breathing GI: soft, nontender, nondistended, + BS MS: no deformity or atrophy Skin: warm and dry, no rash Neuro:  Strength and sensation are intact Psych: euthymic mood, full affect   EKG:  EKG is not ordered today.    Recent Labs: 06/16/2014: ALT 22; BUN 17; Creat 0.91; Hemoglobin 16.5; Potassium 4.6; Sodium 135    Lipid Panel    Component Value Date/Time   CHOL 109 12/15/2013 0840   TRIG 127 12/15/2013 0840   HDL 41 12/15/2013 0840   CHOLHDL 2.7 12/15/2013 0840   VLDL 25 12/15/2013 0840   LDLCALC 43 12/15/2013 0840      Wt Readings from Last 3 Encounters:  02/08/15 227 lb 3.2 oz (103.057 kg)  11/25/14 218 lb (98.884 kg)  09/20/14 235 lb 9.6 oz (106.867 kg)        ASSESSMENT AND PLAN:  1.  Mild to moderate OSA with an AHI of 14/hr now on CPAP and doing well.  Patient has been using and benefiting from CPAP use and will continue to benefit from therapy. His d/l today showed an AHI of 0.9/hr on CPAP at 14cm H2O with 97% compliance in using more than 4 hours nightly.  He will continue on current settings.  I have recommended that he start using the chin strap to help with dry mouth since I suspect that he is breathing some through his mouth. I  have also instructed the patient on proper sleep hygiene, avoidance of sleeping in the supine position and avoidance of alcohol within 4 hours of bedtime.  The patient was also instructed to avoid driving if sleepy.   2.  HTN - controlled on Cozaar 3.  Obesity - I encouraged him to try to get out walking some to drop some weight.  His  exercise is limited by knee pain.   Current medicines are reviewed at length with the patient today.  The patient does not have concerns regarding medicines.  The following changes have been made:  no change  Labs/ tests ordered today: See above Assessment and Plan No orders of the defined types were placed in this encounter.     Disposition:   FU with me in 6 months  Signed, Quintella Reichert, MD  02/08/2015 9:22 AM    [facility] [address] [place], [place], [place]  [address] Phone: [phone]; Fax: [phone]

## 2015-02-11 ENCOUNTER — Ambulatory Visit (INDEPENDENT_AMBULATORY_CARE_PROVIDER_SITE_OTHER): Payer: Medicare Other | Admitting: *Deleted

## 2015-02-11 DIAGNOSIS — I639 Cerebral infarction, unspecified: Secondary | ICD-10-CM | POA: Diagnosis not present

## 2015-02-12 NOTE — Progress Notes (Signed)
Loop recorder 

## 2015-02-14 LAB — CUP PACEART REMOTE DEVICE CHECK: Date Time Interrogation Session: 20160721155707

## 2015-03-07 ENCOUNTER — Encounter: Payer: Self-pay | Admitting: Internal Medicine

## 2015-03-12 ENCOUNTER — Ambulatory Visit (INDEPENDENT_AMBULATORY_CARE_PROVIDER_SITE_OTHER): Payer: Medicare Other | Admitting: *Deleted

## 2015-03-12 DIAGNOSIS — I639 Cerebral infarction, unspecified: Secondary | ICD-10-CM

## 2015-03-12 NOTE — Progress Notes (Signed)
Loop recorder 

## 2015-03-25 LAB — CUP PACEART REMOTE DEVICE CHECK: MDC IDC SESS DTM: 20160829114124

## 2015-04-03 ENCOUNTER — Encounter: Payer: Self-pay | Admitting: Internal Medicine

## 2015-04-09 ENCOUNTER — Other Ambulatory Visit: Payer: Self-pay | Admitting: Cardiovascular Disease

## 2015-04-11 ENCOUNTER — Ambulatory Visit (INDEPENDENT_AMBULATORY_CARE_PROVIDER_SITE_OTHER): Payer: Medicare Other | Admitting: *Deleted

## 2015-04-11 DIAGNOSIS — I639 Cerebral infarction, unspecified: Secondary | ICD-10-CM | POA: Diagnosis not present

## 2015-04-12 NOTE — Progress Notes (Signed)
Loop recorder 

## 2015-04-24 DIAGNOSIS — N62 Hypertrophy of breast: Secondary | ICD-10-CM | POA: Insufficient documentation

## 2015-05-04 LAB — CUP PACEART REMOTE DEVICE CHECK: Date Time Interrogation Session: 20160915173721

## 2015-05-04 NOTE — Progress Notes (Signed)
Carelink summary report received. Battery status OK. Normal device function. No new symptom episodes, tachy episodes, brady episodes. No new AF episodes. 1 pause---nocturnal. Monthly summary reports and ROV w/ JA PRN.

## 2015-05-13 ENCOUNTER — Ambulatory Visit (INDEPENDENT_AMBULATORY_CARE_PROVIDER_SITE_OTHER): Payer: Medicare Other | Admitting: *Deleted

## 2015-05-13 DIAGNOSIS — I639 Cerebral infarction, unspecified: Secondary | ICD-10-CM

## 2015-05-15 NOTE — Progress Notes (Signed)
LOOP RECORDER  

## 2015-05-17 ENCOUNTER — Encounter: Payer: Self-pay | Admitting: Internal Medicine

## 2015-05-28 LAB — CUP PACEART REMOTE DEVICE CHECK: Date Time Interrogation Session: 20161015180620

## 2015-05-28 NOTE — Progress Notes (Signed)
Carelink summary report received. Battery status OK. Normal device function. No new symptom episodes, tachy episodes, brady, or pause episodes. 1 AF episode, EGM suggests Mobitz I HB, duration , nocturnal. Monthly summary reports and ROV with JA PRN.

## 2015-05-30 ENCOUNTER — Encounter: Payer: Self-pay | Admitting: Cardiology

## 2015-06-10 ENCOUNTER — Ambulatory Visit (INDEPENDENT_AMBULATORY_CARE_PROVIDER_SITE_OTHER): Payer: Medicare Other | Admitting: *Deleted

## 2015-06-10 DIAGNOSIS — I639 Cerebral infarction, unspecified: Secondary | ICD-10-CM

## 2015-06-11 NOTE — Progress Notes (Signed)
Carelink summary Report / Loop recorder 

## 2015-07-05 ENCOUNTER — Ambulatory Visit (INDEPENDENT_AMBULATORY_CARE_PROVIDER_SITE_OTHER): Payer: Medicare Other | Admitting: Cardiovascular Disease

## 2015-07-05 ENCOUNTER — Encounter: Payer: Self-pay | Admitting: Cardiovascular Disease

## 2015-07-05 VITALS — BP 124/70 | HR 60 | Ht 73.0 in | Wt 229.0 lb

## 2015-07-05 DIAGNOSIS — E7800 Pure hypercholesterolemia, unspecified: Secondary | ICD-10-CM

## 2015-07-05 DIAGNOSIS — I10 Essential (primary) hypertension: Secondary | ICD-10-CM | POA: Diagnosis not present

## 2015-07-05 DIAGNOSIS — R0683 Snoring: Secondary | ICD-10-CM

## 2015-07-05 MED ORDER — LOSARTAN POTASSIUM 50 MG PO TABS: 50.0000 mg | ORAL_TABLET | Freq: Every day | ORAL | Status: DC

## 2015-07-05 MED ORDER — ROSUVASTATIN CALCIUM 20 MG PO TABS: 20.0000 mg | ORAL_TABLET | Freq: Every day | ORAL | Status: DC

## 2015-07-05 MED ORDER — CLOPIDOGREL BISULFATE 75 MG PO TABS: 75.0000 mg | ORAL_TABLET | Freq: Every day | ORAL | Status: DC

## 2015-07-05 NOTE — Progress Notes (Signed)
07/05/2015 Brandon Soto   Oct 11, 1945  [id number]  Primary Physician [name], MD Primary Cardiologist: Runell Gess MD Brandon Soto   HPI:  The patient is a very pleasant 69 year old, mildly overweight, married, Caucasian male father of 2, grandfather to 3 grandchildren who I last saw in the office 12/21/13.[name] He has a history of CAD status post LAD intervention by Dr. Jorje Guild in 2001 in the setting of a myocardial infarction. I catheterization him in 2004 revealing a patent stent with an anteroapical wall motion abnormality and EF of 45-50%. His other problems include hypertension, hyperlipidemia, and hypothyroidism. He denies chest pain or shortness of breath. His last Myoview performed in October of 2011 showed apical scar, and echo showed a normal EF. His most recent lab work revealed a total cholesterol of 146, LDL of 72, and HDL 49.he had a left total knee replacement performed by Dr. Hadassah Pais 06/27/12 which was uncomplicated. He denies chest pain or shortness of breath. His primary care physician, Brandon Soto, follows his lipid profile closely. He had a recent stroke apparently in 2 vascular territories. Workup has been unrevealing including carotid Dopplers which were essentially normal and a transthoracic echo that did not show an embolic source. Dr. Hillis Range placed a loop recorder to rule out paroxysmal atrial fibrillation as a cause. He was placed on Plavix. He has not had any arrhythmias suggesting his troches were arrhythmogenic.he also has had a sleep study and as result of that was placed on C Pap By Dr. Johney Frame.   Current Outpatient Prescriptions  Medication Sig Dispense Refill  . acetaminophen (TYLENOL) 325 MG tablet Take 650 mg by mouth every 6 (six) hours as needed for mild pain. For pain in hands    . clopidogrel (PLAVIX) 75 MG tablet Take 1 tablet (75 mg total) by mouth daily. 90 tablet 3  . levothyroxine (SYNTHROID, LEVOTHROID) 175 MCG tablet Take 175  mcg by mouth daily before breakfast.    . losartan (COZAAR) 50 MG tablet Take 1 tablet (50 mg total) by mouth daily. 90 tablet 3  . Multiple Vitamin (MULTIVITAMIN WITH MINERALS) TABS Take 1 tablet by mouth daily.    [name] omega-3 acid ethyl esters (LOVAZA) 1 G capsule Take 2 g by mouth daily. Taking the [facility] brand    . ranitidine (ZANTAC) 150 MG tablet Take 150 mg by mouth 2 (two) times daily.    . rosuvastatin (CRESTOR) 20 MG tablet Take 1 tablet (20 mg total) by mouth at bedtime. 90 tablet 3   No current facility-administered medications for this visit.    No Known Allergies  Social History   Social History  . Marital Status: Married    Spouse Name: N/A  . Number of Children: 2  . Years of Education: N/A   Occupational History  . retired    Social History Main Topics  . Smoking status: Never Smoker   . Smokeless tobacco: Never Used  . Alcohol Use: No  . Drug Use: No  . Sexual Activity: Yes   Other Topics Concern  . Not on file   Social History Narrative     Review of Systems: General: negative for chills, fever, night sweats or weight changes.  Cardiovascular: negative for chest pain, dyspnea on exertion, edema, orthopnea, palpitations, paroxysmal nocturnal dyspnea or shortness of breath Dermatological: negative for rash Respiratory: negative for cough or wheezing Urologic: negative for hematuria Abdominal: negative for nausea, vomiting, diarrhea, bright red blood per rectum, melena, or  hematemesis Neurologic: negative for visual changes, syncope, or dizziness All other systems reviewed and are otherwise negative except as noted above.    Blood pressure 124/70, pulse 60, height 6\' 1"  (1.854 m), weight 229 lb (103.874 kg).  General appearance: alert and no distress Neck: no adenopathy, no carotid bruit, no JVD, supple, symmetrical, trachea midline and thyroid not enlarged, symmetric, no tenderness/mass/nodules Lungs: clear to auscultation bilaterally Heart: regular  rate and rhythm, S1, S2 normal, no murmur, click, rub or gallop Extremities: extremities normal, atraumatic, no cyanosis or edema  EKG sinus rhythm at 60 with septal Q waves. Personally reviewed this EKG  ASSESSMENT AND PLAN:   Snoring Currently wears C Pap  Hypertension History of hypertension blood pressure measured at 124/70. He is on losartan. Continue current meds at current dosing  Hypercholesteremia History of hyperlipidemia on Crestor. We will recheck a lipid and liver profile  Heart attack History of CAD status post LAD intervention by Dr. in 2001 in the setting of a myocardial infarction. I catheterized him in 2004 revealing a patent stent with anteroapical wall motion abnormality and an EF of 45-50% he denies chest pain or shortness of breath.      2005 MD FACP,FACC,FAHA, [facility] 07/05/2015 2:52 PM

## 2015-07-05 NOTE — Assessment & Plan Note (Signed)
Currently wears C Pap

## 2015-07-05 NOTE — Patient Instructions (Addendum)
Medication Instructions:  Your physician recommends that you continue on your current medications as directed. Please refer to the Current Medication list given to you today.   Labwork: Your physician recommends that you return for lab work in: FASTING (lipid/liver) The lab can be found on the FIRST FLOOR of out building in [address]   Testing/Procedures: none  Follow-Up: Your physician wants you to follow-up in: 12 months with DR. Allyson Sabal. You will receive a reminder letter in the mail two months in advance. If you don't receive a letter, please call our office to schedule the follow-up appointment.   Any Other Special Instructions Will Be Listed Below (If Applicable).     If you need a refill on your cardiac medications before your next appointment, please call your pharmacy.

## 2015-07-05 NOTE — Assessment & Plan Note (Signed)
History of hypertension blood pressure measured at 124/70. He is on losartan. Continue current meds at current dosing

## 2015-07-05 NOTE — Assessment & Plan Note (Signed)
History of hyperlipidemia on Crestor. We will recheck a lipid and liver profile 

## 2015-07-05 NOTE — Assessment & Plan Note (Signed)
History of CAD status post LAD intervention by Dr. Jorje Guild in 2001 in the setting of a myocardial infarction. I catheterized him in 2004 revealing a patent stent with anteroapical wall motion abnormality and an EF of 45-50% he denies chest pain or shortness of breath.

## 2015-07-10 ENCOUNTER — Ambulatory Visit (INDEPENDENT_AMBULATORY_CARE_PROVIDER_SITE_OTHER): Payer: Medicare Other | Admitting: *Deleted

## 2015-07-10 DIAGNOSIS — I639 Cerebral infarction, unspecified: Secondary | ICD-10-CM

## 2015-07-10 LAB — HEPATIC FUNCTION PANEL
ALK PHOS: 50 U/L (ref 40–115)
ALT: 27 U/L (ref 9–46)
AST: 23 U/L (ref 10–35)
Albumin: 4 g/dL (ref 3.6–5.1)
BILIRUBIN INDIRECT: 0.7 mg/dL (ref 0.2–1.2)
Bilirubin, Direct: 0.3 mg/dL — ABNORMAL HIGH (ref <=0.2)
TOTAL PROTEIN: 6.7 g/dL (ref 6.1–8.1)
Total Bilirubin: 1 mg/dL (ref 0.2–1.2)

## 2015-07-10 LAB — LIPID PANEL
CHOLESTEROL: 101 mg/dL — AB (ref 125–200)
HDL: 39 mg/dL — ABNORMAL LOW (ref >=40)
LDL Cholesterol: 42 mg/dL (ref <130)
TRIGLYCERIDES: 99 mg/dL (ref <150)
Total CHOL/HDL Ratio: 2.6 Ratio (ref <=5.0)
VLDL: 20 mg/dL (ref <30)

## 2015-07-10 NOTE — Progress Notes (Signed)
Carelink Summary Report / Loop Recorder 

## 2015-07-15 LAB — CUP PACEART REMOTE DEVICE CHECK: MDC IDC SESS DTM: 20161114180916

## 2015-08-09 ENCOUNTER — Ambulatory Visit (INDEPENDENT_AMBULATORY_CARE_PROVIDER_SITE_OTHER): Payer: Medicare Other | Admitting: *Deleted

## 2015-08-09 DIAGNOSIS — I639 Cerebral infarction, unspecified: Secondary | ICD-10-CM

## 2015-08-12 NOTE — Progress Notes (Signed)
Carelink Summary Report / Loop Recorder 

## 2015-08-14 ENCOUNTER — Encounter: Payer: Self-pay | Admitting: Internal Medicine

## 2015-08-24 LAB — CUP PACEART REMOTE DEVICE CHECK: MDC IDC SESS DTM: 20161214183655

## 2015-09-09 ENCOUNTER — Ambulatory Visit (INDEPENDENT_AMBULATORY_CARE_PROVIDER_SITE_OTHER): Payer: Medicare Other | Admitting: *Deleted

## 2015-09-09 DIAGNOSIS — I639 Cerebral infarction, unspecified: Secondary | ICD-10-CM | POA: Diagnosis not present

## 2015-09-09 NOTE — Progress Notes (Signed)
Carelink Summary Report / Loop Recorder 

## 2015-09-23 NOTE — Progress Notes (Signed)
Cardiology Office Note   Date:  09/24/2015   ID:  Brandon Soto, DOB 10/20/1945, MRN [id number]  PCP:  Brandon Balls, MD    Chief Complaint  Patient presents with  . Sleep Apnea  . Hypertension      History of Present Illness: Brandon Soto is a 70 y.o. male  who presents for followup of mild to moderate OSA with an AHI of 14/hr. Most events occurred in REM non supine sleep with oxygen desaturations as low as 84%. He is on CPAP at 14cm H2O.He is doing well with his CPAP therapy. He tolerates his CPAP device. He tolerates the nasal pillow mask and feels the pressure is adequate.  He has no nasal congestion but does have some mouth dryness. He works a lot outside and gets exercise that way but no formal aerobic exercise. he sleeps fairly well at night.  He has no daytime sleepiness.      Past Medical History  Diagnosis Date  . Hypertension   . BPH (benign prostatic hyperplasia)   . PONV (postoperative nausea and vomiting)   . Coronary artery disease     s/p LAD intervention 2001  . Hypothyroidism     "shrank w/radiation ?1990's" (06/27/2012)  . GERD (gastroesophageal reflux disease)   . Left knee DJD   . Rheumatoid arthritis(714.0)     "all over; hands especially" (06/27/2012)  . CVA (cerebral infarction)   . Dyslipidemia   . OSA (obstructive sleep apnea) 11/29/2014    Past Surgical History  Procedure Laterality Date  . Coronary angioplasty with stent placement  2001    "1" (06/27/2012)  . Cardiac catheterization  ~ 2009  . Shoulder arthroscopy with rotator cuff repair and subacromial decompression Left 2005    (06/27/2012)  . Coronary angioplasty    . Cholecystectomy  ~ 2006  . Partial knee arthroplasty  06/27/2012    Procedure: UNICOMPARTMENTAL KNEE;  Surgeon: Nilda Simmer, MD;  Location: [facility] OR;  Service: Orthopedics;  Laterality: Left;  left unicompartmental knee  . Loop recorder implant  12-15-13    MDT LinQ implanted by Dr Johney Frame for cryptogenic  stroke  . Tee without cardioversion N/A 01/03/2014    Procedure: TRANSESOPHAGEAL ECHOCARDIOGRAM (TEE);  Surgeon: Thurmon Fair, MD;  Location: [facility] ENDOSCOPY;  Service: Cardiovascular;  Laterality: N/A;  . Loop recorder implant N/A 12/15/2013    Procedure: LOOP RECORDER IMPLANT;  Surgeon: Gardiner Rhyme, MD;  Location: [facility] CATH LAB;  Service: Cardiovascular;  Laterality: N/A;  . Colonoscopy  06/2014  . Stent placement N/A   . [employer] screening mammo bil/cad ([facility] hx)    . Angioplasty N/A      Current Outpatient Prescriptions  Medication Sig Dispense Refill  . acetaminophen (TYLENOL) 325 MG tablet Take 650 mg by mouth every 6 (six) hours as needed for mild pain. For pain in hands    . clopidogrel (PLAVIX) 75 MG tablet Take 1 tablet (75 mg total) by mouth daily. 90 tablet 3  . levothyroxine (SYNTHROID, LEVOTHROID) 175 MCG tablet Take 175 mcg by mouth daily before breakfast.    . losartan (COZAAR) 50 MG tablet Take 1 tablet (50 mg total) by mouth daily. 90 tablet 3  . Multiple Vitamin (MULTIVITAMIN WITH MINERALS) TABS Take 1 tablet by mouth daily.    [name] omega-3 acid ethyl esters (LOVAZA) 1 G capsule Take 2 g by mouth daily. Taking the [facility]  brand    . rosuvastatin (CRESTOR) 20 MG tablet Take 1 tablet (20 mg total) by mouth at bedtime. 90 tablet 3   No current facility-administered medications for this visit.    Allergies:   Review of patient's allergies indicates no known allergies.    Social History:  The patient  reports that he has never smoked. He has never used smokeless tobacco. He reports that he does not drink alcohol or use illicit drugs.   Family History:  The patient's family history includes Cancer - Lung in his brother; Heart attack in his mother; Heart disease in his brother; Liver cancer in his brother.    ROS:  Please see the history of present illness.   Otherwise, review of systems are positive for none.   All other systems are reviewed and negative.    PHYSICAL EXAM: VS:   BP 128/74 mmHg  Pulse 60  Ht 6\' 1"  (1.854 m)  Wt 231 lb 6.4 oz (104.962 kg)  BMI 30.54 kg/m2 , BMI Body mass index is 30.54 kg/(m^2). GEN: Well nourished, well developed, in no acute distress HEENT: normal Neck: no JVD, carotid bruits, or masses Cardiac: RRR; no murmurs, rubs, or gallops,no edema  Respiratory:  clear to auscultation bilaterally, normal work of breathing GI: soft, nontender, nondistended, + BS MS: no deformity or atrophy Skin: warm and dry, no rash Neuro:  Strength and sensation are intact Psych: euthymic mood, full affect   EKG:  EKG is not ordered today.    Recent Labs: 07/09/2015: ALT 27    Lipid Panel    Component Value Date/Time   CHOL 101* 07/09/2015 0824   TRIG 99 07/09/2015 0824   HDL 39* 07/09/2015 0824   CHOLHDL 2.6 07/09/2015 0824   VLDL 20 07/09/2015 0824   LDLCALC 42 07/09/2015 0824      Wt Readings from Last 3 Encounters:  09/24/15 231 lb 6.4 oz (104.962 kg)  07/05/15 229 lb (103.874 kg)  02/08/15 227 lb 3.2 oz (103.057 kg)       ASSESSMENT AND PLAN:  1. Mild to moderate OSA with an AHI of 14/hr now on CPAP and doing well. Patient has been using and benefiting from CPAP use and will continue to benefit from therapy. His d/l today showed an AHI of 0.8/hr on CPAP at 14cm H2O with 90% compliance in using more than 4 hours nightly. He will continue on current settings. 2. HTN - controlled on Cozaar 3. Obesity - I encouraged him to try to get out walking some to drop some weight. He has slowly gained weight over the past 2 years.     Current medicines are reviewed at length with the patient today.  The patient does not have concerns regarding medicines.  The following changes have been made:  no change  Labs/ tests ordered today: See above Assessment and Plan No orders of the defined types were placed in this encounter.     Disposition:   FU with me in 1 year  Signed, 02/10/15, MD  09/24/2015 10:57 AM    [facility]  [facility] [address] [place], [place], [place]  [place] Phone: [phone]; Fax: [phone]

## 2015-09-24 ENCOUNTER — Encounter: Payer: Self-pay | Admitting: Cardiology

## 2015-09-24 ENCOUNTER — Ambulatory Visit (INDEPENDENT_AMBULATORY_CARE_PROVIDER_SITE_OTHER): Payer: Medicare Other | Admitting: Cardiology

## 2015-09-24 ENCOUNTER — Encounter: Payer: Self-pay | Admitting: Internal Medicine

## 2015-09-24 VITALS — BP 128/74 | HR 60 | Ht 73.0 in | Wt 231.4 lb

## 2015-09-24 DIAGNOSIS — E669 Obesity, unspecified: Secondary | ICD-10-CM

## 2015-09-24 DIAGNOSIS — G4733 Obstructive sleep apnea (adult) (pediatric): Secondary | ICD-10-CM | POA: Diagnosis not present

## 2015-09-24 DIAGNOSIS — I10 Essential (primary) hypertension: Secondary | ICD-10-CM

## 2015-09-24 NOTE — Patient Instructions (Signed)

## 2015-09-30 ENCOUNTER — Encounter: Payer: Self-pay | Admitting: Cardiology

## 2015-10-02 LAB — CUP PACEART REMOTE DEVICE CHECK: Date Time Interrogation Session: 20170212183927

## 2015-10-02 NOTE — Progress Notes (Signed)
Carelink summary report received. Battery status OK. Normal device function. No new symptom episodes, tachy episodes, brady, or pause episodes. No new AF episodes. Monthly summary reports and ROV/PRN 

## 2015-10-03 LAB — CUP PACEART REMOTE DEVICE CHECK: MDC IDC SESS DTM: 20170113183739

## 2015-10-03 NOTE — Progress Notes (Signed)
Carelink summary report received. Battery status OK. Normal device function. No new symptom episodes, tachy episodes, or brady. No new AF episodes. 4 pause episodes- 3 with ECGs appear heart block- occurring at 2349-0606, appear to be previously addressed/sleep apnea. To JA for review. Monthly summary reports and ROV/PRN

## 2015-10-08 ENCOUNTER — Ambulatory Visit (INDEPENDENT_AMBULATORY_CARE_PROVIDER_SITE_OTHER): Payer: Medicare Other | Admitting: *Deleted

## 2015-10-08 DIAGNOSIS — I638 Other cerebral infarction: Secondary | ICD-10-CM | POA: Diagnosis not present

## 2015-10-08 DIAGNOSIS — I6389 Other cerebral infarction: Secondary | ICD-10-CM

## 2015-10-09 NOTE — Progress Notes (Signed)
Carelink Summary Report / Loop Recorder 

## 2015-11-07 ENCOUNTER — Ambulatory Visit (INDEPENDENT_AMBULATORY_CARE_PROVIDER_SITE_OTHER): Payer: Medicare Other | Admitting: *Deleted

## 2015-11-07 DIAGNOSIS — I6389 Other cerebral infarction: Secondary | ICD-10-CM

## 2015-11-07 DIAGNOSIS — I638 Other cerebral infarction: Secondary | ICD-10-CM

## 2015-11-11 NOTE — Progress Notes (Signed)
Carelink Summary Report / Loop Recorder 

## 2015-12-05 ENCOUNTER — Encounter: Payer: Self-pay | Admitting: Internal Medicine

## 2015-12-09 ENCOUNTER — Ambulatory Visit (INDEPENDENT_AMBULATORY_CARE_PROVIDER_SITE_OTHER): Payer: Medicare Other | Admitting: *Deleted

## 2015-12-09 DIAGNOSIS — I638 Other cerebral infarction: Secondary | ICD-10-CM

## 2015-12-09 DIAGNOSIS — I6389 Other cerebral infarction: Secondary | ICD-10-CM

## 2015-12-09 NOTE — Progress Notes (Signed)
Carelink Summary Report / Loop Recorder 

## 2015-12-20 LAB — CUP PACEART REMOTE DEVICE CHECK: Date Time Interrogation Session: 20170314184039

## 2015-12-22 LAB — CUP PACEART REMOTE DEVICE CHECK: MDC IDC SESS DTM: 20170413183808

## 2015-12-22 NOTE — Progress Notes (Signed)
Carelink summary report received. Battery status OK. Normal device function. No new symptom episodes, tachy episodes, brady episodes. No new AF episodes. 1 nocturnal pause episode. Monthly summary reports and ROV/PRN

## 2016-01-06 ENCOUNTER — Ambulatory Visit (INDEPENDENT_AMBULATORY_CARE_PROVIDER_SITE_OTHER): Payer: Medicare Other | Admitting: *Deleted

## 2016-01-06 DIAGNOSIS — I6389 Other cerebral infarction: Secondary | ICD-10-CM

## 2016-01-06 DIAGNOSIS — I638 Other cerebral infarction: Secondary | ICD-10-CM | POA: Diagnosis not present

## 2016-01-07 NOTE — Progress Notes (Signed)
Carelink Summary Report / Loop Recorder 

## 2016-01-14 LAB — CUP PACEART REMOTE DEVICE CHECK: Date Time Interrogation Session: 20170513183937

## 2016-02-05 ENCOUNTER — Ambulatory Visit (INDEPENDENT_AMBULATORY_CARE_PROVIDER_SITE_OTHER): Payer: Medicare Other | Admitting: *Deleted

## 2016-02-05 DIAGNOSIS — I638 Other cerebral infarction: Secondary | ICD-10-CM | POA: Diagnosis not present

## 2016-02-05 DIAGNOSIS — I6389 Other cerebral infarction: Secondary | ICD-10-CM

## 2016-02-05 NOTE — Progress Notes (Signed)
Carelink Summary Report / Loop Recorder 

## 2016-02-07 LAB — CUP PACEART REMOTE DEVICE CHECK: MDC IDC SESS DTM: 20170612191113

## 2016-03-06 ENCOUNTER — Ambulatory Visit (INDEPENDENT_AMBULATORY_CARE_PROVIDER_SITE_OTHER): Payer: Medicare Other | Admitting: *Deleted

## 2016-03-06 DIAGNOSIS — I6389 Other cerebral infarction: Secondary | ICD-10-CM

## 2016-03-06 DIAGNOSIS — I638 Other cerebral infarction: Secondary | ICD-10-CM | POA: Diagnosis not present

## 2016-03-09 NOTE — Progress Notes (Signed)
Carelink Summary Report / Loop Recorder 

## 2016-03-12 LAB — CUP PACEART REMOTE DEVICE CHECK: Date Time Interrogation Session: 20170712200541

## 2016-03-24 ENCOUNTER — Telehealth: Payer: Self-pay | Admitting: *Deleted

## 2016-03-24 NOTE — Telephone Encounter (Signed)
Called patient to request that he send a manual transmission for software update (for false RRT).  Assisted patient in sending transmission, successfully received and software updated.  Patient appreciative of call and denies additional questions or concerns at this time.

## 2016-04-04 LAB — CUP PACEART REMOTE DEVICE CHECK: Date Time Interrogation Session: 20170811203635

## 2016-04-06 ENCOUNTER — Ambulatory Visit (INDEPENDENT_AMBULATORY_CARE_PROVIDER_SITE_OTHER): Payer: Medicare Other | Admitting: *Deleted

## 2016-04-06 DIAGNOSIS — I638 Other cerebral infarction: Secondary | ICD-10-CM

## 2016-04-06 DIAGNOSIS — I6389 Other cerebral infarction: Secondary | ICD-10-CM

## 2016-04-06 NOTE — Progress Notes (Signed)
Carelink Summary Report / Loop Recorder 

## 2016-05-02 LAB — CUP PACEART REMOTE DEVICE CHECK: Date Time Interrogation Session: 20170910203700

## 2016-05-02 NOTE — Progress Notes (Signed)
Carelink summary report received. Battery status OK. Normal device function. No new symptom episodes, tachy episodes. No new AF episodes. 3 pause and 3 brady episodes, all nocturnal, chronic for patient.  Monthly summary reports and ROV/PRN

## 2016-05-05 ENCOUNTER — Ambulatory Visit (INDEPENDENT_AMBULATORY_CARE_PROVIDER_SITE_OTHER): Payer: Medicare Other | Admitting: *Deleted

## 2016-05-05 DIAGNOSIS — I6389 Other cerebral infarction: Secondary | ICD-10-CM

## 2016-05-05 DIAGNOSIS — I638 Other cerebral infarction: Secondary | ICD-10-CM | POA: Diagnosis not present

## 2016-05-06 NOTE — Progress Notes (Signed)
Carelink Summary Report / Loop Recorder 

## 2016-06-04 ENCOUNTER — Ambulatory Visit (INDEPENDENT_AMBULATORY_CARE_PROVIDER_SITE_OTHER): Payer: Medicare Other | Admitting: *Deleted

## 2016-06-04 DIAGNOSIS — I638 Other cerebral infarction: Secondary | ICD-10-CM

## 2016-06-04 DIAGNOSIS — I6389 Other cerebral infarction: Secondary | ICD-10-CM

## 2016-06-05 NOTE — Progress Notes (Signed)
Carelink Summary Report / Loop Recorder 

## 2016-06-07 LAB — CUP PACEART REMOTE DEVICE CHECK
Implantable Pulse Generator Implant Date: 20150521
MDC IDC SESS DTM: 20171010213854

## 2016-06-07 NOTE — Progress Notes (Signed)
Carelink summary report received. Battery status OK. Normal device function. No new symptom episodes, tachy episodes, brady episodes. No new AF episodes. 1 nocturnal pause episode. Monthly summary reports and ROV/PRN 

## 2016-06-29 ENCOUNTER — Other Ambulatory Visit: Payer: Self-pay | Admitting: Cardiovascular Disease

## 2016-06-29 DIAGNOSIS — R0683 Snoring: Secondary | ICD-10-CM

## 2016-06-29 DIAGNOSIS — E7800 Pure hypercholesterolemia, unspecified: Secondary | ICD-10-CM

## 2016-06-29 DIAGNOSIS — I10 Essential (primary) hypertension: Secondary | ICD-10-CM

## 2016-07-06 ENCOUNTER — Ambulatory Visit (INDEPENDENT_AMBULATORY_CARE_PROVIDER_SITE_OTHER): Payer: Medicare Other | Admitting: *Deleted

## 2016-07-06 DIAGNOSIS — I6389 Other cerebral infarction: Secondary | ICD-10-CM

## 2016-07-06 DIAGNOSIS — I638 Other cerebral infarction: Secondary | ICD-10-CM

## 2016-07-06 NOTE — Progress Notes (Signed)
Carelink Summary Report / Loop Recorder 

## 2016-07-08 ENCOUNTER — Ambulatory Visit (INDEPENDENT_AMBULATORY_CARE_PROVIDER_SITE_OTHER): Payer: Medicare Other | Admitting: Cardiovascular Disease

## 2016-07-08 ENCOUNTER — Encounter: Payer: Self-pay | Admitting: Cardiovascular Disease

## 2016-07-08 DIAGNOSIS — I10 Essential (primary) hypertension: Secondary | ICD-10-CM | POA: Diagnosis not present

## 2016-07-08 DIAGNOSIS — E7800 Pure hypercholesterolemia, unspecified: Secondary | ICD-10-CM | POA: Diagnosis not present

## 2016-07-08 DIAGNOSIS — R0683 Snoring: Secondary | ICD-10-CM

## 2016-07-08 DIAGNOSIS — I2102 ST elevation (STEMI) myocardial infarction involving left anterior descending coronary artery: Secondary | ICD-10-CM | POA: Diagnosis not present

## 2016-07-08 DIAGNOSIS — I639 Cerebral infarction, unspecified: Secondary | ICD-10-CM

## 2016-07-08 DIAGNOSIS — G4733 Obstructive sleep apnea (adult) (pediatric): Secondary | ICD-10-CM

## 2016-07-08 MED ORDER — LOSARTAN POTASSIUM 50 MG PO TABS: 50.0000 mg | ORAL_TABLET | Freq: Every day | ORAL | 3 refills | Status: DC

## 2016-07-08 MED ORDER — CLOPIDOGREL BISULFATE 75 MG PO TABS: 75.0000 mg | ORAL_TABLET | Freq: Every day | ORAL | 3 refills | Status: DC

## 2016-07-08 MED ORDER — ROSUVASTATIN CALCIUM 20 MG PO TABS: 20.0000 mg | ORAL_TABLET | Freq: Every day | ORAL | 3 refills | Status: DC

## 2016-07-08 NOTE — Assessment & Plan Note (Signed)
History of hypertension and blood pressure measured today at 142/84 He is on losartan. Continue current meds at current dosing

## 2016-07-08 NOTE — Patient Instructions (Signed)

## 2016-07-08 NOTE — Assessment & Plan Note (Signed)
History of hyperlipidemia on statin therapy. We will recheck a lipid and liver profile 

## 2016-07-08 NOTE — Assessment & Plan Note (Signed)
History of CAD status post LAD intervention by Dr. Jorje Guild 2001 in the setting of a myocardial infarction. I catheterized him in 2004 revealing a patent stent and anteroapical wall motion abnormality and EF 45-50% and mildly performed October 2011 showed apical scar and echo showed normal EF. He denies chest pain or shortness of breath.

## 2016-07-08 NOTE — Assessment & Plan Note (Signed)
History of prior stroke and multiple vascular territories. He had a loop recorder implanted followed by Dr. Johney Frame without evidence of PAF. He is on dual antiplatelet therapy.

## 2016-07-08 NOTE — Assessment & Plan Note (Signed)
History of obstructive sleep apnea on C Pap followed by Dr. Armanda Magic.

## 2016-07-08 NOTE — Progress Notes (Signed)
07/08/2016 Brandon Soto   1945-11-19  [id number]  Primary Physician [name], MD Primary Cardiologist: Runell Gess MD Brandon Soto  HPI:  The patient is a very pleasant 70 year old, mildly overweight, married, Caucasian male father of 2, grandfather to 3 grandchildren who I last saw in the office 07/05/15.[name] He has a history of CAD status post LAD intervention by Dr. Jorje Guild in 2001 in the setting of a myocardial infarction. I catheterization him in 2004 revealing a patent stent with an anteroapical wall motion abnormality and EF of 45-50%. His other problems include hypertension, hyperlipidemia, and hypothyroidism. He denies chest pain or shortness of breath. His last Myoview performed in October of 2011 showed apical scar, and echo showed a normal EF. His most recent lab work revealed a total cholesterol of 146, LDL of 72, and HDL 49.he had a left total knee replacement performed by Dr. Hadassah Pais 06/27/12 which was uncomplicated. He denies chest pain or shortness of breath. His primary care physician, Rita Ohara, follows his lipid profile closely. He had a recent stroke apparently in 2 vascular territories. Workup has been unrevealing including carotid Dopplers which were essentially normal and a transthoracic echo that did not show an embolic source. Dr. Hillis Range placed a loop recorder to rule out paroxysmal atrial fibrillation as a cause. He was placed on Plavix. He has not had any arrhythmias suggesting an arrhythmogenic cause.Marland KitchenHe also has had a sleep study and has been placed on C Pap followed by Dr. Mayford Knife.   Current Outpatient Prescriptions  Medication Sig Dispense Refill  . acetaminophen (TYLENOL) 325 MG tablet Take 650 mg by mouth every 6 (six) hours as needed for mild pain. For pain in hands    . clopidogrel (PLAVIX) 75 MG tablet Take 1 tablet (75 mg total) by mouth daily. 90 tablet 3  . levothyroxine (SYNTHROID, LEVOTHROID) 175 MCG tablet Take 175 mcg by  mouth daily before breakfast.    . losartan (COZAAR) 50 MG tablet Take 1 tablet (50 mg total) by mouth daily. 90 tablet 3  . Multiple Vitamin (MULTIVITAMIN WITH MINERALS) TABS Take 1 tablet by mouth daily.    [name] omega-3 acid ethyl esters (LOVAZA) 1 G capsule Take 2 g by mouth daily. Taking the [facility] brand    . rosuvastatin (CRESTOR) 20 MG tablet Take 1 tablet (20 mg total) by mouth at bedtime. 90 tablet 3   No current facility-administered medications for this visit.     No Known Allergies  Social History   Social History  . Marital status: Married    Spouse name: N/A  . Number of children: 2  . Years of education: N/A   Occupational History  . retired    Social History Main Topics  . Smoking status: Never Smoker  . Smokeless tobacco: Never Used  . Alcohol use No  . Drug use: No  . Sexual activity: Yes   Other Topics Concern  . Not on file   Social History Narrative  . No narrative on file     Review of Systems: General: negative for chills, fever, night sweats or weight changes.  Cardiovascular: negative for chest pain, dyspnea on exertion, edema, orthopnea, palpitations, paroxysmal nocturnal dyspnea or shortness of breath Dermatological: negative for rash Respiratory: negative for cough or wheezing Urologic: negative for hematuria Abdominal: negative for nausea, vomiting, diarrhea, bright red blood per rectum, melena, or hematemesis Neurologic: negative for visual changes, syncope, or dizziness All other systems reviewed and  are otherwise negative except as noted above.    Blood pressure (!) 142/84, pulse 60, height 6\' 1"  (1.854 m), weight 237 lb 9.6 oz (107.8 kg).  General appearance: alert and no distress Neck: no adenopathy, no carotid bruit, no JVD, supple, symmetrical, trachea midline and thyroid not enlarged, symmetric, no tenderness/mass/nodules Lungs: clear to auscultation bilaterally Heart: regular rate and rhythm, S1, S2 normal, no murmur, click, rub or  gallop Extremities: extremities normal, atraumatic, no cyanosis or edema  EKG sinus rhythm at 60 with septal Q waves. I personally reviewed this EKG.  ASSESSMENT AND PLAN:   Hypertension History of hypertension and blood pressure measured today at 142/84 He is on losartan. Continue current meds at current dosing  Hypercholesteremia History of hyperlipidemia on statin therapy. We will recheck a lipid and liver profile  Heart attack History of CAD status post LAD intervention by Dr. 2001 in the setting of a myocardial infarction. I catheterized him in 2004 revealing a patent stent and anteroapical wall motion abnormality and EF 45-50% and mildly performed October 2011 showed apical scar and echo showed normal EF. He denies chest pain or shortness of breath.  Acute ischemic stroke [facility]) History of prior stroke and multiple vascular territories. He had a loop recorder implanted followed by [facility] without evidence of PAF. He is on dual antiplatelet therapy.  OSA (obstructive sleep apnea) History of obstructive sleep apnea on C Pap followed by Dr. Johney Frame.      Armanda Magic MD FACP,FACC,FAHA, [facility] 07/08/2016 11:56 AM

## 2016-07-22 LAB — CUP PACEART REMOTE DEVICE CHECK
Date Time Interrogation Session: 20171109223838
MDC IDC PG IMPLANT DT: 20150521

## 2016-08-03 ENCOUNTER — Ambulatory Visit (INDEPENDENT_AMBULATORY_CARE_PROVIDER_SITE_OTHER): Payer: Medicare Other | Admitting: *Deleted

## 2016-08-03 DIAGNOSIS — I639 Cerebral infarction, unspecified: Secondary | ICD-10-CM

## 2016-08-04 NOTE — Progress Notes (Signed)
Carelink Summary Report / Loop Recorder 

## 2016-08-20 ENCOUNTER — Telehealth: Payer: Self-pay | Admitting: *Deleted

## 2016-08-20 ENCOUNTER — Encounter (HOSPITAL_COMMUNITY): Payer: Self-pay | Admitting: Nurse Practitioner

## 2016-08-20 ENCOUNTER — Ambulatory Visit (HOSPITAL_COMMUNITY)
Admission: RE | Admit: 2016-08-20 | Discharge: 2016-08-20 | Disposition: A | Discharge status: Home / Self Care | Payer: Medicare Other | Source: Ambulatory Visit | Attending: Nurse Practitioner | Admitting: Nurse Practitioner

## 2016-08-20 VITALS — BP 140/80 | HR 61 | Ht 73.0 in | Wt 242.6 lb

## 2016-08-20 DIAGNOSIS — I480 Paroxysmal atrial fibrillation: Secondary | ICD-10-CM | POA: Diagnosis not present

## 2016-08-20 DIAGNOSIS — I4891 Unspecified atrial fibrillation: Secondary | ICD-10-CM | POA: Insufficient documentation

## 2016-08-20 DIAGNOSIS — R9431 Abnormal electrocardiogram [ECG] [EKG]: Secondary | ICD-10-CM | POA: Diagnosis not present

## 2016-08-20 MED ORDER — RIVAROXABAN 20 MG PO TABS: 20.0000 mg | ORAL_TABLET | Freq: Every day | ORAL | 0 refills | Status: DC

## 2016-08-20 NOTE — Telephone Encounter (Signed)
Spoke with patient regarding new A-fib on LINQ.  Episode occurred on 08/19/16 at 0705 and transmitted overnight.  Episode reviewed by Dr. Johney Frame, who agreed that it shows AF.  Per Dr. Johney Frame, patient to be scheduled for appointment at the AF Clinic today to discuss OAC.  AF Clinic to reach out to patient to schedule appointment time.  Patient is appreciative of assistance and denies additional questions or concerns at this time.  Per upcoming appointments, patient scheduled for appointment today at 2:45pm.

## 2016-08-20 NOTE — Patient Instructions (Signed)
Your physician has recommended you make the following change in your medication:  1)Start Xarelto 20mg  once a day with supper 2)Stop Plavix

## 2016-08-21 NOTE — Progress Notes (Addendum)
Primary Care Physician: [name], MD Referring Physician: Dr. Loel Ro is a 71 y.o. male with a h/o CVA with Linq implanted at time of stroke May 2015. It recently was documented by loop recorder that pt have a short burst of afib 1/24 at 0705 and confirmed by Dr. Johney Frame. He requested that pt was to be seen in afib clinic to discuss anticoagulation.  Pt was unaware of the episode and denies any other heart irregularity. He has been on plavix since time of stroke and has not had any bleeding issues on this and denies bleeding tendencies. He is steady on his feet. CHA2DS2VASc score is 5.  Today, he denies symptoms of palpitations, chest pain, shortness of breath, orthopnea, PND, lower extremity edema, dizziness, presyncope, syncope, or neurologic sequela. The patient is tolerating medications without difficulties and is otherwise without complaint today.   Past Medical History:  Diagnosis Date  . BPH (benign prostatic hyperplasia)   . Coronary artery disease    s/p LAD intervention 2001  . CVA (cerebral infarction)   . Dyslipidemia   . GERD (gastroesophageal reflux disease)   . Hypertension   . Hypothyroidism    "shrank w/radiation ?1990's" (06/27/2012)  . Left knee DJD   . OSA (obstructive sleep apnea) 11/29/2014  . PONV (postoperative nausea and vomiting)   . Rheumatoid arthritis(714.0)    "all over; hands especially" (06/27/2012)   Past Surgical History:  Procedure Laterality Date  . angioplasty N/A   . CARDIAC CATHETERIZATION  ~ 2009  . CHOLECYSTECTOMY  ~ 2006  . COLONOSCOPY  06/2014  . CORONARY ANGIOPLASTY    . CORONARY ANGIOPLASTY WITH STENT PLACEMENT  2001   "1" (06/27/2012)  . 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 LOOP RECORDER IMPLANT N/A 12/15/2013   Procedure: LOOP RECORDER IMPLANT;  Surgeon: Gardiner Rhyme, MD;  Location: [facility] CATH LAB;  Service: Cardiovascular;  Laterality: N/A;  . [employer] SCREENING  MAMMO BIL/CAD ([facility] HX)    . PARTIAL KNEE ARTHROPLASTY  06/27/2012   Procedure: UNICOMPARTMENTAL KNEE;  Surgeon: Nilda Simmer, MD;  Location: [facility] OR;  Service: Orthopedics;  Laterality: Left;  left unicompartmental knee  . SHOULDER ARTHROSCOPY WITH ROTATOR CUFF REPAIR AND SUBACROMIAL DECOMPRESSION Left 2005   (06/27/2012)  . stent placement N/A   . TEE WITHOUT CARDIOVERSION N/A 01/03/2014   Procedure: TRANSESOPHAGEAL ECHOCARDIOGRAM (TEE);  Surgeon: Thurmon Fair, MD;  Location: [facility] ENDOSCOPY;  Service: Cardiovascular;  Laterality: N/A;    Current Outpatient Prescriptions  Medication Sig Dispense Refill  . levothyroxine (SYNTHROID, LEVOTHROID) 175 MCG tablet Take 175 mcg by mouth daily before breakfast.    . losartan (COZAAR) 50 MG tablet Take 1 tablet (50 mg total) by mouth daily. 90 tablet 3  . Multiple Vitamin (MULTIVITAMIN WITH MINERALS) TABS Take 1 tablet by mouth daily.    [name] omega-3 acid ethyl esters (LOVAZA) 1 G capsule Take 2 g by mouth daily. Taking the [facility] brand    . rosuvastatin (CRESTOR) 20 MG tablet Take 1 tablet (20 mg total) by mouth at bedtime. 90 tablet 3  . acetaminophen (TYLENOL) 325 MG tablet Take 650 mg by mouth every 6 (six) hours as needed for mild pain. For pain in hands    . rivaroxaban (XARELTO) 20 MG TABS tablet Take 1 tablet (20 mg total) by mouth daily with supper. 30 tablet 0   No current facility-administered medications for this encounter.  No Known Allergies  Social History   Social History  . Marital status: Married    Spouse name: N/A  . Number of children: 2  . Years of education: N/A   Occupational History  . retired    Social History Main Topics  . Smoking status: Never Smoker  . Smokeless tobacco: Never Used  . Alcohol use No  . Drug use: No  . Sexual activity: Yes   Other Topics Concern  . Not on file   Social History Narrative  . No narrative on file    Family History  Problem Relation Age of Onset  . Heart attack Mother     . Heart disease Brother   . Liver cancer Brother     deceased  . Cancer - Lung Brother     deceased    ROS- All systems are reviewed and negative except as per the HPI above  Physical Exam: Vitals:   08/20/16 1435  BP: 140/80  Pulse: 61  Weight: 242 lb 9.6 oz (110 kg)  Height: 6\' 1"  (1.854 m)   Wt Readings from Last 3 Encounters:  08/20/16 242 lb 9.6 oz (110 kg)  07/08/16 237 lb 9.6 oz (107.8 kg)  09/24/15 231 lb 6.4 oz (105 kg)    Labs: Lab Results  Component Value Date   NA 135 06/16/2014   K 4.6 06/16/2014   CL 101 06/16/2014   CO2 24 06/16/2014   GLUCOSE 122 (H) 06/16/2014   BUN 17 06/16/2014   CREATININE 0.91 06/16/2014   CALCIUM 9.8 06/16/2014   Lab Results  Component Value Date   INR 1.11 12/15/2013   Lab Results  Component Value Date   CHOL 101 (L) 07/09/2015   HDL 39 (L) 07/09/2015   LDLCALC 42 07/09/2015   TRIG 99 07/09/2015     GEN- The patient is well appearing, alert and oriented x 3 today.   Head- normocephalic, atraumatic Eyes-  Sclera clear, conjunctiva pink Ears- hearing intact Oropharynx- clear Neck- supple, no JVP Lymph- no cervical lymphadenopathy Lungs- Clear to ausculation bilaterally, normal work of breathing Heart- Regular rate and rhythm, no murmurs, rubs or gallops, PMI not laterally displaced GI- soft, NT, ND, + BS Extremities- no clubbing, cyanosis, or edema MS- no significant deformity or atrophy Skin- no rash or lesion Psych- euthymic mood, full affect Neuro- strength and sensation are intact  EKG-SR with first degree AV block with pr int at 258 ms, qrs int 80 ms, qtc 422 ms Epic records reviewed linq strip reviewed    Assessment and Plan: 1. Brief asymptomatic run of  Paroxsymal afib, seen on linq, in the setting of prior CVA Pt will stop plavix and start xarelto 20 mg a day with creat cl cal at 131.49 based on bmet form [facility] 10/17. Risk vrs  benefit of available anticoagulants discussed in detail,  and bleeding  precautions discussed Pt denies any bleeding tendencies or history  F/u in one month with CBC  11/17 C. [facility] [address] [place], [place] [phone]

## 2016-08-25 LAB — CUP PACEART REMOTE DEVICE CHECK
Date Time Interrogation Session: 20171209231611
MDC IDC PG IMPLANT DT: 20150521

## 2016-08-25 NOTE — Progress Notes (Signed)
Carelink summary report received. Battery status OK. Normal device function. No new symptom episodes, tachy episodes, brady, or pause episodes. No new AF episodes. Monthly summary reports and ROV/PRN 

## 2016-09-01 ENCOUNTER — Other Ambulatory Visit: Payer: Self-pay | Admitting: Internal Medicine

## 2016-09-02 ENCOUNTER — Ambulatory Visit (INDEPENDENT_AMBULATORY_CARE_PROVIDER_SITE_OTHER): Payer: Medicare Other | Admitting: *Deleted

## 2016-09-02 DIAGNOSIS — I639 Cerebral infarction, unspecified: Secondary | ICD-10-CM

## 2016-09-03 ENCOUNTER — Telehealth: Payer: Self-pay | Admitting: Cardiology

## 2016-09-03 NOTE — Telephone Encounter (Signed)
Spoke w/ pt wife and requested that pt send a remote transmission w/ his home monitor b/c it has not updated in the last 14 days. Pt wife verbalized understanding.

## 2016-09-03 NOTE — Progress Notes (Signed)
Carelink Summary Report / Loop Recorder 

## 2016-09-09 ENCOUNTER — Other Ambulatory Visit (HOSPITAL_COMMUNITY): Payer: Self-pay | Admitting: *Deleted

## 2016-09-09 MED ORDER — RIVAROXABAN 20 MG PO TABS: 20.0000 mg | ORAL_TABLET | Freq: Every day | ORAL | 0 refills | Status: DC

## 2016-09-09 NOTE — Telephone Encounter (Signed)
Pt cld to let us know that he is not sure how but he will actually not have enough xarelto to get him to his 09/21/16 appt.  I sent in 30 day supply and confirmed appt for 2/26.  If ins will not let him fill, he will call us back

## 2016-09-11 ENCOUNTER — Encounter: Payer: Self-pay | Admitting: *Deleted

## 2016-09-15 LAB — CUP PACEART REMOTE DEVICE CHECK
Date Time Interrogation Session: 20180108234151
MDC IDC PG IMPLANT DT: 20150521

## 2016-09-21 ENCOUNTER — Encounter: Payer: Self-pay | Admitting: Cardiology

## 2016-09-21 ENCOUNTER — Encounter (HOSPITAL_COMMUNITY): Payer: Self-pay | Admitting: Nurse Practitioner

## 2016-09-21 ENCOUNTER — Ambulatory Visit (HOSPITAL_COMMUNITY)
Admission: RE | Admit: 2016-09-21 | Discharge: 2016-09-21 | Disposition: A | Discharge status: Home / Self Care | Payer: Medicare Other | Source: Ambulatory Visit | Attending: Nurse Practitioner | Admitting: Nurse Practitioner

## 2016-09-21 VITALS — BP 138/84 | HR 54 | Ht 73.0 in | Wt 241.0 lb

## 2016-09-21 DIAGNOSIS — I10 Essential (primary) hypertension: Secondary | ICD-10-CM | POA: Diagnosis not present

## 2016-09-21 DIAGNOSIS — Z808 Family history of malignant neoplasm of other organs or systems: Secondary | ICD-10-CM | POA: Diagnosis not present

## 2016-09-21 DIAGNOSIS — K219 Gastro-esophageal reflux disease without esophagitis: Secondary | ICD-10-CM | POA: Diagnosis not present

## 2016-09-21 DIAGNOSIS — E785 Hyperlipidemia, unspecified: Secondary | ICD-10-CM | POA: Diagnosis not present

## 2016-09-21 DIAGNOSIS — I4891 Unspecified atrial fibrillation: Secondary | ICD-10-CM | POA: Insufficient documentation

## 2016-09-21 DIAGNOSIS — N400 Enlarged prostate without lower urinary tract symptoms: Secondary | ICD-10-CM | POA: Diagnosis not present

## 2016-09-21 DIAGNOSIS — Z7902 Long term (current) use of antithrombotics/antiplatelets: Secondary | ICD-10-CM | POA: Diagnosis not present

## 2016-09-21 DIAGNOSIS — M069 Rheumatoid arthritis, unspecified: Secondary | ICD-10-CM | POA: Diagnosis not present

## 2016-09-21 DIAGNOSIS — Z79899 Other long term (current) drug therapy: Secondary | ICD-10-CM | POA: Insufficient documentation

## 2016-09-21 DIAGNOSIS — G4733 Obstructive sleep apnea (adult) (pediatric): Secondary | ICD-10-CM | POA: Diagnosis not present

## 2016-09-21 DIAGNOSIS — Z8249 Family history of ischemic heart disease and other diseases of the circulatory system: Secondary | ICD-10-CM | POA: Insufficient documentation

## 2016-09-21 DIAGNOSIS — Z9049 Acquired absence of other specified parts of digestive tract: Secondary | ICD-10-CM | POA: Insufficient documentation

## 2016-09-21 DIAGNOSIS — Z8673 Personal history of transient ischemic attack (TIA), and cerebral infarction without residual deficits: Secondary | ICD-10-CM | POA: Diagnosis not present

## 2016-09-21 DIAGNOSIS — E039 Hypothyroidism, unspecified: Secondary | ICD-10-CM | POA: Insufficient documentation

## 2016-09-21 DIAGNOSIS — Z9889 Other specified postprocedural states: Secondary | ICD-10-CM | POA: Insufficient documentation

## 2016-09-21 DIAGNOSIS — I2510 Atherosclerotic heart disease of native coronary artery without angina pectoris: Secondary | ICD-10-CM | POA: Insufficient documentation

## 2016-09-21 DIAGNOSIS — Z7901 Long term (current) use of anticoagulants: Secondary | ICD-10-CM | POA: Diagnosis not present

## 2016-09-21 DIAGNOSIS — I480 Paroxysmal atrial fibrillation: Secondary | ICD-10-CM

## 2016-09-21 DIAGNOSIS — Z801 Family history of malignant neoplasm of trachea, bronchus and lung: Secondary | ICD-10-CM | POA: Diagnosis not present

## 2016-09-21 LAB — CBC
HEMATOCRIT: 45.5 % (ref 39.0–52.0)
HEMOGLOBIN: 15.4 g/dL (ref 13.0–17.0)
MCH: 33.4 pg (ref 26.0–34.0)
MCHC: 33.8 g/dL (ref 30.0–36.0)
MCV: 98.7 fL (ref 78.0–100.0)
Platelets: 179 10*3/uL (ref 150–400)
RBC: 4.61 MIL/uL (ref 4.22–5.81)
RDW: 12.6 % (ref 11.5–15.5)
WBC: 6 10*3/uL (ref 4.0–10.5)

## 2016-09-21 MED ORDER — RIVAROXABAN 20 MG PO TABS: 20.0000 mg | ORAL_TABLET | Freq: Every day | ORAL | 2 refills | Status: DC

## 2016-09-21 NOTE — Progress Notes (Signed)
Primary Care Physician: [name], MD Referring Physician: Dr. Loel Ro is a 71 y.o. male with a h/o CVA with Linq implanted at time of stroke May 2015. It recently was documented by loop recorder that pt have a short burst of afib 1/24 at 0705 and confirmed by Dr. Johney Frame. He requested that pt was to be seen in afib clinic to discuss anticoagulation. Pt was unaware of the episode and denies any other heart irregularity. He has been on plavix since time of stroke and has not had any bleeding issues on this and denies bleeding tendencies. He is steady on his feet. CHA2DS2VASc score is 5.  He was started on xarelto 20 mg a day and is here for 30 day f/u. He had two nosebleeds but in the setting of  URI. He was able to control quickly. No awareness of any irregular heartbeat.  Today, he denies symptoms of palpitations, chest pain, shortness of breath, orthopnea, PND, lower extremity edema, dizziness, presyncope, syncope, or neurologic sequela. The patient is tolerating medications without difficulties and is otherwise without complaint today.   Past Medical History:  Diagnosis Date  . BPH (benign prostatic hyperplasia)   . Coronary artery disease    s/p LAD intervention 2001  . CVA (cerebral infarction)   . Dyslipidemia   . GERD (gastroesophageal reflux disease)   . Hypertension   . Hypothyroidism    "shrank w/radiation ?1990's" (06/27/2012)  . Left knee DJD   . OSA (obstructive sleep apnea) 11/29/2014  . PONV (postoperative nausea and vomiting)   . Rheumatoid arthritis(714.0)    "all over; hands especially" (06/27/2012)   Past Surgical History:  Procedure Laterality Date  . angioplasty N/A   . CARDIAC CATHETERIZATION  ~ 2009  . CHOLECYSTECTOMY  ~ 2006  . COLONOSCOPY  06/2014  . CORONARY ANGIOPLASTY    . CORONARY ANGIOPLASTY WITH STENT PLACEMENT  2001   "1" (06/27/2012)  . 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 LOOP  RECORDER IMPLANT N/A 12/15/2013   Procedure: LOOP RECORDER IMPLANT;  Surgeon: Gardiner Rhyme, MD;  [facility];  Service: Cardiovascular;  Laterality: N/A;  . MM CORP SCREENING MAMMO BIL/CAD ([facility] HX)    . PARTIAL KNEE ARTHROPLASTY  06/27/2012   Procedure: UNICOMPARTMENTAL KNEE;  Surgeon: Nilda Simmer, MD;  Location: [facility] OR;  Service: Orthopedics;  Laterality: Left;  left unicompartmental knee  . SHOULDER ARTHROSCOPY WITH ROTATOR CUFF REPAIR AND SUBACROMIAL DECOMPRESSION Left 2005   (06/27/2012)  . stent placement N/A   . TEE WITHOUT CARDIOVERSION N/A 01/03/2014   Procedure: TRANSESOPHAGEAL ECHOCARDIOGRAM (TEE);  Surgeon: Thurmon Fair, MD;  Location: [facility] ENDOSCOPY;  Service: Cardiovascular;  Laterality: N/A;    Current Outpatient Prescriptions  Medication Sig Dispense Refill  . acetaminophen (TYLENOL) 325 MG tablet Take 650 mg by mouth every 6 (six) hours as needed for mild pain. For pain in hands    . levothyroxine (SYNTHROID, LEVOTHROID) 175 MCG tablet Take 175 mcg by mouth daily before breakfast.    . losartan (COZAAR) 50 MG tablet Take 1 tablet (50 mg total) by mouth daily. 90 tablet 3  . Multiple Vitamin (MULTIVITAMIN WITH MINERALS) TABS Take 1 tablet by mouth daily.    [name] omega-3 acid ethyl esters (LOVAZA) 1 G capsule Take 2 g by mouth daily. Taking the [facility] brand    . rivaroxaban (XARELTO) 20 MG TABS tablet Take 1 tablet (20 mg  total) by mouth daily with supper. 90 tablet 2  . rosuvastatin (CRESTOR) 20 MG tablet Take 1 tablet (20 mg total) by mouth at bedtime. 90 tablet 3   No current facility-administered medications for this encounter.     No Known Allergies  Social History   Social History  . Marital status: Married    Spouse name: N/A  . Number of children: 2  . Years of education: N/A   Occupational History  . retired    Social History Main Topics  . Smoking status: Never Smoker  . Smokeless tobacco: Never Used  . Alcohol use No  . Drug use: No  . Sexual  activity: Yes   Other Topics Concern  . Not on file   Social History Narrative  . No narrative on file    Family History  Problem Relation Age of Onset  . Heart attack Mother   . Heart disease Brother   . Liver cancer Brother     deceased  . Cancer - Lung Brother     deceased    ROS- All systems are reviewed and negative except as per the HPI above  Physical Exam: Vitals:   09/21/16 1053  BP: 138/84  Pulse: (!) 54  Weight: 241 lb (109.3 kg)  Height: 6\' 1"  (1.854 m)   Wt Readings from Last 3 Encounters:  09/21/16 241 lb (109.3 kg)  08/20/16 242 lb 9.6 oz (110 kg)  07/08/16 237 lb 9.6 oz (107.8 kg)    Labs: Lab Results  Component Value Date   NA 135 06/16/2014   K 4.6 06/16/2014   CL 101 06/16/2014   CO2 24 06/16/2014   GLUCOSE 122 (H) 06/16/2014   BUN 17 06/16/2014   CREATININE 0.91 06/16/2014   CALCIUM 9.8 06/16/2014   Lab Results  Component Value Date   INR 1.11 12/15/2013   Lab Results  Component Value Date   CHOL 101 (L) 07/09/2015   HDL 39 (L) 07/09/2015   LDLCALC 42 07/09/2015   TRIG 99 07/09/2015     GEN- The patient is well appearing, alert and oriented x 3 today.   Head- normocephalic, atraumatic Eyes-  Sclera clear, conjunctiva pink Ears- hearing intact Oropharynx- clear Neck- supple, no JVP Lymph- no cervical lymphadenopathy Lungs- Clear to ausculation bilaterally, normal work of breathing Heart- Regular rate and rhythm, no murmurs, rubs or gallops, PMI not laterally displaced GI- soft, NT, ND, + BS Extremities- no clubbing, cyanosis, or edema MS- no significant deformity or atrophy Skin- no rash or lesion Psych- euthymic mood, full affect Neuro- strength and sensation are intact  EKG-S brady at 54 bpm, pr int 326 ms, QRS int 80 ms, qtc 386 ms Epic records reviewed linq strip reviewed    Assessment and Plan: 1. Brief asymptomatic run of  Paroxsymal afib, seen on linq, in the setting of prior CVA Pt stopped plavix and  started xarelto 20 mg a day with creat cl cal at 131.49 based on bmet form [facility] 10/17 Doing well Risk vrs  benefit of available anticoagulants discussed in detail,  and bleeding precautions discussed Pt denies any bleeding tendencies or history CBC today  F/u with PCP as scheduled for renal  function to be evaluated per PCP visits Device clinic per remote checks, device soon to be end of 3 year battery life afib clinic as needed    11/17 C. Lupita Leash Af[facility] [address] [place], [place] [phone]

## 2016-09-22 ENCOUNTER — Encounter: Payer: Self-pay | Admitting: Cardiology

## 2016-09-23 ENCOUNTER — Encounter: Payer: Self-pay | Admitting: Cardiology

## 2016-09-23 ENCOUNTER — Ambulatory Visit (INDEPENDENT_AMBULATORY_CARE_PROVIDER_SITE_OTHER): Payer: Medicare Other | Admitting: Cardiology

## 2016-09-23 VITALS — BP 118/86 | HR 60 | Ht 73.0 in | Wt 239.0 lb

## 2016-09-23 DIAGNOSIS — G4733 Obstructive sleep apnea (adult) (pediatric): Secondary | ICD-10-CM | POA: Diagnosis not present

## 2016-09-23 DIAGNOSIS — E669 Obesity, unspecified: Secondary | ICD-10-CM

## 2016-09-23 DIAGNOSIS — I10 Essential (primary) hypertension: Secondary | ICD-10-CM

## 2016-09-23 NOTE — Progress Notes (Signed)
Cardiology Office Note    Date:  09/23/2016   ID:  Brandon Soto, DOB Aug 18, 1945, MRN [id number]  PCP:  [name], MD  Cardiologist:  Armanda Magic, MD   Chief Complaint  Patient presents with  . Sleep Apnea  . Hypertension    History of Present Illness:  Brandon Soto is a 71 y.o. male who presents for followup of mild to moderate OSA with an AHI of 14/hr. Most events occurred in REM non supine sleep with oxygen desaturations as low as 84%. He is on CPAP at 14cm H2O.He is doing well with his CPAP therapy. He tolerates his CPAP device. He tolerates the nasal pillow mask and feels the pressure is adequate.  He has no nasal congestion but does have some mouth dryness. He works a lot outside and gets exercise that way but no formal aerobic exercise. He sleeps fairly well at night.  He feels rested in the am. He occasionally takes a nap in the chair but does not have problems staying awake during the day when he needs to.       Past Medical History:  Diagnosis Date  . BPH (benign prostatic hyperplasia)   . Coronary artery disease    s/p LAD intervention 2001  . CVA (cerebral infarction)   . Dyslipidemia   . GERD (gastroesophageal reflux disease)   . Hypertension   . Hypothyroidism    "shrank w/radiation ?1990's" (06/27/2012)  . Left knee DJD   . OSA (obstructive sleep apnea) 11/29/2014  . PONV (postoperative nausea and vomiting)   . Rheumatoid arthritis(714.0)    "all over; hands especially" (06/27/2012)    Past Surgical History:  Procedure Laterality Date  . angioplasty N/A   . CARDIAC CATHETERIZATION  ~ 2009  . CHOLECYSTECTOMY  ~ 2006  . COLONOSCOPY  06/2014  . CORONARY ANGIOPLASTY    . CORONARY ANGIOPLASTY WITH STENT PLACEMENT  2001   "1" (06/27/2012)  . 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 LOOP RECORDER IMPLANT N/A 12/15/2013   Procedure: LOOP RECORDER IMPLANT;  Surgeon: Gardiner Rhyme, MD;  [facility] C[facility];   Service: Cardiovascular;  Laterality: N/A;  . MM CORP SCREENING MAMMO BIL/CAD ([facility] HX)    . PARTIAL KNEE ARTHROPLASTY  06/27/2012   Procedure: UNICOMPARTMENTAL KNEE;  Surgeon: Nilda Simmer, MD;  Location: [facility] OR;  Service: Orthopedics;  Laterality: Left;  left unicompartmental knee  . SHOULDER ARTHROSCOPY WITH ROTATOR CUFF REPAIR AND SUBACROMIAL DECOMPRESSION Left 2005   (06/27/2012)  . stent placement N/A   . TEE WITHOUT CARDIOVERSION N/A 01/03/2014   Procedure: TRANSESOPHAGEAL ECHOCARDIOGRAM (TEE);  Surgeon: Thurmon Fair, MD;  Location: [facility] ENDOSCOPY;  Service: Cardiovascular;  Laterality: N/A;    Current Medications: Current Meds  Medication Sig  . acetaminophen (TYLENOL) 325 MG tablet Take 650 mg by mouth every 6 (six) hours as needed for mild pain. For pain in hands  . levothyroxine (SYNTHROID, LEVOTHROID) 175 MCG tablet Take 175 mcg by mouth daily before breakfast.  . losartan (COZAAR) 50 MG tablet Take 1 tablet (50 mg total) by mouth daily.  . Multiple Vitamin (MULTIVITAMIN WITH MINERALS) TABS Take 1 tablet by mouth daily.  [name] omega-3 acid ethyl esters (LOVAZA) 1 G capsule Take 2 g by mouth daily. Taking the [facility] brand  . rivaroxaban (XARELTO) 20 MG TABS tablet Take 1 tablet (20 mg total) by mouth daily with supper.  . rosuvastatin (CRESTOR) 20 MG  tablet Take 1 tablet (20 mg total) by mouth at bedtime.    Allergies:   Patient has no known allergies.   Social History   Social History  . Marital status: Married    Spouse name: N/A  . Number of children: 2  . Years of education: N/A   Occupational History  . retired    Social History Main Topics  . Smoking status: Never Smoker  . Smokeless tobacco: Never Used  . Alcohol use No  . Drug use: No  . Sexual activity: Yes   Other Topics Concern  . None   Social History Narrative  . None     Family History:  The patient's family history includes Cancer - Lung in his brother; Heart attack in his mother; Heart disease in  his brother; Liver cancer in his brother.   ROS:   Please see the history of present illness.    ROS All other systems reviewed and are negative.  No flowsheet data found.     PHYSICAL EXAM:   VS:  BP 118/86   Pulse 60   Ht 6\' 1"  (1.854 m)   Wt 239 lb (108.4 kg)   BMI 31.53 kg/m    GEN: Well nourished, well developed, in no acute distress  HEENT: normal  Neck: no JVD, carotid bruits, or masses Cardiac: RRR; no murmurs, rubs, or gallops,no edema.  Intact distal pulses bilaterally.  Respiratory:  clear to auscultation bilaterally, normal work of breathing GI: soft, nontender, nondistended, + BS MS: no deformity or atrophy  Skin: warm and dry, no rash Neuro:  Alert and Oriented x 3, Strength and sensation are intact Psych: euthymic mood, full affect  Wt Readings from Last 3 Encounters:  09/23/16 239 lb (108.4 kg)  09/21/16 241 lb (109.3 kg)  08/20/16 242 lb 9.6 oz (110 kg)      Studies/Labs Reviewed:   EKG:  EKG is not ordered today.   Recent Labs: 09/21/2016: Hemoglobin 15.4; Platelets 179   Lipid Panel    Component Value Date/Time   CHOL 101 (L) 07/09/2015 0824   TRIG 99 07/09/2015 0824   HDL 39 (L) 07/09/2015 0824   CHOLHDL 2.6 07/09/2015 0824   VLDL 20 07/09/2015 0824   LDLCALC 42 07/09/2015 0824    Additional studies/ records that were reviewed today include:  CPAP download    ASSESSMENT:    1. OSA (obstructive sleep apnea)   2. Essential hypertension   3. Obesity (BMI 30-39.9)      PLAN:  In order of problems listed above:  OSA - the patient is tolerating PAP therapy well without any problems. The PAP download was reviewed today and showed an AHI of 1.3/hr on 14 cm H2O with 80% compliance in using more than 4 hours nightly.  The patient has been using and benefiting from CPAP use and will continue to benefit from therapy.  HTN - BP controlled on current meds.  Will continue on ARB. Obesity - He has gained 8lbs in the past year. I have  encouraged him to get into a routine exercise program and cut back on carbs and portions.      Medication Adjustments/Labs and Tests Ordered: Current medicines are reviewed at length with the patient today.  Concerns regarding medicines are outlined above.  Medication changes, Labs and Tests ordered today are listed in the Patient Instructions below.  There are no Patient Instructions on file for this visit.   Signed, 07/11/2015, MD  09/23/2016 10:39  AM    [facility] [place], [place], [place]  [address] Phone: [phone]; Fax: [phone]

## 2016-09-23 NOTE — Patient Instructions (Signed)

## 2016-09-27 LAB — CUP PACEART REMOTE DEVICE CHECK
Date Time Interrogation Session: 20180207233918
MDC IDC PG IMPLANT DT: 20150521

## 2016-09-27 NOTE — Progress Notes (Signed)
Carelink summary report received. Battery status OK. Normal device function. No new symptom episodes, tachy episodes, brady, or pause episodes. 1 AF 0% +xarelto. Monthly summary reports and ROV/PRN

## 2016-10-02 ENCOUNTER — Ambulatory Visit (INDEPENDENT_AMBULATORY_CARE_PROVIDER_SITE_OTHER): Payer: Medicare Other | Admitting: *Deleted

## 2016-10-02 DIAGNOSIS — I639 Cerebral infarction, unspecified: Secondary | ICD-10-CM | POA: Diagnosis not present

## 2016-10-05 ENCOUNTER — Telehealth: Payer: Self-pay | Admitting: *Deleted

## 2016-10-05 NOTE — Telephone Encounter (Signed)
Called patient regarding pause episode noted on March 9 on LINQ. Patient asymptomatic, he states that is about the time he gets up everyday so possibly was still asleep. He also states he was wearing his CPAP at this time.   Verbal instructions provided to send a manual transmission to the Device clinic in order to see all recent episodes. Patient asked about LINQ reaching RRT, stated his LINQ has not yet reached RRT but will be informed once is does and discussed the options of explant or not.

## 2016-10-06 NOTE — Progress Notes (Signed)
Carelink Summary Report / Loop Recorder 

## 2016-10-08 ENCOUNTER — Other Ambulatory Visit (HOSPITAL_COMMUNITY): Payer: Self-pay | Admitting: Nurse Practitioner

## 2016-10-15 LAB — CUP PACEART REMOTE DEVICE CHECK
Date Time Interrogation Session: 20180310004125
Implantable Pulse Generator Implant Date: 20150521

## 2016-10-20 ENCOUNTER — Other Ambulatory Visit: Payer: Self-pay | Admitting: Internal Medicine

## 2016-10-28 ENCOUNTER — Ambulatory Visit (INDEPENDENT_AMBULATORY_CARE_PROVIDER_SITE_OTHER)
Admission: RE | Admit: 2016-10-28 | Discharge: 2016-10-28 | Disposition: A | Discharge status: Home / Self Care | Payer: Medicare Other | Source: Ambulatory Visit | Attending: Internal Medicine | Admitting: Internal Medicine

## 2016-10-28 ENCOUNTER — Ambulatory Visit (INDEPENDENT_AMBULATORY_CARE_PROVIDER_SITE_OTHER): Payer: Medicare Other | Admitting: Internal Medicine

## 2016-10-28 ENCOUNTER — Other Ambulatory Visit (INDEPENDENT_AMBULATORY_CARE_PROVIDER_SITE_OTHER): Payer: Medicare Other

## 2016-10-28 ENCOUNTER — Encounter: Payer: Self-pay | Admitting: Internal Medicine

## 2016-10-28 VITALS — BP 134/76 | HR 64 | Ht 71.5 in | Wt 242.2 lb

## 2016-10-28 DIAGNOSIS — R109 Unspecified abdominal pain: Secondary | ICD-10-CM | POA: Diagnosis not present

## 2016-10-28 DIAGNOSIS — R10814 Left lower quadrant abdominal tenderness: Secondary | ICD-10-CM

## 2016-10-28 DIAGNOSIS — R12 Heartburn: Secondary | ICD-10-CM

## 2016-10-28 DIAGNOSIS — K580 Irritable bowel syndrome with diarrhea: Secondary | ICD-10-CM

## 2016-10-28 LAB — CBC WITH DIFFERENTIAL/PLATELET
BASOS ABS: 0 10*3/uL (ref 0.0–0.1)
Basophils Relative: 0.5 % (ref 0.0–3.0)
EOS ABS: 0.1 10*3/uL (ref 0.0–0.7)
Eosinophils Relative: 2.3 % (ref 0.0–5.0)
HCT: 46.2 % (ref 39.0–52.0)
Hemoglobin: 15.7 g/dL (ref 13.0–17.0)
LYMPHS ABS: 1.7 10*3/uL (ref 0.7–4.0)
Lymphocytes Relative: 33.7 % (ref 12.0–46.0)
MCHC: 34.1 g/dL (ref 30.0–36.0)
MCV: 98.1 fl (ref 78.0–100.0)
MONO ABS: 0.7 10*3/uL (ref 0.1–1.0)
MONOS PCT: 13.1 % — AB (ref 3.0–12.0)
NEUTROS ABS: 2.6 10*3/uL (ref 1.4–7.7)
NEUTROS PCT: 50.4 % (ref 43.0–77.0)
PLATELETS: 180 10*3/uL (ref 150.0–400.0)
RBC: 4.71 Mil/uL (ref 4.22–5.81)
RDW: 12.8 % (ref 11.5–15.5)
WBC: 5.2 10*3/uL (ref 4.0–10.5)

## 2016-10-28 LAB — COMPREHENSIVE METABOLIC PANEL
ALK PHOS: 47 U/L (ref 39–117)
ALT: 39 U/L (ref 0–53)
AST: 29 U/L (ref 0–37)
Albumin: 4.4 g/dL (ref 3.5–5.2)
BUN: 15 mg/dL (ref 6–23)
CO2: 30 meq/L (ref 19–32)
CREATININE: 0.96 mg/dL (ref 0.40–1.50)
Calcium: 9.8 mg/dL (ref 8.4–10.5)
Chloride: 103 mEq/L (ref 96–112)
GFR: 81.94 mL/min (ref >60.00)
GLUCOSE: 92 mg/dL (ref 70–99)
Potassium: 4.4 mEq/L (ref 3.5–5.1)
Sodium: 139 mEq/L (ref 135–145)
TOTAL PROTEIN: 7.4 g/dL (ref 6.0–8.3)
Total Bilirubin: 1 mg/dL (ref 0.2–1.2)

## 2016-10-28 NOTE — Progress Notes (Signed)
Labs ok.

## 2016-10-28 NOTE — Progress Notes (Signed)
The CT scan is ok He does not have diverticulitis I think probably muscle strain and possibly a nerve root irritation  Plan:  Metronidazole 500 mg tid x 10 d for diarrhea sxs Robaxin 1000 mg qid prn muscle relaxer - could make him sleepy so maybe just qhs but rx qid prn Prednisone 10 mg tabs 3/day x 3 d, 2/day x 3 d, 1/day x 3 d and 1/2/ day x 6 days and stop # 21 no refill  If this does not make a good enough difference and if heartburn sxs do not resolve call back

## 2016-10-28 NOTE — Progress Notes (Signed)
Brandon Soto 71 y.o. April 25, 1946 [id number]  Assessment & Plan:   Encounter Diagnoses  Name Primary?  . Left lower quadrant abdominal tenderness without rebound tenderness Yes  . Left flank pain   . Irritable bowel syndrome with diarrhea   . Heartburn    Sxs sound musculoskeletal but exam sounds like internal issue that could be diverticulitis. Not clear.  CBC, CMETToday CT ab/pelvis w/ contrast today, May BE does have diverticulitis or some other problem I think the contrasted CT will shed more light. ranitidine OTC twice a day for heartburn Further plans pending these results. Note that he did have some L5-S1 degenerative disc changes his symptoms certainly are classic for that I think his pain on the flank is higher than that.  I appreciate the opportunity to care for this patient. CC: Brandon Balls, MD   Subjective:   Chief Complaint: Left flank pain  HPI The patient is a pleasant elderly white man here with his wife because of left flank pain. Problems started in the fall of 2017 he saw primary care and a CT urogram was performed and was negative for any particular problems. He was treated with metronidazole and Cipro for presumptive diverticulitis though he did not seem to have that on CT scan. He has responded to chronic diarrhea symptoms by treating with antibiotics in the past. He had been taking a probiotic. He says his bowel habits aren't really different, and he had some improvement of symptoms after that though it never really went away. Things have intensified after [place] work [place] [facility] [place] over the past weekend. He will awaken at night and has to walk around before he can have relief of pain. No urinary symptoms other than chronic hesitancy and small or reduced stream. There is no rectal bleeding. Last colonoscopy 2015 diverticulosis otherwise normal including random biopsies and terminal ileoscopy, he was treated with Xifaxan after that  but he does not provide a good history as to whether that made a significant difference.  He is having increasing heartburn in the last few days. Eating more Tums.  No Known Allergies Current Meds  Medication Sig  . acetaminophen (TYLENOL) 325 MG tablet Take 650 mg by mouth every 6 (six) hours as needed for mild pain. For pain in hands  . levothyroxine (SYNTHROID, LEVOTHROID) 175 MCG tablet Take 175 mcg by mouth daily before breakfast.  . losartan (COZAAR) 50 MG tablet Take 1 tablet (50 mg total) by mouth daily.  . Multiple Vitamin (MULTIVITAMIN WITH MINERALS) TABS Take 1 tablet by mouth daily.  [name] omega-3 acid ethyl esters (LOVAZA) 1 G capsule Take 2 g by mouth daily. Taking the [facility] brand  . rosuvastatin (CRESTOR) 20 MG tablet Take 1 tablet (20 mg total) by mouth at bedtime.  Carlena Hurl 20 MG TABS tablet TAKE 1 TABLET BY MOUTH DAILY WITH SUPPER   Past Medical History:  Diagnosis Date  . BPH (benign prostatic hyperplasia)   . Coronary artery disease    s/p LAD intervention 2001  . CVA (cerebral infarction)   . Dyslipidemia   . GERD (gastroesophageal reflux disease)   . Hypertension   . Hypothyroidism    "shrank w/radiation ?1990's" (06/27/2012)  . Left knee DJD   . OSA (obstructive sleep apnea) 11/29/2014  . PONV (postoperative nausea and vomiting)   . Rheumatoid arthritis(714.0)    "all over; hands especially" (06/27/2012)   Past Surgical History:  Procedure Laterality Date  . angioplasty N/A   .  CARDIAC CATHETERIZATION  ~ 2009  . CHOLECYSTECTOMY  ~ 2006  . COLONOSCOPY  06/2014  . CORONARY ANGIOPLASTY    . CORONARY ANGIOPLASTY WITH STENT PLACEMENT  2001   "1" (06/27/2012)  . 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 LOOP RECORDER IMPLANT N/A 12/15/2013   Procedure: LOOP RECORDER IMPLANT;  Surgeon: Gardiner Rhyme, MD;  [facility]: [facility] CATH LAB;  Service: Cardiovascular;  Laterality: N/A;  . MM CORP SCREENING MAMMO BIL/CAD ([facility] HX)      . PARTIAL KNEE ARTHROPLASTY  06/27/2012   Procedure: UNICOMPARTMENTAL KNEE;  Surgeon: Nilda Simmer, MD;  Location: [facility] OR;  Service: Orthopedics;  Laterality: Left;  left unicompartmental knee  . SHOULDER ARTHROSCOPY WITH ROTATOR CUFF REPAIR AND SUBACROMIAL DECOMPRESSION Left 2005   (06/27/2012)  . stent placement N/A   . TEE WITHOUT CARDIOVERSION N/A 01/03/2014   Procedure: TRANSESOPHAGEAL ECHOCARDIOGRAM (TEE);  Surgeon: Thurmon Fair, MD;  Location: [facility] ENDOSCOPY;  Service: Cardiovascular;  Laterality: N/A;   Social History   Social History  . Marital status: Married    Spouse name: N/A  . Number of children: 2  . Years of education: N/A   Occupational History  . retired    Social History Main Topics  . Smoking status: Never Smoker  . Smokeless tobacco: Never Used  . Alcohol use No  . Drug use: No  . Sexual activity: Yes   Other Topics Concern  . None   Social History Narrative  . None   family history includes Cancer - Lung in his brother; Heart attack in his mother; Heart disease in his brother; Liver cancer in his brother.  Review of Systems As above, no  pain numbness or tingling in the buttocks or lower extremity  Objective:   Physical Exam BP 134/76 (BP Location: Left Arm, Patient Position: Sitting, Cuff Size: Normal)   Pulse 64 Comment: irregular  Ht 5' 11.5" (1.816 m)   Wt 242 lb 4 oz (109.9 kg)   BMI 33.32 kg/m  Eyes anicteric Back nontender, no CVAT abd BS +, soft, tender LLQ w/ involuntary guarding, no mass Appropriate mood and affect, alert and oriented x 3  Data reviewed 11/17 PP notes, CT scan abd/pelvis w/o contrast, 2015 GI note and colonoscopy and path

## 2016-10-28 NOTE — Patient Instructions (Addendum)
Your physician has requested that you go to the basement for the following lab work before leaving today: CBC/diff, CMET   You have been scheduled for a CT scan of the abdomen and pelvis at [place] ([address]---this is in the same building as Press photographer).   You are scheduled on 10/28/16 at 11:30AM. You should arrive 15 minutes prior to your appointment time for registration. Please follow the written instructions below on the day of your exam:  WARNING: IF YOU ARE ALLERGIC TO IODINE/X-RAY DYE, PLEASE NOTIFY RADIOLOGY IMMEDIATELY AT [phone]! YOU WILL BE GIVEN A 13 HOUR PREMEDICATION PREP.  1) Do not eat or drink anything after 7:30AM (4 hours prior to your test) 2) You have been given 2 bottles of oral contrast to drink. The solution may taste   better if refrigerated, but do NOT add ice or any other liquid to this solution. Shake  well before drinking.    Drink 1 bottle of contrast @ 9:30AM (2 hours prior to your exam)  Drink 1 bottle of contrast @ 10:30AM (1 hour prior to your exam)  You may take any medications as prescribed with a small amount of water except for the following: Metformin, Glucophage, Glucovance, Avandamet, Riomet, Fortamet, Actoplus Met, Janumet, Glumetza or Metaglip. The above medications must be held the day of the exam AND 48 hours after the exam.  The purpose of you drinking the oral contrast is to aid in the visualization of your intestinal tract. The contrast solution may cause some diarrhea. Before your exam is started, you will be given a small amount of fluid to drink. Depending on your individual set of symptoms, you may also receive an intravenous injection of x-ray contrast/dye. Plan on being at [facility] for 30 minutes or longer, depending on the type of exam you are having performed.  This test typically takes 30-45 minutes to complete.  If you have any questions regarding your exam or if you need to reschedule, you  may call the CT department at [phone] between the hours of 8:00 am and 5:00 pm, Monday-Friday.  ________________________________________________________________________  Please purchase over the counter zantac 1110m and take one twice a day.  I appreciate the opportunity to care for you. CSilvano Rusk MD, [facility]

## 2016-10-29 ENCOUNTER — Other Ambulatory Visit: Payer: Self-pay

## 2016-10-29 MED ORDER — PREDNISONE 10 MG PO TABS: ORAL_TABLET | ORAL | 0 refills | Status: DC

## 2016-10-29 MED ORDER — METRONIDAZOLE 500 MG PO TABS: 500.0000 mg | ORAL_TABLET | Freq: Three times a day (TID) | ORAL | 0 refills | Status: DC

## 2016-10-29 MED ORDER — METHOCARBAMOL 750 MG PO TABS: ORAL_TABLET | ORAL | 1 refills | Status: DC

## 2016-11-02 ENCOUNTER — Ambulatory Visit (INDEPENDENT_AMBULATORY_CARE_PROVIDER_SITE_OTHER): Payer: Medicare Other | Admitting: *Deleted

## 2016-11-02 DIAGNOSIS — I639 Cerebral infarction, unspecified: Secondary | ICD-10-CM

## 2016-11-02 NOTE — Progress Notes (Signed)
Carelink Summary Report / Loop Recorder 

## 2016-11-13 LAB — CUP PACEART REMOTE DEVICE CHECK
Implantable Pulse Generator Implant Date: 20150521
MDC IDC SESS DTM: 20180409014126

## 2016-11-19 ENCOUNTER — Telehealth: Payer: Self-pay | Admitting: *Deleted

## 2016-11-19 NOTE — Telephone Encounter (Signed)
Mr. Devine returning call. He reports that he was asleep at the time of the episode and was wearing his CPAP. He was made aware that we will continue to monitor.

## 2016-11-19 NOTE — Telephone Encounter (Signed)
LMOVM regarding 14 second nocturnal pause on LINQ November 18, 2016 at 01:31. Gave device clinic number to call back.

## 2016-12-01 ENCOUNTER — Ambulatory Visit (INDEPENDENT_AMBULATORY_CARE_PROVIDER_SITE_OTHER): Payer: Medicare Other | Admitting: *Deleted

## 2016-12-01 DIAGNOSIS — I639 Cerebral infarction, unspecified: Secondary | ICD-10-CM

## 2016-12-02 NOTE — Progress Notes (Signed)
Carelink Summary Report / Loop Recorder 

## 2016-12-11 LAB — CUP PACEART REMOTE DEVICE CHECK
Date Time Interrogation Session: 20180509014911
MDC IDC PG IMPLANT DT: 20150521

## 2016-12-31 ENCOUNTER — Ambulatory Visit (INDEPENDENT_AMBULATORY_CARE_PROVIDER_SITE_OTHER): Payer: Medicare Other | Admitting: *Deleted

## 2016-12-31 DIAGNOSIS — I638 Other cerebral infarction: Secondary | ICD-10-CM

## 2016-12-31 DIAGNOSIS — I6389 Other cerebral infarction: Secondary | ICD-10-CM

## 2017-01-01 NOTE — Progress Notes (Signed)
Carelink Summary Report / Loop Recorder 

## 2017-01-08 ENCOUNTER — Telehealth: Payer: Self-pay | Admitting: *Deleted

## 2017-01-08 NOTE — Telephone Encounter (Signed)
LMOVM regarding LINQ at RRT since January 01, 2017. Gave device clinic phone number to call back to discuss next steps.

## 2017-01-11 LAB — CUP PACEART REMOTE DEVICE CHECK
Date Time Interrogation Session: 20180608021050
MDC IDC PG IMPLANT DT: 20150521

## 2017-01-11 NOTE — Telephone Encounter (Signed)
Patient returned call.  Advised that ILR is nearing EOS.  Patient declines appointment to discuss explant at this time.  Unenrolled from [place], return kit ordered.  Patient agrees to call our office if he wishes to schedule appointment to discuss explant in the future.  He denies additional questions or concerns at this time.

## 2017-01-11 NOTE — Progress Notes (Signed)
Carelink summary report received. Battery status OK. Normal device function. No new symptom episodes, tachy episodes, or brady episodes. No new AF episodes. 4 pause- 3 w/ ECG longest 5 sec's, all on list are nocturnal, appear transient HB (known hx) no symptoms reported per EPIC. Monthly summary reports and ROV/PRN

## 2017-03-10 ENCOUNTER — Other Ambulatory Visit: Payer: Self-pay | Admitting: Internal Medicine

## 2017-04-30 ENCOUNTER — Other Ambulatory Visit (HOSPITAL_COMMUNITY): Payer: Self-pay | Admitting: Nurse Practitioner

## 2017-07-14 ENCOUNTER — Ambulatory Visit: Payer: Medicare Other | Admitting: Cardiovascular Disease

## 2017-07-14 ENCOUNTER — Encounter: Payer: Self-pay | Admitting: Cardiovascular Disease

## 2017-07-14 VITALS — BP 136/68 | HR 51 | Ht 73.0 in | Wt 241.6 lb

## 2017-07-14 DIAGNOSIS — I480 Paroxysmal atrial fibrillation: Secondary | ICD-10-CM

## 2017-07-14 DIAGNOSIS — I10 Essential (primary) hypertension: Secondary | ICD-10-CM | POA: Diagnosis not present

## 2017-07-14 DIAGNOSIS — E7800 Pure hypercholesterolemia, unspecified: Secondary | ICD-10-CM

## 2017-07-14 DIAGNOSIS — I441 Atrioventricular block, second degree: Secondary | ICD-10-CM | POA: Diagnosis not present

## 2017-07-14 DIAGNOSIS — I2101 ST elevation (STEMI) myocardial infarction involving left main coronary artery: Secondary | ICD-10-CM

## 2017-07-14 DIAGNOSIS — G4733 Obstructive sleep apnea (adult) (pediatric): Secondary | ICD-10-CM | POA: Diagnosis not present

## 2017-07-14 NOTE — Assessment & Plan Note (Signed)
History of obstructive sleep apnea on CPAP which benefits from. 

## 2017-07-14 NOTE — Assessment & Plan Note (Signed)
History of CAD status post LAD intervention by Dr. Agustina Caroli in 2001 the setting of a myocardial infarction. I catheterized him in 2004 revealing a patent stent in the anteroapical wall motion outer malleolus of the EF of 45-50%. His last Myoview performed October 2011 showed apical scar and an echo showed normal EF. He really denies chest pain or shortness of breath.

## 2017-07-14 NOTE — Assessment & Plan Note (Signed)
History of hyperlipidemia on statin therapy followed by his PCP 

## 2017-07-14 NOTE — Assessment & Plan Note (Signed)
History of cryptogenic stroke and loop recorder which revealed short bursts of PAF with a CHA2DSVASc2 score of 5. His aspirin and Plavix were changed to Xarelto for stroke prophylaxis.

## 2017-07-14 NOTE — Progress Notes (Signed)
07/14/2017 Brandon Soto   Oct 23, 1945  [id number]  Primary Physician Wilburn Mylar, MD Primary Cardiologist: Runell Gess MD FACP, [place], [place], [place]  HPI:  Brandon Soto is a 71 y.o.  mildly overweight, married, Caucasian male father of 2, grandfather to 3 grandchildren who I last saw in the office  07/08/16.[name] He has a history of CAD status post LAD intervention by Dr. Jorje Guild in 2001 in the setting of a myocardial infarction. I catheterization him in 2004 revealing a patent stent with an anteroapical wall motion abnormality and EF of 45-50%. His other problems include hypertension, hyperlipidemia, and hypothyroidism. He denies chest pain or shortness of breath. His last Myoview performed in October of 2011 showed apical scar, and echo showed a normal EF. His most recent lab work revealed a total cholesterol of 146, LDL of 72, and HDL 49.he had a left total knee replacement performed by Dr. Hadassah Pais 06/27/12 which was uncomplicated. He denies chest pain or shortness of breath. His primary care physician, Rita Ohara, follows his lipid profile closely. He had a recent stroke apparently in 2 vascular territories. Workup has been unrevealing including carotid Dopplers which were essentially normal and a transthoracic echo that did not show an embolic source. Dr. Hillis Range placed a loop recorder to rule out paroxysmal atrial fibrillation as a cause. He was originally placed on aspirin and Plavix however the loop recorder did reveal short bursts of A. fib and he was switched to Xarelto. He also has had a sleep study and has been placed on C Pap followed by Dr. Mayford Knife. Since I saw him a year ago he denies chest pain or shortness of breath.     Current Meds  Medication Sig  . acetaminophen (TYLENOL) 325 MG tablet Take 650 mg by mouth every 6 (six) hours as needed for mild pain. For pain in hands  . levothyroxine (SYNTHROID, LEVOTHROID) 175 MCG tablet Take 175 mcg by mouth daily before  breakfast.  . losartan (COZAAR) 50 MG tablet Take 1 tablet (50 mg total) by mouth daily.  . methocarbamol (ROBAXIN-750) 750 MG tablet Please take 1 tablet q HS on a scheduled basis.  May take qid prn.  . Multiple Vitamin (MULTIVITAMIN WITH MINERALS) TABS Take 1 tablet by mouth daily.  [name] omega-3 acid ethyl esters (LOVAZA) 1 G capsule Take 2 g by mouth daily. Taking the [facility] brand  . rosuvastatin (CRESTOR) 20 MG tablet Take 1 tablet (20 mg total) by mouth at bedtime.  Carlena Hurl 20 MG TABS tablet TAKE 1 TABLET BY MOUTH DAILY WITH SUPPER     No Known Allergies  Social History   Socioeconomic History  . Marital status: Married    Spouse name: Not on file  . Number of children: 2  . Years of education: Not on file  . Highest education level: Not on file  Social Needs  . Financial resource strain: Not on file  . Food insecurity - worry: Not on file  . Food insecurity - inability: Not on file  . Transportation needs - medical: Not on file  . Transportation needs - non-medical: Not on file  Occupational History  . Occupation: retired  Tobacco Use  . Smoking status: Never Smoker  . Smokeless tobacco: Never Used  Substance and Sexual Activity  . Alcohol use: No    Alcohol/week: 0.0 oz  . Drug use: No  . Sexual activity: Yes  Other Topics Concern  . Not on  file  Social History Narrative  . Not on file     Review of Systems: General: negative for chills, fever, night sweats or weight changes.  Cardiovascular: negative for chest pain, dyspnea on exertion, edema, orthopnea, palpitations, paroxysmal nocturnal dyspnea or shortness of breath Dermatological: negative for rash Respiratory: negative for cough or wheezing Urologic: negative for hematuria Abdominal: negative for nausea, vomiting, diarrhea, bright red blood per rectum, melena, or hematemesis Neurologic: negative for visual changes, syncope, or dizziness All other systems reviewed and are otherwise negative except as noted  above.    Blood pressure 136/68, pulse (!) 51, height 6\' 1"  (1.854 m), weight 241 lb 9.6 oz (109.6 kg).  General appearance: alert and no distress Neck: no adenopathy, no carotid bruit, no JVD, supple, symmetrical, trachea midline and thyroid not enlarged, symmetric, no tenderness/mass/nodules Lungs: clear to auscultation bilaterally Heart: regular rate and rhythm, S1, S2 normal, no murmur, click, rub or gallop Extremities: extremities normal, atraumatic, no cyanosis or edema Pulses: 2+ and symmetric Skin: Skin color, texture, turgor normal. No rashes or lesions Neurologic: Alert and oriented X 3, normal strength and tone. Normal symmetric reflexes. Normal coordination and gait  EKG sinus bradycardia at 51 with second-degree AV block (Wenckebach) I personally reviewed this EKG.  ASSESSMENT AND PLAN:   Hypertension History of essential hypertension blood pressure measures at 136/68. He is on losartan. Continue current meds at current dosing  Hypercholesteremia History of hyperlipidemia on statin therapy followed by his PCP  Heart attack History of CAD status post LAD intervention by Dr. in 2001 the setting of a myocardial infarction. I catheterized him in 2004 revealing a patent stent in the anteroapical wall motion outer malleolus of the EF of 45-50%. His last Myoview performed October 2011 showed apical scar and an echo showed normal EF. He really denies chest pain or shortness of breath.  PAF (paroxysmal atrial fibrillation) (HCC) History of cryptogenic stroke and loop recorder which revealed short bursts of PAF with a CHA2DSVASc2 score of 5. His aspirin and Plavix were changed to Xarelto for stroke prophylaxis.  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  OSA (obstructive sleep apnea) History of obstructive sleep apnea on CPAP which benefits from.      November 2011 MD FACP,FACC,FAHA, [facility] 07/14/2017 10:19  AM

## 2017-07-14 NOTE — Assessment & Plan Note (Signed)
History of second-degree AV block with Wenckebach on his EKG today.

## 2017-07-14 NOTE — Assessment & Plan Note (Signed)
History of essential hypertension blood pressure measures at 136/68. He is on losartan. Continue current meds at current dosing

## 2017-07-14 NOTE — Patient Instructions (Signed)
Medication Instructions: Your physician recommends that you continue on your current medications as directed. Please refer to the Current Medication list given to you today.  Labwork: I will request your recent lab work from Dr. Landry Dyke office.   Follow-Up: Your physician wants you to follow-up in: 1 year with Dr. Allyson Sabal. You will receive a reminder letter in the mail two months in advance. If you don't receive a letter, please call our office to schedule the follow-up appointment.  If you need a refill on your cardiac medications before your next appointment, please call your pharmacy.

## 2017-07-28 ENCOUNTER — Other Ambulatory Visit: Payer: Self-pay | Admitting: Cardiovascular Disease

## 2017-07-28 ENCOUNTER — Other Ambulatory Visit: Payer: Self-pay

## 2017-07-28 DIAGNOSIS — R0683 Snoring: Secondary | ICD-10-CM

## 2017-07-28 DIAGNOSIS — E7800 Pure hypercholesterolemia, unspecified: Secondary | ICD-10-CM

## 2017-07-28 DIAGNOSIS — I10 Essential (primary) hypertension: Secondary | ICD-10-CM

## 2017-07-28 MED ORDER — RIVAROXABAN 20 MG PO TABS: 20.0000 mg | ORAL_TABLET | Freq: Every day | ORAL | 3 refills | Status: DC

## 2017-07-28 MED ORDER — ROSUVASTATIN CALCIUM 20 MG PO TABS: 20.0000 mg | ORAL_TABLET | Freq: Every day | ORAL | 3 refills | Status: DC

## 2017-07-28 MED ORDER — LOSARTAN POTASSIUM 50 MG PO TABS: 50.0000 mg | ORAL_TABLET | Freq: Every day | ORAL | 3 refills | Status: DC

## 2017-07-28 NOTE — Telephone Encounter (Signed)
Rx has been sent to the pharmacy electronically. ° °

## 2017-10-08 IMAGING — CT CT ABD-PELV W/ CM
2 of 5 series · 16 of 46 positions shown, 18 images · IV contrast (ISOVUE 300)
Comparison: None.

CLINICAL DATA: Left lower quadrant abdominal pain and left flank
pain.

EXAM:
CT ABDOMEN AND PELVIS WITH CONTRAST
TECHNIQUE: Multidetector CT imaging of the abdomen and pelvis was performed
using the standard protocol following bolus administration of
intravenous contrast.
CONTRAST:  100 cc [id number]

[Series 2: abd/pel w · axial · 0.94mm/px · z∈[-448,-23]mm · 13 of 95 slices shown, 15 images]
[im 5/95  soft-tissue]
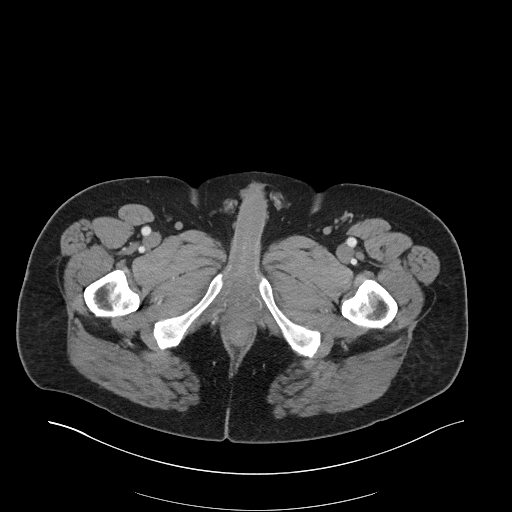
[im 5/95  bone]
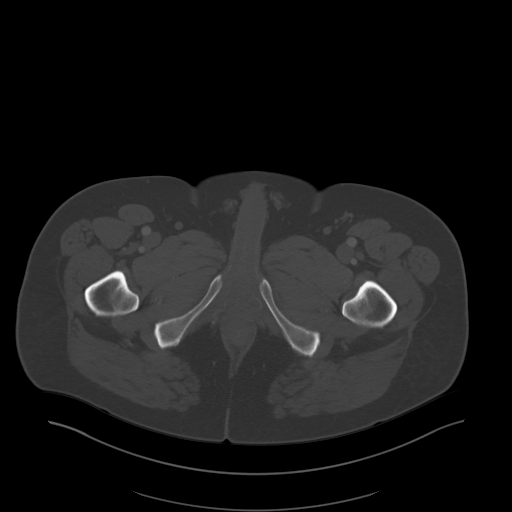
[im 15/95  soft-tissue]
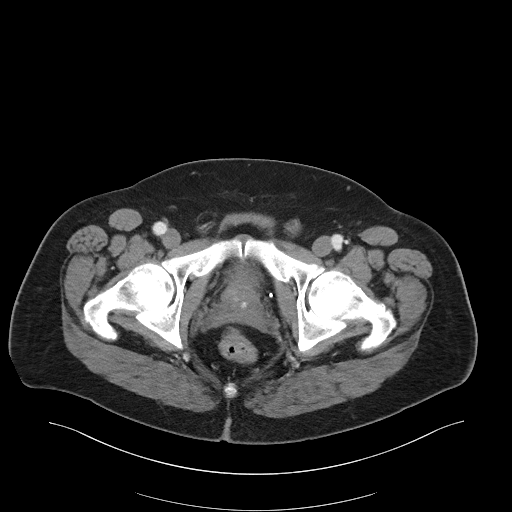
[im 20/95  soft-tissue]
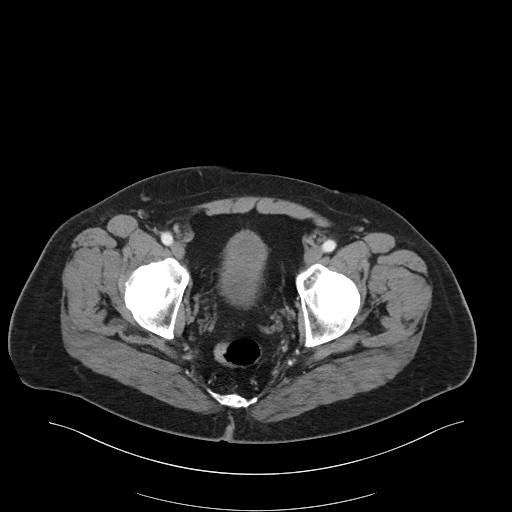
[im 25/95  soft-tissue]
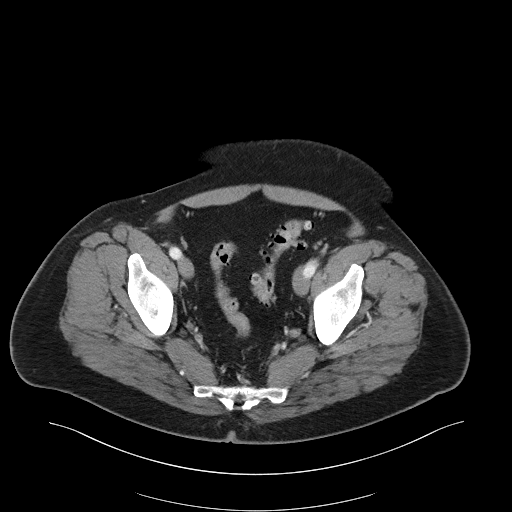
[im 35/95  soft-tissue]
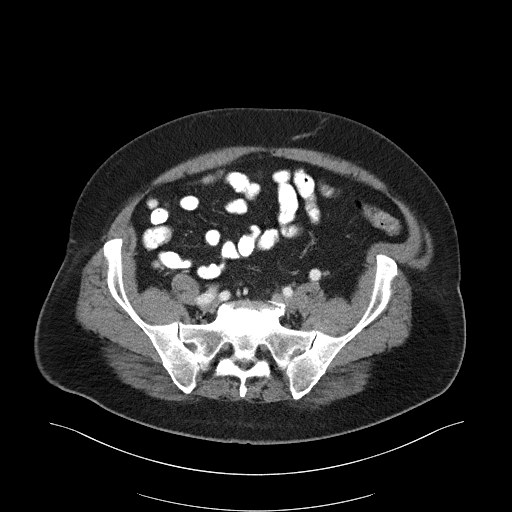
[im 40/95  soft-tissue]
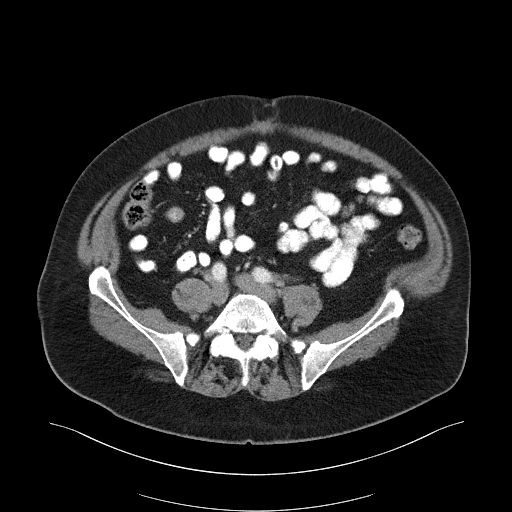
[im 50/95  soft-tissue]
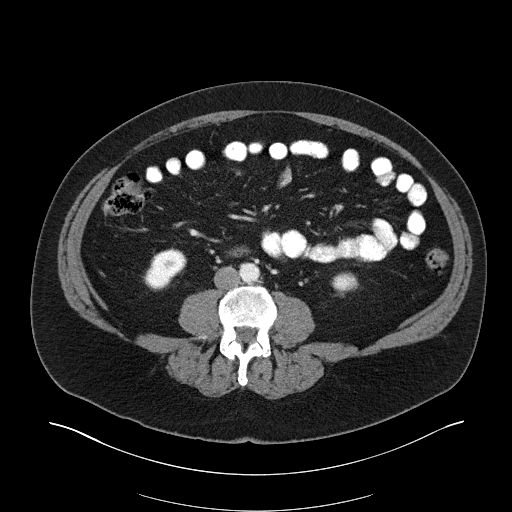
[im 55/95  soft-tissue]
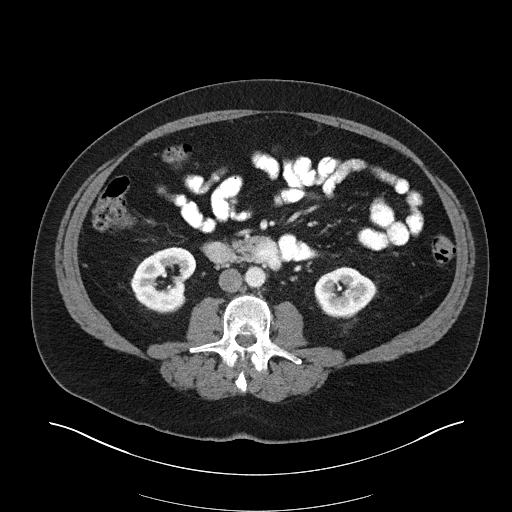
[im 60/95  soft-tissue]
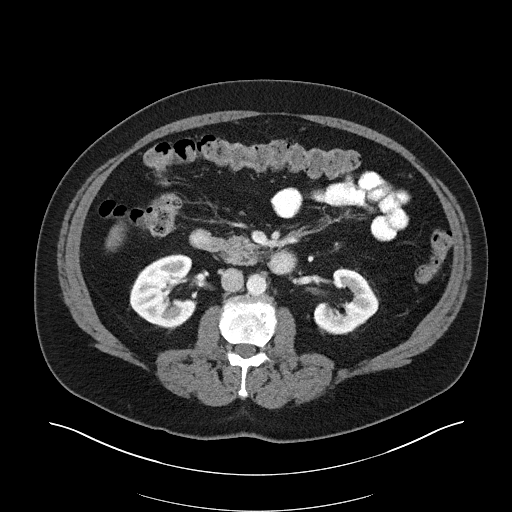
[im 60/95  bone]
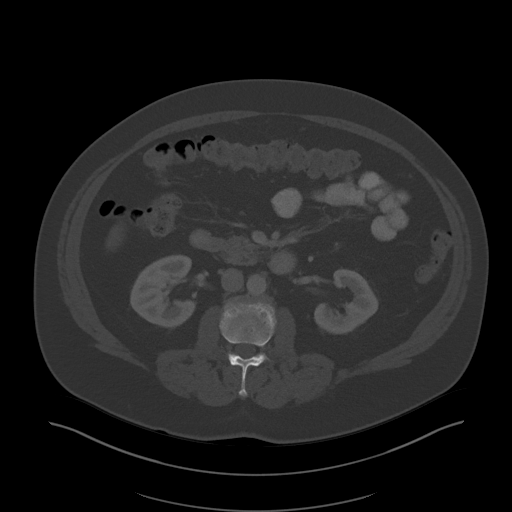
[im 70/95  soft-tissue]
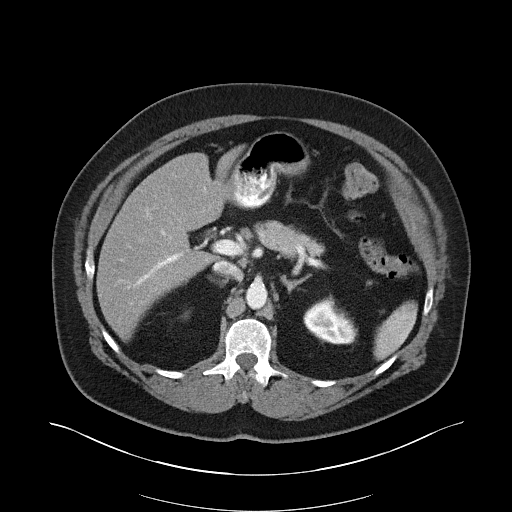
[im 75/95  soft-tissue]
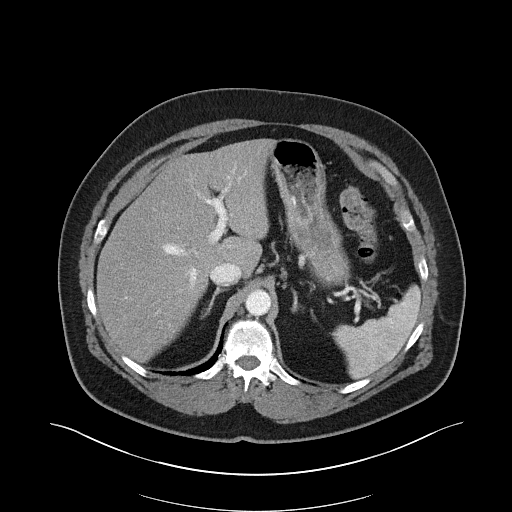
[im 80/95  soft-tissue]
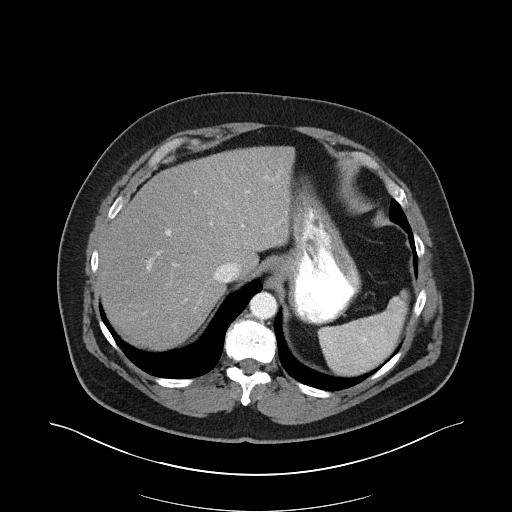
[im 90/95  soft-tissue]
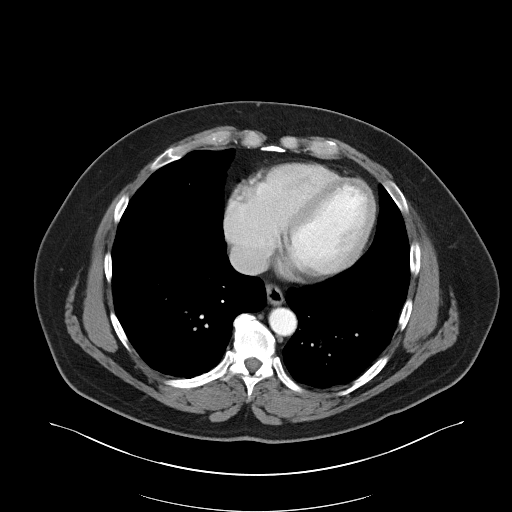

[Series 5: abd/pel w st · coronal · 0.80mm/px · 3 of 105 slices shown]
[im 35/105  soft-tissue]
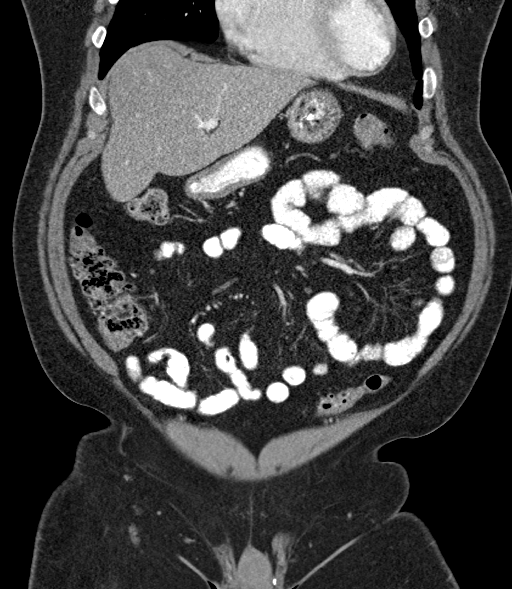
[im 47/105  soft-tissue]
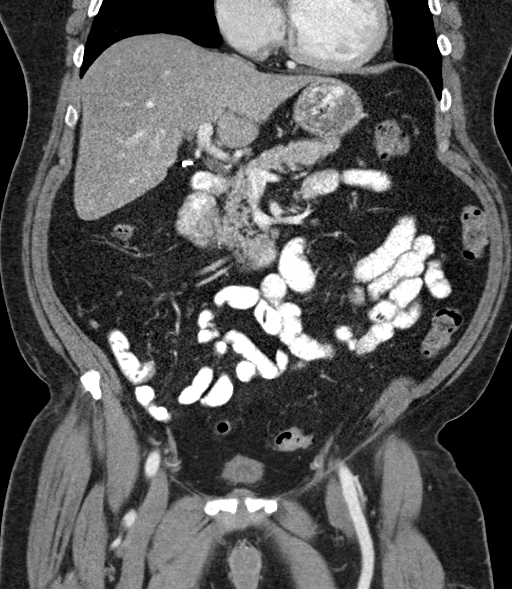
[im 58/105  soft-tissue]
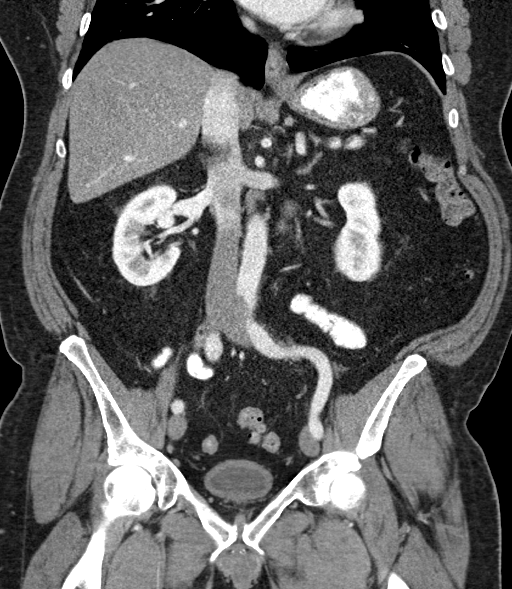

[16 of 46 positions shown; findings below may reference images not displayed]

FINDINGS: Lower chest: The lung bases are clear of acute process. No pleural
effusion or pulmonary lesions. The heart is normal in size. No
pericardial effusion. Coronary artery calcifications are noted. The
distal esophagus and aorta are unremarkable.

Hepatobiliary: No focal hepatic lesions or intrahepatic biliary
dilatation. The gallbladder is surgically absent. No common bile
duct dilatation.

Pancreas: No mass, inflammation or ductal dilatation.

Spleen: Normal size.  No focal lesions.

Adrenals/Urinary Tract: The adrenal glands are normal.

No renal, ureteral or bladder calculi or mass. No collecting system
abnormalities are identified on the delayed images.

Stomach/Bowel: The stomach, duodenum, small bowel and colon are
unremarkable. No inflammatory changes, mass lesions or obstructive
findings. Moderate to advanced sigmoid diverticulosis without
findings for acute diverticulitis. The terminal ileum and appendix
are normal.

Vascular/Lymphatic: The aorta is normal in caliber. No dissection.
The branch vessels are patent. The major venous structures are
patent. No mesenteric or retroperitoneal mass or adenopathy. Small
scattered lymph nodes are noted.

Reproductive: The prostate gland and seminal vesicles are
unremarkable.

Other: No pelvic mass or adenopathy. No free pelvic fluid
collections. No inguinal mass or adenopathy. Small right inguinal
hernia containing fat. No abdominal wall hernia or subcutaneous
lesions.

Musculoskeletal: No significant bony findings. Moderate to advanced
degenerative disc disease noted at L5-S1.
IMPRESSION: 1. No acute abdominal/pelvic findings, mass lesions or
lymphadenopathy.
2. Diverticulosis without findings for acute diverticulitis.
3. Status post cholecystectomy.  No biliary dilatation.

## 2017-11-04 ENCOUNTER — Encounter: Payer: Self-pay | Admitting: Cardiology

## 2017-11-04 ENCOUNTER — Ambulatory Visit: Payer: Medicare Other | Admitting: Cardiology

## 2017-11-04 VITALS — BP 136/66 | HR 69 | Ht 73.0 in | Wt 234.4 lb

## 2017-11-04 DIAGNOSIS — G4733 Obstructive sleep apnea (adult) (pediatric): Secondary | ICD-10-CM | POA: Diagnosis not present

## 2017-11-04 DIAGNOSIS — E669 Obesity, unspecified: Secondary | ICD-10-CM

## 2017-11-04 DIAGNOSIS — I10 Essential (primary) hypertension: Secondary | ICD-10-CM | POA: Diagnosis not present

## 2017-11-04 NOTE — Progress Notes (Addendum)
Cardiology Office Note:    Date:  11/04/2017   ID:  Brandon Soto, DOB September 05, 1945, MRN [id number]  PCP:  Wilburn Mylar, MD  Cardiologist:  No primary care provider on file.    Referring MD: Wilburn Mylar, MD   Chief Complaint  Patient presents with  . Sleep Apnea  . Hypertension    History of Present Illness:    Brandon Soto is a 72 y.o. male with a hx of mild to moderate OSA with an AHI of 14/hr. Most events occurred in REM non supine sleep with oxygen desaturations as low as 84%. He is on CPAP at 14cm H2O. He is doing well with his CPAP device.  He tolerates the nasal pillow mask and feels the pressure is adequate.  Since going on CPAP he feels rested in the am and has no significant daytime sleepiness.  He denies any significant mouth or nasal dryness or nasal congestion.  He does not think that he snores.  He has had a few problems with his CPAP device is working correctly.  He says it shuts off intermittently at night and then he cannot use it.   Past Medical History:  Diagnosis Date  . BPH (benign prostatic hyperplasia)   . Coronary artery disease    s/p LAD intervention 2001  . CVA (cerebral infarction)   . Dyslipidemia   . GERD (gastroesophageal reflux disease)   . Hypertension   . Hypothyroidism    "shrank w/radiation ?1990's" (06/27/2012)  . Left knee DJD   . OSA (obstructive sleep apnea) 11/29/2014  . PONV (postoperative nausea and vomiting)   . Rheumatoid arthritis(714.0)    "all over; hands especially" (06/27/2012)    Past Surgical History:  Procedure Laterality Date  . angioplasty N/A   . CARDIAC CATHETERIZATION  ~ 2009  . CHOLECYSTECTOMY  ~ 2006  . COLONOSCOPY  06/2014  . CORONARY ANGIOPLASTY    . CORONARY ANGIOPLASTY WITH STENT PLACEMENT  2001   "1" (06/27/2012)  . 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 LOOP RECORDER IMPLANT N/A 12/15/2013   Procedure: LOOP RECORDER IMPLANT;  Surgeon: Gardiner Rhyme,  MD;  Location: [facility] C[facility] LAB;  Service: Cardiovascular;  Laterality: N/A;  . MM CORP SCREENING MAMMO BIL/CAD ([facility] HX)    . PARTIAL KNEE ARTHROPLASTY  06/27/2012   Procedure: UNICOMPARTMENTAL KNEE;  Surgeon: Nilda Simmer, MD;  Location: [facility] OR;  Service: Orthopedics;  Laterality: Left;  left unicompartmental knee  . SHOULDER ARTHROSCOPY WITH ROTATOR CUFF REPAIR AND SUBACROMIAL DECOMPRESSION Left 2005   (06/27/2012)  . stent placement N/A   . TEE WITHOUT CARDIOVERSION N/A 01/03/2014   Procedure: TRANSESOPHAGEAL ECHOCARDIOGRAM (TEE);  Surgeon: Thurmon Fair, MD;  Location: [facility] ENDOSCOPY;  Service: Cardiovascular;  Laterality: N/A;    Current Medications: Current Meds  Medication Sig  . acetaminophen (TYLENOL) 325 MG tablet Take 650 mg by mouth every 6 (six) hours as needed for mild pain. For pain in hands  . levothyroxine (SYNTHROID, LEVOTHROID) 175 MCG tablet Take 175 mcg by mouth daily before breakfast.  . losartan (COZAAR) 50 MG tablet Take 1 tablet (50 mg total) by mouth daily.  . Multiple Vitamin (MULTIVITAMIN WITH MINERALS) TABS Take 1 tablet by mouth daily.  [name] omega-3 acid ethyl esters (LOVAZA) 1 G capsule Take 2 g by mouth daily. Taking the [facility] brand  . rivaroxaban (XARELTO) 20 MG TABS tablet Take 1 tablet (20 mg total)  by mouth daily with supper.  . rosuvastatin (CRESTOR) 20 MG tablet Take 1 tablet (20 mg total) by mouth at bedtime.     Allergies:   Patient has no known allergies.   Social History   Socioeconomic History  . Marital status: Married    Spouse name: Not on file  . Number of children: 2  . Years of education: Not on file  . Highest education level: Not on file  Occupational History  . Occupation: retired  Engineer, production  . Financial resource strain: Not on file  . Food insecurity:    Worry: Not on file    Inability: Not on file  . Transportation needs:    Medical: Not on file    Non-medical: Not on file  Tobacco Use  . Smoking status: Never Smoker  .  Smokeless tobacco: Never Used  Substance and Sexual Activity  . Alcohol use: No    Alcohol/week: 0.0 oz  . Drug use: No  . Sexual activity: Yes  Lifestyle  . Physical activity:    Days per week: Not on file    Minutes per session: Not on file  . Stress: Not on file  Relationships  . Social connections:    Talks on phone: Not on file    Gets together: Not on file    Attends religious service: Not on file    Active member of club or organization: Not on file    Attends meetings of clubs or organizations: Not on file    Relationship status: Not on file  Other Topics Concern  . Not on file  Social History Narrative  . Not on file     Family History: The patient's family history includes Cancer - Lung in his brother; Heart attack in his mother; Heart disease in his brother; Liver cancer in his brother.  ROS:   Please see the history of present illness.    ROS  All other systems reviewed and negative.   EKGs/Labs/Other Studies Reviewed:    The following studies were reviewed today: PAP download  EKG:  EKG is not ordered today.   Recent Labs: No results found for requested labs within last 8760 hours.   Recent Lipid Panel    Component Value Date/Time   CHOL 101 (L) 07/09/2015 0824   TRIG 99 07/09/2015 0824   HDL 39 (L) 07/09/2015 0824   CHOLHDL 2.6 07/09/2015 0824   VLDL 20 07/09/2015 0824   LDLCALC 42 07/09/2015 0824    Physical Exam:    VS:  BP 136/66   Pulse 69   Ht 6\' 1"  (1.854 m)   Wt 234 lb 6.4 oz (106.3 kg)   SpO2 97%   BMI 30.93 kg/m     Wt Readings from Last 3 Encounters:  11/04/17 234 lb 6.4 oz (106.3 kg)  07/14/17 241 lb 9.6 oz (109.6 kg)  10/28/16 242 lb 4 oz (109.9 kg)     GEN:  Well nourished, well developed in no acute distress HEENT: Normal NECK: No JVD; No carotid bruits LYMPHATICS: No lymphadenopathy CARDIAC: RRR, no murmurs, rubs, gallops RESPIRATORY:  Clear to auscultation without rales, wheezing or rhonchi  ABDOMEN: Soft,  non-tender, non-distended MUSCULOSKELETAL:  No edema; No deformity  SKIN: Warm and dry NEUROLOGIC:  Alert and oriented x 3 PSYCHIATRIC:  Normal affect   ASSESSMENT:    1. OSA (obstructive sleep apnea)   2. Essential hypertension   3. Obesity (BMI 30-39.9)    PLAN:    In  order of problems listed above:  1.  OSA - the patient is tolerating PAP therapy well without any problems. The PAP download was reviewed today and showed an AHI of 0.8/hr on 14 cm H2O with 67% compliance in using more than 4 hours nightly.  The patient has been using and benefiting from PAP use and will continue to benefit from therapy.  I encouraged him to be more compliant with his CPAP device.  I encouraged him to take his device by to his DME to get it checked since it keeps cutting off.    2.  Hypertension -Blood pressures well controlled on exam today.  He will continue on losartan 50 mg daily.  3.  Obesity - I have encouraged him to get into a routine exercise program and cut back on carbs and portions.    Medication Adjustments/Labs and Tests Ordered: Current medicines are reviewed at length with the patient today.  Concerns regarding medicines are outlined above.  No orders of the defined types were placed in this encounter.  No orders of the defined types were placed in this encounter.   Signed, Armanda Magic, MD  11/04/2017 2:35 PM    [facility]

## 2017-11-04 NOTE — Patient Instructions (Signed)
Medication Instructions:  Your physician recommends that you continue on your current medications as directed. Please refer to the Current Medication list given to you today.  If you need a refill on your cardiac medications, please contact your pharmacy first.  Labwork: None ordered   Testing/Procedures: None ordered   Follow-Up: Your physician wants you to follow-up in: 1 year with Dr. Turner. You will receive a reminder letter in the mail two months in advance. If you don't receive a letter, please call our office to schedule the follow-up appointment.  Any Other Special Instructions Will Be Listed Below (If Applicable).   Thank you for choosing [facility]    Rena Teandre Hamre, RN  [phone]  If you need a refill on your cardiac medications before your next appointment, please call your pharmacy.   

## 2018-01-18 DIAGNOSIS — G629 Polyneuropathy, unspecified: Secondary | ICD-10-CM | POA: Insufficient documentation

## 2018-02-02 ENCOUNTER — Encounter: Payer: Self-pay | Admitting: Internal Medicine

## 2018-02-02 ENCOUNTER — Telehealth: Payer: Self-pay

## 2018-02-02 ENCOUNTER — Ambulatory Visit: Payer: Medicare Other | Admitting: Internal Medicine

## 2018-02-02 VITALS — BP 128/80 | HR 74 | Ht 73.0 in | Wt 241.4 lb

## 2018-02-02 DIAGNOSIS — K219 Gastro-esophageal reflux disease without esophagitis: Secondary | ICD-10-CM | POA: Diagnosis not present

## 2018-02-02 DIAGNOSIS — Z7901 Long term (current) use of anticoagulants: Secondary | ICD-10-CM | POA: Diagnosis not present

## 2018-02-02 DIAGNOSIS — I480 Paroxysmal atrial fibrillation: Secondary | ICD-10-CM | POA: Diagnosis not present

## 2018-02-02 DIAGNOSIS — Z800 Family history of malignant neoplasm of digestive organs: Secondary | ICD-10-CM

## 2018-02-02 MED ORDER — ESOMEPRAZOLE MAGNESIUM 40 MG PO CPDR: 40.0000 mg | DELAYED_RELEASE_CAPSULE | Freq: Every day | ORAL | 3 refills | Status: DC

## 2018-02-02 NOTE — Progress Notes (Signed)
Brandon Soto 72 y.o. 1946/07/13 [id number]  Assessment & Plan:   Encounter Diagnoses  Name Primary?  . Gastroesophageal reflux disease, esophagitis presence not specified Yes  . Family history of esophageal cancer - 2 brothers   . Long term current use of anticoagulant - Xarelto   . PAF (paroxysmal atrial fibrillation) (HCC)      Start as omeprazole 40 mg daily.  Consider titrating dose down depending upon effect. Need to review GERD lifestyle changes as well at follow-up plan for EGD in a month or 2.  I want any esophageal inflammation it might be present to be healed because he is at high risk of Barrett's esophagus given the family history of esophageal cancer.  Hold Xarelto the day before will double check with cardiology to make sure this is acceptable.  Extra risk of stroke off of Xarelto explained to the patient. The risks and benefits as well as alternatives of endoscopic procedure(s) have been discussed and reviewed. All questions answered. The patient agrees to proceed.   I appreciate the opportunity to care for this patient. CC: Wilburn Mylar, MD   Subjective:   Chief Complaint: Reflux  HPI The patient is here with his wife with complaints of heartburn and reflux and coughing that occur on a daily basis.  It has been going on for months or more.  He has had heartburn in the past though not as severe.  I had seen him last year he had left lower quadrant tenderness that was probably musculoskeletal as a CT scan did not reveal anything and it did resolve on its own.  He does not have any dysphagia.  He is concerned additionally because 2 brothers have died from esophageal cancer one very recently.  The patient has a history of what was probably an ischemic stroke though he also has a history of paroxysmal atrial fibrillation discovered years after that through a loop monitor.  He takes Xarelto daily in the evenings.  Dr. Allyson Sabal is his cardiologist No Known  Allergies Current Meds  Medication Sig  . acetaminophen (TYLENOL) 325 MG tablet Take 650 mg by mouth every 6 (six) hours as needed for mild pain. For pain in hands  . levothyroxine (SYNTHROID, LEVOTHROID) 175 MCG tablet Take 175 mcg by mouth daily before breakfast.  . losartan (COZAAR) 50 MG tablet Take 1 tablet (50 mg total) by mouth daily.  . Multiple Vitamin (MULTIVITAMIN WITH MINERALS) TABS Take 1 tablet by mouth daily.  [name] omega-3 acid ethyl esters (LOVAZA) 1 G capsule Take 2 g by mouth daily. Taking the [facility] brand  . rivaroxaban (XARELTO) 20 MG TABS tablet Take 1 tablet (20 mg total) by mouth daily with supper.  . rosuvastatin (CRESTOR) 20 MG tablet Take 1 tablet (20 mg total) by mouth at bedtime.  . tamsulosin (FLOMAX) 0.4 MG CAPS capsule Take 0.4 mg by mouth daily.   Past Medical History:  Diagnosis Date  . BPH (benign prostatic hyperplasia)   . Coronary artery disease    s/p LAD intervention 2001  . CVA (cerebral infarction)   . Dyslipidemia   . GERD (gastroesophageal reflux disease)   . Hypertension   . Hypothyroidism    "shrank w/radiation ?1990's" (06/27/2012)  . Left knee DJD   . OSA (obstructive sleep apnea) 11/29/2014  . PONV (postoperative nausea and vomiting)   . Rheumatoid arthritis(714.0)    "all over; hands especially" (06/27/2012)   Past Surgical History:  Procedure Laterality Date  . angioplasty N/A   .  CARDIAC CATHETERIZATION  ~ 2009  . CHOLECYSTECTOMY  ~ 2006  . COLONOSCOPY  06/2014  . CORONARY ANGIOPLASTY    . CORONARY ANGIOPLASTY WITH STENT PLACEMENT  2001   "1" (06/27/2012)  . 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 LOOP RECORDER IMPLANT N/A 12/15/2013   Procedure: LOOP RECORDER IMPLANT;  Surgeon: Gardiner Rhyme, MD;  Location: [facility] CATH LAB;  Service: Cardiovascular;  Laterality: N/A;  . MM CORP SCREENING MAMMO BIL/CAD ([facility] HX)    . PARTIAL KNEE ARTHROPLASTY  06/27/2012   Procedure: UNICOMPARTMENTAL KNEE;   Surgeon: Nilda Simmer, MD;  Location: [facility] OR;  Service: Orthopedics;  Laterality: Left;  left unicompartmental knee  . SHOULDER ARTHROSCOPY WITH ROTATOR CUFF REPAIR AND SUBACROMIAL DECOMPRESSION Left 2005   (06/27/2012)  . stent placement N/A   . TEE WITHOUT CARDIOVERSION N/A 01/03/2014   Procedure: TRANSESOPHAGEAL ECHOCARDIOGRAM (TEE);  Surgeon: Thurmon Fair, MD;  Location: [facility] ENDOSCOPY;  Service: Cardiovascular;  Laterality: N/A;   Social History   Social History Narrative   Married and retired, 2 children   Stays busy keeping up his [place] home plus he has a home in [place] and takes care of his mother-in-law's home   No alcohol tobacco or drug use   family history includes Cancer - Lung in his brother; Heart attack in his mother; Heart disease in his brother; Liver cancer in his brother.   Review of Systems Recent right back pain better attributed to a lot of yard work lately  Objective:   Physical Exam BP 128/80   Pulse 74   Ht 6\' 1"  (1.854 m)   Wt 241 lb 6 oz (109.5 kg)   BMI 31.85 kg/m  No acute distress Eyes are anicteric Lungs clear Normal heart sounds distant Abdomen is obese Mouth and posterior pharynx are clear The neck is supple no thyromegaly or mass and no carotid bruits  Data reviewed previous GI visits negative colonoscopy 2015 cardiology notes

## 2018-02-02 NOTE — Patient Instructions (Signed)
You have been scheduled for an endoscopy. Please follow written instructions given to you at your visit today. If you use inhalers (even only as needed), please bring them with you on the day of your procedure.  You will be contaced by our office prior to your procedure for directions on holding your xarelto.  If you do not hear from our office 1 week prior to your scheduled procedure, please call [phone] to discuss.    We have sent the following medications to your pharmacy for you to pick up at your convenience: Generic Nexium    I appreciate the opportunity to care for you. Stan Head, MD, [facility]

## 2018-02-02 NOTE — Telephone Encounter (Signed)
History of afib and stroke. Will route to pharmacy for input on holding anticoagulation.

## 2018-02-02 NOTE — Telephone Encounter (Signed)
[facility] Pre-operative Risk Assessment     Request for surgical clearance:     Endoscopy Procedure  What type of surgery is being performed?     EGD  When is this surgery scheduled?     03/22/18  What type of clearance is required ?   Pharmacy  Are there any medications that need to be held prior to surgery and how long? Xarelto, one day  Practice name and name of physician performing surgery?      [facility]  What is your office phone and fax number?      Phone- [phone]  [phone]  Anesthesia type (None, local, MAC, general) ?       MAC

## 2018-02-02 NOTE — Telephone Encounter (Signed)
Patient with diagnosis of atrial fibrillation on Xarelto for anticoagulation.    Procedure: EGD Date of procedure: 03/22/18  CHADS2-VASc score of  5 (, HTN, AGE,  stroke/tia x 2, CAD, )  By office notes, stroke appears to have been prior to 2016  CrCl 107.7  (labs from April 2018) Platelet count 180  Per office protocol, patient can hold Xarelto for 1 days prior to procedure.    Patient should restart Xarelto on the evening of procedure or day after, at discretion of procedure MD

## 2018-02-03 NOTE — Telephone Encounter (Signed)
Left voicemail for patient to call back. Per cardiology, he may hold xarelto 1 day prior to endoscopy procedure and restart evening of procedure or day after per discretion of Dr Leone Payor.

## 2018-02-03 NOTE — Telephone Encounter (Signed)
   Primary Cardiologist: Nanetta Batty, MD  Chart reviewed as part of pre-operative protocol coverage. Patient was contacted 02/03/2018 in reference to pre-operative risk assessment for pending surgery as outlined below.  DOMENIQUE SOUTHERS was last seen on 07/14/17 by Dr. Allyson Sabal and on 11/04/17 by Dr. Mayford Knife.  Since that day, TARIUS STANGELO has done well with no chest discomfort, shortness of breath or palpitations. His last loop recorder download showed no afib.   Therefore, based on ACC/AHA guidelines, the patient would be at acceptable risk for the planned procedure without further cardiovascular testing.   Per our pharmacy protocol: CHADS2-VASc score of  5 (, HTN, AGE,  stroke/tia x 2, CAD, )  By office notes, stroke appears to have been prior to 2016  CrCl 107.7  (labs from April 2018) Platelet count 180  Per office protocol, patient can hold Xarelto for 1 days prior to procedure.   Patient should restart Xarelto on the evening of procedure or day after, at discretion of procedure MD  I will route this recommendation to the requesting party via Epic fax function and remove from pre-op pool.  Please call with questions.  Berton Bon, NP 02/03/2018, 1:31 PM

## 2018-02-04 NOTE — Telephone Encounter (Signed)
Spoke to patient and informed him to hold Xarelto one day before his procedure. He states cardiology already called him and went over the details. He is aware he will be instructed at discharge when to restart Xarelto.

## 2018-03-08 ENCOUNTER — Encounter: Payer: Self-pay | Admitting: Internal Medicine

## 2018-03-22 ENCOUNTER — Ambulatory Visit (AMBULATORY_SURGERY_CENTER): Payer: Medicare Other | Admitting: Internal Medicine

## 2018-03-22 ENCOUNTER — Encounter: Payer: Self-pay | Admitting: Internal Medicine

## 2018-03-22 VITALS — BP 108/54 | HR 55 | Temp 98.6°F | Resp 18 | Ht 73.0 in | Wt 241.0 lb

## 2018-03-22 DIAGNOSIS — K219 Gastro-esophageal reflux disease without esophagitis: Secondary | ICD-10-CM | POA: Diagnosis not present

## 2018-03-22 MED ORDER — ESOMEPRAZOLE MAGNESIUM 40 MG PO CPDR: 40.0000 mg | DELAYED_RELEASE_CAPSULE | Freq: Two times a day (BID) | ORAL | 3 refills | Status: DC

## 2018-03-22 MED ORDER — SODIUM CHLORIDE 0.9 % IV SOLN: 500.0000 mL | Freq: Once | INTRAVENOUS | Status: DC

## 2018-03-22 NOTE — Patient Instructions (Addendum)
This looks normal.  I have prescribed twice a day esomeprazole to see if that controls the reflux better.  Please make an appointment to see me in November - call later in September to schedule that.  I appreciate the opportunity to care for you. Iva Boop, MD, FACG   YOU HAD AN ENDOSCOPIC PROCEDURE TODAY AT THE [facility]:   Refer to the procedure report that was given to you for any specific questions about what was found during the examination.  If the procedure report does not answer your questions, please call your gastroenterologist to clarify.  If you requested that your care partner not be given the details of your procedure findings, then the procedure report has been included in a sealed envelope for you to review at your convenience later.  YOU SHOULD EXPECT: Some feelings of bloating in the abdomen. Passage of more gas than usual.  Walking can help get rid of the air that was put into your GI tract during the procedure and reduce the bloating. If you had a lower endoscopy (such as a colonoscopy or flexible sigmoidoscopy) you may notice spotting of blood in your stool or on the toilet paper. If you underwent a bowel prep for your procedure, you may not have a normal bowel movement for a few days.  Please Note:  You might notice some irritation and congestion in your nose or some drainage.  This is from the oxygen used during your procedure.  There is no need for concern and it should clear up in a day or so.  SYMPTOMS TO REPORT IMMEDIATELY:   Following lower endoscopy (colonoscopy or flexible sigmoidoscopy):  Excessive amounts of blood in the stool  Significant tenderness or worsening of abdominal pains  Swelling of the abdomen that is new, acute  Fever of 100F or higher   Following upper endoscopy (EGD)  Vomiting of blood or coffee ground material  New chest pain or pain under the shoulder blades  Painful or persistently difficult  swallowing  New shortness of breath  Fever of 100F or higher  Black, tarry-looking stools  For urgent or emergent issues, a gastroenterologist can be reached at any hour by calling [phone].   DIET:  We do recommend a small meal at first, but then you may proceed to your regular diet.  Drink plenty of fluids but you should avoid alcoholic beverages for 24 hours.  ACTIVITY:  You should plan to take it easy for the rest of today and you should NOT DRIVE or use heavy machinery until tomorrow (because of the sedation medicines used during the test).    FOLLOW UP: Our staff will call the number listed on your records the next business day following your procedure to check on you and address any questions or concerns that you may have regarding the information given to you following your procedure. If we do not reach you, we will leave a message.  However, if you are feeling well and you are not experiencing any problems, there is no need to return our call.  We will assume that you have returned to your regular daily activities without incident.  If any biopsies were taken you will be contacted by phone or by letter within the next 1-3 weeks.  Please call us at [phone] if you have not heard about the biopsies in 3 weeks.    SIGNATURES/CONFIDENTIALITY: You and/or your care partner have signed paperwork which will be entered into your  electronic medical record.  These signatures attest to the fact that that the information above on your After Visit Summary has been reviewed and is understood.  Full responsibility of the confidentiality of this discharge information lies with you and/or your care-partner.  Resume Xarelto today.  Increase esomeprazole to 40 mg. Twice daily.  Call for app. With Dr. Leone Payor in November

## 2018-03-22 NOTE — Progress Notes (Signed)
Report to PACU, RN, vss, BBS= Clear.  

## 2018-03-22 NOTE — Op Note (Addendum)
[facility] Patient Name: Neeraj Housand Procedure Date: 03/22/2018 10:10 AM MRN: [id number] Endoscopist: Iva Boop , MD Age: 72 Referring MD:  Date of Birth: [birth year] Gender: Male Account #: [id number] Procedure:                Upper GI endoscopy Indications:              Heartburn, Esophageal reflux symptoms that persist                            despite appropriate therapy, Family history of                            esophageal cancer Procedure:                Pre-Anesthesia Assessment:                           - Prior to the procedure, a History and Physical                            was performed, and patient medications and                            allergies were reviewed. The patient's tolerance of                            previous anesthesia was also reviewed. The risks                            and benefits of the procedure and the sedation                            options and risks were discussed with the patient.                            All questions were answered, and informed consent                            was obtained. Prior Anticoagulants: The patient                            last took Xarelto (rivaroxaban) 1 day prior to the                            procedure. ASA Grade Assessment: III - A patient                            with severe systemic disease. After reviewing the                            risks and benefits, the patient was deemed in                            satisfactory condition to undergo the procedure.  After obtaining informed consent, the endoscope was                            passed under direct vision. Throughout the                            procedure, the patient's blood pressure, pulse, and                            oxygen saturations were monitored continuously. The                            Endoscope was introduced through the mouth, and                            advanced to the  second part of duodenum. The upper                            GI endoscopy was accomplished without difficulty.                            The patient tolerated the procedure well. Scope In: Scope Out: Findings:                 The esophagus was normal.                           The stomach was normal.                           The examined duodenum was normal. Complications:            No immediate complications. Estimated Blood Loss:     Estimated blood loss: none. Impression:               - Normal esophagus.                           - Normal stomach.                           - Normal examined duodenum.                           - No specimens collected. Recommendation:           - Patient has a contact number available for                            emergencies. The signs and symptoms of potential                            delayed complications were discussed with the                            patient. Return to normal activities tomorrow.  Written discharge instructions were provided to the                            patient.                           - Resume previous diet.                           - Continue present medications.                           - Increase esomeprazole to 40 mg twice a day and                            call for appointment to see me in November.                           - Resume Xarelto (rivaroxaban) at prior dose today. Iva Boop, MD 03/22/2018 10:34:03 AM This report has been signed electronically.

## 2018-03-23 ENCOUNTER — Telehealth: Payer: Self-pay

## 2018-03-23 NOTE — Telephone Encounter (Signed)
  Follow up Call-  Call back number 03/22/2018  Post procedure Call Back phone  # [phone]  Permission to leave phone message Yes  Some recent data might be hidden     Patient questions:  Do you have a fever, pain , or abdominal swelling? No. Pain Score  0 *  Have you tolerated food without any problems? Yes.    Have you been able to return to your normal activities? Yes.    Do you have any questions about your discharge instructions: Diet   No. Medications  No. Follow up visit  No.  Do you have questions or concerns about your Care? No.  Actions: * If pain score is 4 or above: No action needed, pain <4.

## 2018-04-25 ENCOUNTER — Telehealth: Payer: Self-pay | Admitting: Internal Medicine

## 2018-04-25 NOTE — Telephone Encounter (Signed)
I spoke with Brandon Soto and he said he is taking his nexium twice daily but having the following symptoms: weight gain, no energy, "painfully sick" on his stomach, feels better after bowel movement. He has no fever, no heartburn on the increased dosage, no burping. He saw about the generic zantac and is alittle concerned about drug recalls. He has an appointment later on this month. Any suggestions about what to do in the mean time Sir? Thank you.

## 2018-04-26 NOTE — Telephone Encounter (Signed)
So I need to know if these are different symptoms vs the same symptoms prior to the Nexium  I don't think so from my review but wanted to see

## 2018-04-26 NOTE — Telephone Encounter (Signed)
I spoke with Brandon Soto and he said that yes these are pretty much the same symptoms that he had before starting the Nexium. He reports that he's had stomach problems since he was a child and that his daughter and son both have stomach issues.

## 2018-04-27 NOTE — Telephone Encounter (Signed)
Patient informed and will try the IBgard and let us know if it helps.

## 2018-04-27 NOTE — Telephone Encounter (Signed)
Since he is same on bid Nexium that is notthe answer  Want to try to back off that so go to 1x/day  Purchase IB Hammond and take 2 before meals and see what that does - explain it helps stomach and intestinal pains  He may have IBS  He should try that this week and let me know how he is next week

## 2018-05-24 ENCOUNTER — Other Ambulatory Visit: Payer: Self-pay | Admitting: Cardiovascular Disease

## 2018-05-24 DIAGNOSIS — E7800 Pure hypercholesterolemia, unspecified: Secondary | ICD-10-CM

## 2018-05-24 DIAGNOSIS — I10 Essential (primary) hypertension: Secondary | ICD-10-CM

## 2018-05-24 DIAGNOSIS — R0683 Snoring: Secondary | ICD-10-CM

## 2018-05-24 NOTE — Telephone Encounter (Signed)
Dr. Allyson Sabal is the primary cardiologist. Please address

## 2018-05-25 ENCOUNTER — Ambulatory Visit: Payer: Medicare Other | Admitting: Internal Medicine

## 2018-05-25 ENCOUNTER — Other Ambulatory Visit: Payer: Medicare Other

## 2018-05-25 ENCOUNTER — Encounter: Payer: Self-pay | Admitting: Internal Medicine

## 2018-05-25 VITALS — BP 134/78 | HR 68 | Ht 73.0 in | Wt 250.8 lb

## 2018-05-25 DIAGNOSIS — Z8619 Personal history of other infectious and parasitic diseases: Secondary | ICD-10-CM | POA: Diagnosis not present

## 2018-05-25 DIAGNOSIS — K580 Irritable bowel syndrome with diarrhea: Secondary | ICD-10-CM

## 2018-05-25 DIAGNOSIS — K219 Gastro-esophageal reflux disease without esophagitis: Secondary | ICD-10-CM

## 2018-05-25 DIAGNOSIS — K529 Noninfective gastroenteritis and colitis, unspecified: Secondary | ICD-10-CM | POA: Diagnosis not present

## 2018-05-25 NOTE — Patient Instructions (Addendum)
  Your provider has requested that you go to the basement level for lab work before leaving today. Press "B" on the elevator. The lab is located at the first door on the left as you exit the elevator.   You have been given a testing kit to check for small intestine bacterial overgrowth (SIBO) which is completed by a company named [employer]. Make sure to return your test in the mail using the return mailing label given you along with the kit. Your demographic and insurance information have already been sent to the company and they should be in contact with you over the next week regarding this test. Please keep in mind that you will be getting a call from phone number [phone] or a similar number. If you do not hear from them within this time frame, please call our office at [phone].    I appreciate the opportunity to care for you. Silvano Rusk, MD, [facility]

## 2018-05-25 NOTE — Progress Notes (Signed)
Brandon Soto 72 y.o. 03-23-1946 [id number]  Assessment & Plan:   Encounter Diagnoses  Name Primary?  . Gastroesophageal reflux disease, esophagitis presence not specified Yes  . Irritable bowel syndrome with diarrhea   . Chronic diarrhea   . History of giardia infection    GERD is improved Stay on PPI  Work-up his diarrhea further with immunoglobulin levels a tissue transglutaminase antibody and a lactulose hydrogen breath test to look for SIBO  Further plans pending those results.  I appreciate the opportunity to care for him Brandon Boop, MD, [facility]    Subjective:   Chief Complaint: GERD, diarrhea  HPI Brandon Soto is here with his wife reporting that now that he is on twice daily Nexium after a normal-appearing EGD his heartburn is a lot less.  His wife notes that he still burps a lot.  He still suffers with chronic diarrhea "for 40 years" off and on.  He says it all started with a Giardia infection.  Sometimes in the past he thinks that if he is taken metronidazole he will improve.  I did treat him with Xifaxan after negative random colon biopsies in 2015 but is not clear if that made a difference.  Some days are not so bad but the urgency and unpredictability of it make it difficult for him in terms of quality of life. No Known Allergies Current Meds  Medication Sig  . acetaminophen (TYLENOL) 325 MG tablet Take 650 mg by mouth every 6 (six) hours as needed for mild pain. For pain in hands  . esomeprazole (NEXIUM) 40 MG capsule Take 1 capsule (40 mg total) by mouth 2 (two) times daily before a meal.  . levothyroxine (SYNTHROID, LEVOTHROID) 175 MCG tablet Take 175 mcg by mouth daily before breakfast.  . losartan (COZAAR) 50 MG tablet TAKE 1 TABLET BY MOUTH  DAILY  . Multiple Vitamin (MULTIVITAMIN WITH MINERALS) TABS Take 1 tablet by mouth daily.  [name] omega-3 acid ethyl esters (LOVAZA) 1 G capsule Take 2 g by mouth daily. Taking the [facility] brand  . rivaroxaban (XARELTO) 20 MG  TABS tablet Take 1 tablet (20 mg total) by mouth daily with supper.  . rosuvastatin (CRESTOR) 20 MG tablet TAKE 1 TABLET BY MOUTH AT  BEDTIME  . tamsulosin (FLOMAX) 0.4 MG CAPS capsule Take 0.4 mg by mouth daily.   Past Medical History:  Diagnosis Date  . BPH (benign prostatic hyperplasia)   . Cataract    bilateral  . Coronary artery disease    s/p LAD intervention 2001  . CVA (cerebral infarction)   . Dyslipidemia   . GERD (gastroesophageal reflux disease)   . Hypertension   . Hypothyroidism    "shrank w/radiation ?1990's" (06/27/2012)  . Left knee DJD   . Myocardial infarction (HCC)   . OSA (obstructive sleep apnea) 11/29/2014  . PONV (postoperative nausea and vomiting)   . Rheumatoid arthritis(714.0)    "all over; hands especially" (06/27/2012)  . Sleep apnea    cpap  . Stroke [facility])    Past Surgical History:  Procedure Laterality Date  . angioplasty N/A   . CARDIAC CATHETERIZATION  ~ 2009  . CHOLECYSTECTOMY  ~ 2006  . COLONOSCOPY  06/2014  . CORONARY ANGIOPLASTY    . CORONARY ANGIOPLASTY WITH STENT PLACEMENT  2001   "1" (06/27/2012)  . 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 LOOP RECORDER IMPLANT N/A 12/15/2013   Procedure: LOOP  RECORDER IMPLANT;  Surgeon: Gardiner Rhyme, MD;  Location: [facility] CATH LAB;  Service: Cardiovascular;  Laterality: N/A;  . MM CORP SCREENING MAMMO BIL/CAD ([facility] HX)    . PARTIAL KNEE ARTHROPLASTY  06/27/2012   Procedure: UNICOMPARTMENTAL KNEE;  Surgeon: Nilda Simmer, MD;  Location: [facility] OR;  Service: Orthopedics;  Laterality: Left;  left unicompartmental knee  . SHOULDER ARTHROSCOPY WITH ROTATOR CUFF REPAIR AND SUBACROMIAL DECOMPRESSION Left 2005   (06/27/2012)  . stent placement N/A   . TEE WITHOUT CARDIOVERSION N/A 01/03/2014   Procedure: TRANSESOPHAGEAL ECHOCARDIOGRAM (TEE);  Surgeon: Thurmon Fair, MD;  Location: [facility] ENDOSCOPY;  Service: Cardiovascular;  Laterality: N/A;   Social History   Social  History Narrative   Married and retired, 2 children   Stays busy keeping up his [place] home plus he has a home in [place] and takes care of his mother-in-law's home   No alcohol tobacco or drug use   family history includes Cancer - Lung in his brother; Esophageal cancer in his brother; Heart attack in his mother; Heart disease in his brother; Liver cancer in his brother.   Review of Systems As per HPI  Objective:   Physical Exam BP 134/78   Pulse 68   Ht 6\' 1"  (1.854 m)   Wt 250 lb 12.8 oz (113.8 kg)   BMI 33.09 kg/m  Well-developed well-nourished no acute distress   15 minutes time spent with patient > half in counseling coordination of care

## 2018-05-26 LAB — IGG, IGA, IGM
IGM, SERUM: 40 mg/dL — AB (ref 50–300)
IgG (Immunoglobin G), Serum: 1064 mg/dL (ref 600–1540)
Immunoglobulin A: 396 mg/dL — ABNORMAL HIGH (ref 20–320)

## 2018-05-26 LAB — TISSUE TRANSGLUTAMINASE, IGA: (TTG) AB, IGA: 1 U/mL

## 2018-05-27 NOTE — Progress Notes (Signed)
No sig abnormalities My Chart Wait on SIBO test ? If FODMAPS candidate

## 2018-06-22 ENCOUNTER — Telehealth: Payer: Self-pay | Admitting: *Deleted

## 2018-06-22 NOTE — Telephone Encounter (Signed)
   [facility] Pre-operative Risk Assessment    Request for surgical clearance:  1. What type of surgery is being performed? CATARACT EXTRACTION W/INTRAOCULAR LENS IMPLANT RIGHT EYE FOLLOWED BY LEFT EYE  2. When is this surgery scheduled? 08/01/18  3. What type of clearance is required (medical clearance vs. Pharmacy clearance to hold med vs. Both)? BOTH  4. Are there any medications that need to be held prior to surgery and how long? [place]  5. Practice name and name of physician performing surgery? [facility]  6. DR BEVIS  7. What is your office phone number [phone]   7.   What is your office fax number [phone]  8.   Anesthesia type (None, local, MAC, general) ? CHOICE   Devra Dopp 06/22/2018, 8:26 AM  _________________________________________________________________   (provider comments below)

## 2018-06-25 ENCOUNTER — Encounter: Payer: Self-pay | Admitting: Internal Medicine

## 2018-06-28 NOTE — Telephone Encounter (Signed)
   Primary Cardiologist: Nanetta Batty, MD  Chart reviewed as part of pre-operative protocol coverage. Cataract extractions are recognized in guidelines as low risk surgeries that do not typically require specific preoperative testing or holding of blood thinner therapy. Therefore, given past medical history and time since last visit, based on ACC/AHA guidelines, KAHNE HELFAND would be at acceptable risk for the planned procedure without further cardiovascular testing.   I will route this recommendation to the requesting party via Epic fax function and remove from pre-op pool.  Please call with questions.  Laurann Montana, PA-C 06/28/2018, 3:03 PM

## 2018-07-05 ENCOUNTER — Encounter: Payer: Self-pay | Admitting: Internal Medicine

## 2018-07-05 DIAGNOSIS — K6389 Other specified diseases of intestine: Secondary | ICD-10-CM

## 2018-07-05 DIAGNOSIS — K638219 Small intestinal bacterial overgrowth, unspecified: Secondary | ICD-10-CM

## 2018-07-05 HISTORY — DX: Other specified diseases of intestine: K63.89

## 2018-07-05 HISTORY — DX: Small intestinal bacterial overgrowth, unspecified: K63.8219

## 2018-07-06 ENCOUNTER — Telehealth: Payer: Self-pay | Admitting: Internal Medicine

## 2018-07-06 MED ORDER — AMOXICILLIN-POT CLAVULANATE 875-125 MG PO TABS: 1.0000 | ORAL_TABLET | Freq: Two times a day (BID) | ORAL | 0 refills | Status: DC

## 2018-07-06 NOTE — Telephone Encounter (Signed)
Iva Boop, MD sent to Annett Fabian, RN        Call him and explain that breath test indicates SIBO   That means could be cause of his diarrhea and gas problems   Rx Augmentin 875 mg bid x 10 days   He should contact us with an update 1 month after rx taken to see how he is doing and determine f/u or other w/u or tx    Left message for patient to call back

## 2018-07-06 NOTE — Telephone Encounter (Signed)
Patient notified of the results and recommendations.  rx sent 

## 2018-07-06 NOTE — Telephone Encounter (Signed)
Left message for patient to call back  

## 2018-07-07 ENCOUNTER — Telehealth: Payer: Self-pay | Admitting: *Deleted

## 2018-07-07 DIAGNOSIS — G4733 Obstructive sleep apnea (adult) (pediatric): Secondary | ICD-10-CM

## 2018-07-07 NOTE — Telephone Encounter (Signed)
RE: CPAP PROBLEMS  Turner, Brandon Bryant, MD  Reesa Chew, CMA        First get a download and also needs to see PCP for SOB

## 2018-07-07 NOTE — Telephone Encounter (Signed)
Hello Dr Mayford Knife, Brandon Soto patient saw the respiratory therapist today at [facility] and she faxed [place] over a letter on behalf of the patient stating he complains of not getting a good nights sleep along with several other symptoms including shortness of breath and respiratory problems. The therapist says the patient inquired about getting a loaner auto-cpap to conduct an auto titration. Please advise

## 2018-07-07 NOTE — Telephone Encounter (Signed)
Download sent to be scanned for your viewing and patient will see Dr Allyson Sabal on 12/20 for SOB.

## 2018-07-07 NOTE — Telephone Encounter (Signed)
Reached out to the patient  And let him know that I have made the doctor aware of his cpap problem. Patient notified and is agreeable to treatment.

## 2018-07-12 NOTE — Telephone Encounter (Addendum)
Per [facility] please write 2 week auto titration order for patient to see if his pressure should be altered. He states he feels horrible because he feels his pressures are not working for him. Please advise

## 2018-07-13 NOTE — Telephone Encounter (Signed)
Place on auto CPAP from 4 to 18cm H2O and get download in 2 weeks

## 2018-07-14 NOTE — Telephone Encounter (Signed)
Order placed to [facility] for Place on auto CPAP from 4 to 18cm H2O and get download in 2 weeks

## 2018-07-14 NOTE — Addendum Note (Signed)
Addended by: Reesa Chew on: 07/14/2018 05:32 PM   Modules accepted: Orders

## 2018-07-15 ENCOUNTER — Encounter: Payer: Self-pay | Admitting: Cardiovascular Disease

## 2018-07-15 ENCOUNTER — Ambulatory Visit: Payer: Medicare Other | Admitting: Cardiovascular Disease

## 2018-07-15 VITALS — BP 126/68 | HR 55 | Ht 73.0 in | Wt 249.2 lb

## 2018-07-15 DIAGNOSIS — I480 Paroxysmal atrial fibrillation: Secondary | ICD-10-CM | POA: Diagnosis not present

## 2018-07-15 DIAGNOSIS — R002 Palpitations: Secondary | ICD-10-CM

## 2018-07-15 DIAGNOSIS — I2510 Atherosclerotic heart disease of native coronary artery without angina pectoris: Secondary | ICD-10-CM | POA: Insufficient documentation

## 2018-07-15 DIAGNOSIS — R0602 Shortness of breath: Secondary | ICD-10-CM

## 2018-07-15 DIAGNOSIS — I10 Essential (primary) hypertension: Secondary | ICD-10-CM

## 2018-07-15 DIAGNOSIS — G4733 Obstructive sleep apnea (adult) (pediatric): Secondary | ICD-10-CM

## 2018-07-15 NOTE — Telephone Encounter (Signed)
Please order an Airsense with auto CPAP from 4 to 18cm H2O with heated humidity and get a 2 week download.  Patient's machine is broken and he needs a new device.

## 2018-07-15 NOTE — Progress Notes (Signed)
07/15/2018 TIMMOTHY BARANOWSKI   January 01, 1946  [id number]  Primary Physician Wilburn Mylar, MD Primary Cardiologist: Runell Gess MD FACP, [place], [place], [place]  HPI:  Brandon Soto is a 72 y.o.  mildly overweight, married, Caucasian male father of 2, grandfather to 3 grandchildren who I last saw in the office  07/14/2017.[name] He has a history of CAD status post LAD intervention by Dr. Jorje Guild in 2001 in the setting of a myocardial infarction. I catheterization him in 2004 revealing a patent stent with an anteroapical wall motion abnormality and EF of 45-50%. His other problems include hypertension, hyperlipidemia, and hypothyroidism. He denies chest pain or shortness of breath. His last Myoview performed in October of 2011 showed apical scar, and echo showed a normal EF. His most recent lab work revealed a total cholesterol of 146, LDL of 72, and HDL 49.he had a left total knee replacement performed by Dr. Hadassah Pais 06/27/12 which was uncomplicated. He denies chest pain or shortness of breath. His primary care physician, Rita Ohara, follows his lipid profile closely. He had a recent stroke apparently in 2 vascular territories. Workup has been unrevealing including carotid Dopplers which were essentially normal and a transthoracic echo that did not show an embolic source. Dr. Hillis Range placed a loop recorder to rule out paroxysmal atrial fibrillation as a cause. He was originally placed on aspirin and Plavix however the loop recorder did reveal short bursts of A. fib and he was switched to Xarelto. Healso has had a sleep studyand has been placed on C Pap followed by Dr. Mayford Knife. Since I saw him a year ago he denies chest pain or shortness of breath.    Current Meds  Medication Sig  . acetaminophen (TYLENOL) 325 MG tablet Take 650 mg by mouth every 6 (six) hours as needed for mild pain. For pain in hands  . amoxicillin-clavulanate (AUGMENTIN) 875-125 MG tablet Take 1 tablet by mouth 2 (two)  times daily.  [name] esomeprazole (NEXIUM) 40 MG capsule Take 1 capsule (40 mg total) by mouth 2 (two) times daily before a meal.  . levothyroxine (SYNTHROID, LEVOTHROID) 175 MCG tablet Take 175 mcg by mouth daily before breakfast.  . losartan (COZAAR) 50 MG tablet TAKE 1 TABLET BY MOUTH  DAILY  . Multiple Vitamin (MULTIVITAMIN WITH MINERALS) TABS Take 1 tablet by mouth daily.  [name] omega-3 acid ethyl esters (LOVAZA) 1 G capsule Take 2 g by mouth daily. Taking the [facility] brand  . rivaroxaban (XARELTO) 20 MG TABS tablet Take 1 tablet (20 mg total) by mouth daily with supper.  . rosuvastatin (CRESTOR) 20 MG tablet TAKE 1 TABLET BY MOUTH AT  BEDTIME  . tamsulosin (FLOMAX) 0.4 MG CAPS capsule Take 0.4 mg by mouth daily.     No Known Allergies  Social History   Socioeconomic History  . Marital status: Married    Spouse name: Not on file  . Number of children: 2  . Years of education: Not on file  . Highest education level: Not on file  Occupational History  . Occupation: retired  Engineer, production  . Financial resource strain: Not on file  . Food insecurity:    Worry: Not on file    Inability: Not on file  . Transportation needs:    Medical: Not on file    Non-medical: Not on file  Tobacco Use  . Smoking status: Never Smoker  . Smokeless tobacco: Never Used  Substance and Sexual Activity  .  Alcohol use: No    Alcohol/week: 0.0 standard drinks  . Drug use: No  . Sexual activity: Yes  Lifestyle  . Physical activity:    Days per week: Not on file    Minutes per session: Not on file  . Stress: Not on file  Relationships  . Social connections:    Talks on phone: Not on file    Gets together: Not on file    Attends religious service: Not on file    Active member of club or organization: Not on file    Attends meetings of clubs or organizations: Not on file    Relationship status: Not on file  . Intimate partner violence:    Fear of current or ex partner: Not on file    Emotionally abused:  Not on file    Physically abused: Not on file    Forced sexual activity: Not on file  Other Topics Concern  . Not on file  Social History Narrative   Married and retired, 2 children   Stays busy keeping up his [place] home plus he has a home in [place] and takes care of his mother-in-law's home   No alcohol tobacco or drug use     Review of Systems: General: negative for chills, fever, night sweats or weight changes.  Cardiovascular: negative for chest pain, dyspnea on exertion, edema, orthopnea, palpitations, paroxysmal nocturnal dyspnea or shortness of breath Dermatological: negative for rash Respiratory: negative for cough or wheezing Urologic: negative for hematuria Abdominal: negative for nausea, vomiting, diarrhea, bright red blood per rectum, melena, or hematemesis Neurologic: negative for visual changes, syncope, or dizziness All other systems reviewed and are otherwise negative except as noted above.    Blood pressure 126/68, pulse (!) 55, height 6\' 1"  (1.854 m), weight 249 lb 3.2 oz (113 kg).  General appearance: alert and no distress Neck: no adenopathy, no carotid bruit, no JVD, supple, symmetrical, trachea midline and thyroid not enlarged, symmetric, no tenderness/mass/nodules Lungs: clear to auscultation bilaterally Heart: regular rate and rhythm, S1, S2 normal, no murmur, click, rub or gallop Extremities: extremities normal, atraumatic, no cyanosis or edema Pulses: 2+ and symmetric Skin: Skin color, texture, turgor normal. No rashes or lesions Neurologic: Alert and oriented X 3, normal strength and tone. Normal symmetric reflexes. Normal coordination and gait  EKG sinus bradycardia 55 with type II second-degree AV block and septal Q waves.  I personally reviewed this EKG.  ASSESSMENT AND PLAN:   Hypertension History of essential hypertension blood pressure measured today 126/68.  He is on losartan.  Continue current meds at current  dosing.  Hypercholesteremia History of hyperlipidemia on statin therapy followed by his PCP  Second degree AV block History of Herbie Saxon which he has on his EKG today  OSA (obstructive sleep apnea) History of obstructive sleep apnea on CPAP which he benefits from  PAF (paroxysmal atrial fibrillation) (HCC) History of PAF documented by loop recorder implantation after he had a cryptogenic stroke.  He is on Xarelto oral anticoagulation.  He complains of increasing nocturnal tachypalpitations associated with shortness of breath.  And will get event monitor to further evaluate.  Coronary artery disease History of CAD status post LAD intervention by Dr. Jorje Guild in 2001 in the setting of a STEMI.  I catheterized him in 2004 revealing a patent stent with anterior apical wall motion abnormality and EF of 45 to 50%.  He did have a Myoview performed October 2011 that showed apical scar with normal  EF.  He complains of occasional shortness of breath but no chest pain.  I will get a 2D echo to further evaluate.      Runell Gess MD FACP,FACC,FAHA, [facility] 07/15/2018 10:22 AM

## 2018-07-15 NOTE — Assessment & Plan Note (Signed)
History of obstructive sleep apnea on CPAP which he benefits from 

## 2018-07-15 NOTE — Assessment & Plan Note (Signed)
History of hyperlipidemia on statin therapy followed by his PCP 

## 2018-07-15 NOTE — Assessment & Plan Note (Signed)
History of CAD status post LAD intervention by Dr. Jorje Guild in 2001 in the setting of a STEMI.  I catheterized him in 2004 revealing a patent stent with anterior apical wall motion abnormality and EF of 45 to 50%.  He did have a Myoview performed October 2011 that showed apical scar with normal EF.  He complains of occasional shortness of breath but no chest pain.  I will get a 2D echo to further evaluate.

## 2018-07-15 NOTE — Telephone Encounter (Signed)
Hello Dr Mayford Knife, the patient wants to know if he will get a new cpap after the 2 week auto titration because right now his machine is broken and he can not get a new one because he got that one 12/10/14 unless the provider documents his machine is nonfunctional.

## 2018-07-15 NOTE — Patient Instructions (Addendum)
Medication Instructions:  Your physician recommends that you continue on your current medications as directed. Please refer to the Current Medication list given to you today.  If you need a refill on your cardiac medications before your next appointment, please call your pharmacy.   Lab work: None ordered If you have labs (blood work) drawn today and your tests are completely normal, you will receive your results only by: [name] MyChart Message (if you have MyChart) OR . A paper copy in the mail If you have any lab test that is abnormal or we need to change your treatment, we will call you to review the results.  Testing/Procedures: Your physician has requested that you have an echocardiogram. Echocardiography is a painless test that uses sound waves to create images of your heart. It provides your doctor with information about the size and shape of your heart and how well your heart's chambers and valves are working. This procedure takes approximately one hour. There are no restrictions for this procedure.  Your physician has recommended that you wear an event monitor. Event monitors are medical devices that record the heart's electrical activity. Doctors most often [place] these monitors to diagnose arrhythmias. Arrhythmias are problems with the speed or rhythm of the heartbeat. The monitor is a small, portable device. You can wear one while you do your normal daily activities. This is usually used to diagnose what is causing palpitations/syncope (passing out).    Follow-Up: At [facility], you and your health needs are our priority.  As part of our continuing mission to provide you with exceptional heart care, we have created designated Provider Care Teams.  These Care Teams include your primary Cardiologist (physician) and Advanced Practice Providers (APPs -  Physician Assistants and Nurse Practitioners) who all work together to provide you with the care you need, when you need it. Your physician  recommends that you schedule a follow-up appointment in: 6 months with an APP You will need a follow up appointment in 12 months with Dr.Berry.  Please call our office 2 months in advance to schedule this appointment.  You may see Nanetta Batty, MD or one of the following Advanced Practice Providers on your designated Care Team:   Corine Shelter, PA-C Judy Pimple, [place] . Marjie Skiff, PA-C  Any Other Special Instructions Will Be Listed Below (If Applicable).

## 2018-07-15 NOTE — Assessment & Plan Note (Signed)
History of Brandon Soto which he has on his EKG today

## 2018-07-15 NOTE — Assessment & Plan Note (Signed)
History of essential hypertension blood pressure measured today 126/68.  He is on losartan.  Continue current meds at current dosing.

## 2018-07-15 NOTE — Assessment & Plan Note (Signed)
History of PAF documented by loop recorder implantation after he had a cryptogenic stroke.  He is on Xarelto oral anticoagulation.  He complains of increasing nocturnal tachypalpitations associated with shortness of breath.  And will get event monitor to further evaluate.

## 2018-07-18 NOTE — Addendum Note (Signed)
Addended by: Reesa Chew on: 07/18/2018 10:16 AM   Modules accepted: Orders

## 2018-07-26 ENCOUNTER — Ambulatory Visit (HOSPITAL_COMMUNITY): Payer: Medicare Other | Attending: Cardiology

## 2018-07-26 ENCOUNTER — Other Ambulatory Visit: Payer: Self-pay

## 2018-07-26 DIAGNOSIS — I480 Paroxysmal atrial fibrillation: Secondary | ICD-10-CM | POA: Diagnosis not present

## 2018-07-26 DIAGNOSIS — R0602 Shortness of breath: Secondary | ICD-10-CM | POA: Insufficient documentation

## 2018-07-26 DIAGNOSIS — R002 Palpitations: Secondary | ICD-10-CM | POA: Diagnosis not present

## 2018-08-03 ENCOUNTER — Other Ambulatory Visit: Payer: Self-pay | Admitting: Cardiovascular Disease

## 2018-08-08 ENCOUNTER — Other Ambulatory Visit (HOSPITAL_COMMUNITY): Payer: Medicare Other

## 2018-08-15 ENCOUNTER — Telehealth: Payer: Self-pay | Admitting: *Deleted

## 2018-08-15 NOTE — Telephone Encounter (Signed)
Called patient lmtcb on home phone.

## 2018-08-15 NOTE — Telephone Encounter (Signed)
-----   Message from Quintella Reichert, MD sent at 08/11/2018  8:53 AM EST ----- Please find out if patient feels better on auto PAP and also find out if patient needs new order for new device

## 2018-08-15 NOTE — Telephone Encounter (Signed)
Patient states that no the machine is not working good at all but [facility] called today for him to come and pick up the unit he paid out of pocket to have fixed.  Patient states [facility] told him that he was not eligible for a new device yet.

## 2018-10-03 ENCOUNTER — Telehealth: Payer: Self-pay | Admitting: Cardiovascular Disease

## 2018-10-03 DIAGNOSIS — I10 Essential (primary) hypertension: Secondary | ICD-10-CM

## 2018-10-03 NOTE — Telephone Encounter (Signed)
Pressure could be elevated due to flu symptoms.  Have him take extra 50 mg losartan x 2 days (100 mg total), then resume with 50 mg daily.  Watch home BP no more than twice daily for next 7-10 days and call back if elevated in 10 days.

## 2018-10-03 NOTE — Telephone Encounter (Signed)
Returned to call patient Kristin's advice given.

## 2018-10-03 NOTE — Telephone Encounter (Signed)
Returned call to patient he stated he is getting over the flu.Stated for the past 3 to 4 days his B/P has been elevated.Stated he was given fluzone,benzonatate,and [facility] brand cough syrup.B/P readings listed below.Pulse [id number].Stated he takes Losartan 50 mg daily and has not missed any doses. Advised I will send message to pharmacy for advice.

## 2018-10-03 NOTE — Telephone Encounter (Signed)
Pt c/o BP issue: STAT if pt c/o blurred vision, one-sided weakness or slurred speech  1. What are your last 5 BP readings? 164/96, 170/90 last night, 145/89,  2. Are you having any other symptoms (ex. Dizziness, headache, blurred vision, passed out)? Headaches   3. What is your BP issue? The concern is that is going up.

## 2018-10-07 MED ORDER — LOSARTAN POTASSIUM 100 MG PO TABS: 100.0000 mg | ORAL_TABLET | Freq: Every day | ORAL | 3 refills | Status: DC

## 2018-10-07 NOTE — Telephone Encounter (Signed)
Spoke to patient Brandon Soto's recommendation given.Bmet in 1 week lab order mailed.Appointment scheduled with pharmacist 10/27/18 at 9:00 am.Advised to bring B/P readings and all medication.Advised to call sooner if needed.

## 2018-10-07 NOTE — Telephone Encounter (Signed)
Returned call to patient he stated his B/P is still elevated readings listed below.He is over flu.He took extra losartan like he was told and still elevated.Advised I will send message back to pharmacy for advice.

## 2018-10-07 NOTE — Telephone Encounter (Signed)
Please increase losartan to 100mg  daily, avoid all decongestants , continue low sodium in diet, stay hydrated, and repeat BMET in 1 week.  Will like to assess renal function and fluids status prior to additional recommendations.  Okay to schedule f/u in 2 weeks with APP or PharmD if patient agreeable.   Losartan dose change may take few days to show significant effect. Please call back if BP continues to go up or new symptoms noted.

## 2018-10-07 NOTE — Addendum Note (Signed)
Addended by: Neoma Laming on: 10/07/2018 03:26 PM   Modules accepted: Orders

## 2018-10-07 NOTE — Telephone Encounter (Signed)
° ° °  Pt c/o BP issue: STAT if pt c/o blurred vision, one-sided weakness or slurred speech  1. What are your last 5 BP readings? 160/94 HR 76, 157/96 HR 74, 168/82 HR 62  2. Are you having any other symptoms (ex. Dizziness, headache, blurred vision, passed out)? NO  3. What is your BP issue? Patient recovering from the flu, concerned about BP being higher than normal

## 2018-10-13 LAB — BASIC METABOLIC PANEL
BUN/Creatinine Ratio: 17 (ref 10–24)
BUN: 13 mg/dL (ref 8–27)
CHLORIDE: 101 mmol/L (ref 96–106)
CO2: 19 mmol/L — AB (ref 20–29)
Calcium: 9.4 mg/dL (ref 8.6–10.2)
Creatinine, Ser: 0.77 mg/dL (ref 0.76–1.27)
GFR calc Af Amer: 104 mL/min/{1.73_m2} (ref >59)
GFR calc non Af Amer: 90 mL/min/{1.73_m2} (ref >59)
GLUCOSE: 179 mg/dL — AB (ref 65–99)
Potassium: 4.3 mmol/L (ref 3.5–5.2)
Sodium: 138 mmol/L (ref 134–144)

## 2018-10-20 ENCOUNTER — Telehealth: Payer: Self-pay | Admitting: Pharmacist

## 2018-10-20 NOTE — Telephone Encounter (Signed)
LMOM: Appt with HTN on 10/27/2018 - cancelled. Will call back to schedule virtual or tele-visit as soon as possible.  Encouraged patient to call back if questions and to confirm he received this message.

## 2018-10-25 NOTE — Telephone Encounter (Signed)
Patient's wife [name] returned called. She said [place]  She does have questions about his BP meds.

## 2018-10-25 NOTE — Telephone Encounter (Signed)
BP home readings:  3/26: 153/89 (71) ,137/77 (69) 3/27: 154/86 (72), 142/80 (62) 3/28; 148/81 (64), 151/74(66) 3/29: 149/85 (72), 153/77 (77) 3/31: 150/81 (69)  *No dizziness, no SOB, no frequent HA's*  Recommendation: Continue all medication as prescribed. We will call back to schedule virtual visit or phone visit ASAP.

## 2018-10-27 ENCOUNTER — Ambulatory Visit: Payer: Medicare Other

## 2018-11-21 ENCOUNTER — Encounter: Payer: Self-pay | Admitting: Pharmacist Clinician (PhC)/ Clinical Pharmacy Specialist

## 2018-11-22 ENCOUNTER — Other Ambulatory Visit: Payer: Self-pay | Admitting: Pharmacist Clinician (PhC)/ Clinical Pharmacy Specialist

## 2018-11-22 ENCOUNTER — Telehealth: Payer: Self-pay | Admitting: Pharmacist Clinician (PhC)/ Clinical Pharmacy Specialist

## 2018-11-22 MED ORDER — AMLODIPINE BESYLATE 5 MG PO TABS: 5.0000 mg | ORAL_TABLET | Freq: Every day | ORAL | 5 refills | Status: DC

## 2018-11-22 NOTE — Telephone Encounter (Signed)
Patient returned call in response to MyChart message from 4/27.  He was originally scheduled to have an appointment with [facility], which was cancelled due to COVID-19.  We spoke for approximately 15 minutes today.   He has had treated hypertension for about 10 years, and has always been well controlled on just losartan 100 mg qd.  His medical history is significant for hypertension, hyperlipidemia, second degree AV block, PAF and CAD.  Currently he takes just the losartan 100 mg each morning.    Most recent labs  10/14/18:    Na 138, K 4.3, Glu 179, BUN 13, SCr 0.77, GFR 90  Home readings were read from 4/14 thru this am.  15 am readings/8 pm readings  AM:  Average 148/80 HR 62  Range 129-158/72-88    PM: average 141/78 HR 67   Range  131-147/74-81  Patient has no concerns for chest pains, shortness of breath, edema or dizziness.  He is compliant with medication.  He has been doing well overall, but they have had family stress in the past week as his mother-in-law died from COVID while living at a local nursing facility.    Will have him start amlodipine 5 mg once daily, in the evenings.  He will continue with regular home BP monitoring and we will reach out to him in 2-3 weeks to see how he is doing.

## 2018-11-22 NOTE — Telephone Encounter (Signed)
Follow Up:; ° ° °Returning your call. °

## 2018-12-07 ENCOUNTER — Other Ambulatory Visit: Payer: Self-pay | Admitting: Cardiovascular Disease

## 2018-12-08 ENCOUNTER — Encounter: Payer: Self-pay | Admitting: Pharmacist Clinician (PhC)/ Clinical Pharmacy Specialist

## 2018-12-12 ENCOUNTER — Telehealth: Payer: Self-pay | Admitting: Pharmacist Clinician (PhC)/ Clinical Pharmacy Specialist

## 2018-12-12 DIAGNOSIS — Z5181 Encounter for therapeutic drug level monitoring: Secondary | ICD-10-CM

## 2018-12-12 DIAGNOSIS — I10 Essential (primary) hypertension: Secondary | ICD-10-CM

## 2018-12-12 NOTE — Telephone Encounter (Signed)
Patient would like to speak to you about his BP reads for the last two weeks.

## 2018-12-13 MED ORDER — AMLODIPINE BESYLATE 10 MG PO TABS: 10.0000 mg | ORAL_TABLET | Freq: Every day | ORAL | 3 refills | Status: DC

## 2018-12-13 NOTE — Telephone Encounter (Signed)
Spoke with patient regarding home BP readings for past few weeks.  He gave me readings from the past 7 days, essentially unchanged from prior to starting the amlodipine.  Does have a pattern of dropping to the low 140's both morning and evening, for the past 3-4 days.  Patient reeling well, no CP, SOB or LEE.   Notes that he just received refill in the mail of his losartan, but has about 8 days on the amlodipine.  Wanted to talk to [facility] before re-ordering.    Will increase the amlodipine to 10 mg daily.  Patient can continue with the 5 mg until new rx arrives in mail.  Rx sent to [facility].  He will continue to monitor home BP readings and we will reach out to him in mid June for follow up.  Patient voiced understanding.

## 2019-01-02 NOTE — Telephone Encounter (Signed)
  Patient started having some swelling last Friday in lower legs an ankles. He was to update Erasmo Downer since he was put on the new medication per previous conversations

## 2019-01-03 MED ORDER — CHLORTHALIDONE 25 MG PO TABS: 12.5000 mg | ORAL_TABLET | Freq: Every day | ORAL | 3 refills | Status: DC

## 2019-01-03 NOTE — Telephone Encounter (Signed)
Spoke with patient, he notes home BP readings improved since increasing amlodipine to 10 mg, however has had lower extremity edema for several days now.  Home BP readings in past week have been 329-191 systolic.    Will have him cut the amlodipine back to 5 mg qd and add chlorthalidone 12.5 mg once daily.  He will continue with the losartan.  He will come by the office in 10-14 days for repeat BMET to ensure he is tolerating this.

## 2019-01-03 NOTE — Addendum Note (Signed)
Addended by: Rockne Menghini on: 01/03/2019 01:30 PM   Modules accepted: Orders

## 2019-01-17 LAB — BASIC METABOLIC PANEL
BUN/Creatinine Ratio: 18 (ref 10–24)
BUN: 17 mg/dL (ref 8–27)
CO2: 22 mmol/L (ref 20–29)
Calcium: 9.8 mg/dL (ref 8.6–10.2)
Chloride: 96 mmol/L (ref 96–106)
Creatinine, Ser: 0.93 mg/dL (ref 0.76–1.27)
GFR calc Af Amer: 94 mL/min/{1.73_m2} (ref >59)
GFR calc non Af Amer: 81 mL/min/{1.73_m2} (ref >59)
Glucose: 180 mg/dL — ABNORMAL HIGH (ref 65–99)
Potassium: 3.7 mmol/L (ref 3.5–5.2)
Sodium: 136 mmol/L (ref 134–144)

## 2019-01-17 NOTE — Addendum Note (Signed)
Addended by: Earvin Hansen on: 01/17/2019 08:36 AM   Modules accepted: Orders

## 2019-01-17 NOTE — Telephone Encounter (Signed)
Patient at office for labs, bmet orders place

## 2019-02-22 ENCOUNTER — Telehealth: Payer: Self-pay | Admitting: Cardiovascular Disease

## 2019-02-22 NOTE — Telephone Encounter (Signed)
LMOM; patient to call back and discuss BP reading and/or concerns.

## 2019-02-22 NOTE — Telephone Encounter (Signed)
New Message     Pt is calling in reference to his BP and is asking to speak with Erasmo Downer  He says she was suppose to call him around the middle of the month and has not

## 2019-02-28 ENCOUNTER — Telehealth: Payer: Self-pay | Admitting: Pharmacist Clinician (PhC)/ Clinical Pharmacy Specialist

## 2019-02-28 DIAGNOSIS — I10 Essential (primary) hypertension: Secondary | ICD-10-CM

## 2019-02-28 NOTE — Telephone Encounter (Signed)
LMOM - advised pt to call if he has any BP concerns

## 2019-02-28 NOTE — Telephone Encounter (Signed)
Patient called regarding BP readings.  State all have been very good.  !81-025'G systolic, with HR in the 86-28 bpm range.  Only concern is that he feet and ankles have started swelling again.    Currently taking amlodipine 10 mg, chlorthalidone 12.5 mg and losartan 100 mg daily  Will have him cut amlodipine down to 5 mg daily and increase chlorthalidone to 25 mg.  He will need a repeat BMET in 2 weeks.    Patient voiced understanding.

## 2019-03-13 ENCOUNTER — Other Ambulatory Visit: Payer: Self-pay | Admitting: Pharmacist Clinician (PhC)/ Clinical Pharmacy Specialist

## 2019-03-13 ENCOUNTER — Other Ambulatory Visit: Payer: Self-pay

## 2019-03-13 ENCOUNTER — Telehealth: Payer: Self-pay | Admitting: *Deleted

## 2019-03-13 MED ORDER — CHLORTHALIDONE 25 MG PO TABS: 25.0000 mg | ORAL_TABLET | Freq: Every day | ORAL | 3 refills | Status: DC

## 2019-03-13 NOTE — Telephone Encounter (Signed)
Pt left message on VM stating he needs an increase on his Chlorthalidone 25mg . He states he only got #15 pills but dose recently increased to 1 pill QDand is running out. Please call pt to address @ [phone]. He stated he uses [facility] on [address].

## 2019-03-13 NOTE — Telephone Encounter (Signed)
Chlorthalidone dose increased to 25 mg qd.  Rx sent

## 2019-03-13 NOTE — Telephone Encounter (Signed)
Refill

## 2019-03-13 NOTE — Telephone Encounter (Signed)
rx changed to 1 tablet daily, rx sent.  Pt aware

## 2019-03-20 LAB — BASIC METABOLIC PANEL
BUN/Creatinine Ratio: 16 (ref 10–24)
BUN: 15 mg/dL (ref 8–27)
CO2: 22 mmol/L (ref 20–29)
Calcium: 9.4 mg/dL (ref 8.6–10.2)
Chloride: 97 mmol/L (ref 96–106)
Creatinine, Ser: 0.92 mg/dL (ref 0.76–1.27)
GFR calc Af Amer: 95 mL/min/{1.73_m2} (ref >59)
GFR calc non Af Amer: 82 mL/min/{1.73_m2} (ref >59)
Glucose: 154 mg/dL — ABNORMAL HIGH (ref 65–99)
Potassium: 3.5 mmol/L (ref 3.5–5.2)
Sodium: 139 mmol/L (ref 134–144)

## 2019-04-19 ENCOUNTER — Other Ambulatory Visit: Payer: Self-pay | Admitting: Cardiovascular Disease

## 2019-04-19 DIAGNOSIS — E7800 Pure hypercholesterolemia, unspecified: Secondary | ICD-10-CM

## 2019-04-19 DIAGNOSIS — I10 Essential (primary) hypertension: Secondary | ICD-10-CM

## 2019-04-19 DIAGNOSIS — R0683 Snoring: Secondary | ICD-10-CM

## 2019-04-19 NOTE — Telephone Encounter (Signed)
1m 113kg Scr 0.92 03/20/19 ccr 112.6 Lovw/berry 07/15/18

## 2019-07-12 ENCOUNTER — Other Ambulatory Visit: Payer: Self-pay | Admitting: Cardiovascular Disease

## 2019-07-12 DIAGNOSIS — I10 Essential (primary) hypertension: Secondary | ICD-10-CM

## 2019-07-12 DIAGNOSIS — E7800 Pure hypercholesterolemia, unspecified: Secondary | ICD-10-CM

## 2019-07-12 DIAGNOSIS — R0683 Snoring: Secondary | ICD-10-CM

## 2019-08-01 ENCOUNTER — Ambulatory Visit: Payer: Medicare Other | Admitting: Cardiovascular Disease

## 2019-08-01 ENCOUNTER — Other Ambulatory Visit: Payer: Self-pay

## 2019-08-01 ENCOUNTER — Encounter: Payer: Self-pay | Admitting: Cardiovascular Disease

## 2019-08-01 VITALS — BP 140/76 | HR 55 | Temp 98.1°F | Ht 73.0 in | Wt 252.0 lb

## 2019-08-01 DIAGNOSIS — I480 Paroxysmal atrial fibrillation: Secondary | ICD-10-CM | POA: Diagnosis not present

## 2019-08-01 DIAGNOSIS — I10 Essential (primary) hypertension: Secondary | ICD-10-CM

## 2019-08-01 DIAGNOSIS — G4733 Obstructive sleep apnea (adult) (pediatric): Secondary | ICD-10-CM

## 2019-08-01 DIAGNOSIS — R0602 Shortness of breath: Secondary | ICD-10-CM

## 2019-08-01 DIAGNOSIS — R002 Palpitations: Secondary | ICD-10-CM

## 2019-08-01 DIAGNOSIS — E7800 Pure hypercholesterolemia, unspecified: Secondary | ICD-10-CM

## 2019-08-01 DIAGNOSIS — I2510 Atherosclerotic heart disease of native coronary artery without angina pectoris: Secondary | ICD-10-CM

## 2019-08-01 NOTE — Assessment & Plan Note (Signed)
History of hyperlipidemia on rosuvastatin followed by his PCP 

## 2019-08-01 NOTE — Assessment & Plan Note (Addendum)
History of obstructive sleep apnea on CPAP. 

## 2019-08-01 NOTE — Assessment & Plan Note (Signed)
History of essential hypertension blood pressure measured today 140/76.  He is on amlodipine, chlorthalidone and losartan.

## 2019-08-01 NOTE — Assessment & Plan Note (Signed)
History of CAD status post acute myocardial infarction in 2001 status post intervention to the LAD by Dr. Jorje Guild .  I catheterized him in 2004 revealing a patent stent with anteroapical wall motion abnormality EF of 45 to 50%.  Last Myoview performed October 2011 showed apical scar without ischemia.  He denies chest pain or shortness of breath.

## 2019-08-01 NOTE — Assessment & Plan Note (Signed)
History of PAF maintaining sinus rhythm on Xarelto oral anticoagulation. °

## 2019-08-01 NOTE — Progress Notes (Signed)
08/01/2019 Brandon Soto   04-Sep-1945  [id number]  Primary Physician Wilburn Mylar, MD Primary Cardiologist: Runell Gess MD FACP, [place], [place], [place]  HPI:  Brandon Soto is a 74 y.o.  mildly overweight, married, Caucasian male father of 2, grandfather to 3 grandchildren who I last saw in the office  07/15/2018.[name] He has a history of CAD status post LAD intervention by Dr. Jorje Guild in 2001 in the setting of a myocardial infarction. I catheterization him in 2004 revealing a patent stent with an anteroapical wall motion abnormality and EF of 45-50%. His other problems include hypertension, hyperlipidemia, and hypothyroidism. He denies chest pain or shortness of breath. His last Myoview performed in October of 2011 showed apical scar, and echo showed a normal EF. His most recent lab work revealed a total cholesterol of 146, LDL of 72, and HDL 49.he had a left total knee replacement performed by Dr. Hadassah Pais 06/27/12 which was uncomplicated. He denies chest pain or shortness of breath. His primary care physician, Rita Ohara, follows his lipid profile closely. He had a recent stroke apparently in 2 vascular territories. Workup has been unrevealing including carotid Dopplers which were essentially normal and a transthoracic echo that did not show an embolic source. Dr. Hillis Range placed a loop recorder to rule out paroxysmal atrial fibrillation as a cause.He was originally placed on aspirin and Plavix however the loop recorder did reveal short bursts of A. fib and he was switched to Xarelto.Healso has had a sleep studyand has been placed on C Pap followed by Dr. Mayford Knife.  Since I saw him a year ago he is remained stable.  He denies chest pain or shortness of breath.   Current Meds  Medication Sig  . acetaminophen (TYLENOL) 325 MG tablet Take 650 mg by mouth every 6 (six) hours as needed for mild pain. For pain in hands  . amLODipine (NORVASC) 10 MG tablet Take 1 tablet (10 mg total) by  mouth daily. (Patient taking differently: Take 5 mg by mouth daily. )  . amoxicillin-clavulanate (AUGMENTIN) 875-125 MG tablet Take 1 tablet by mouth 2 (two) times daily.  . chlorthalidone (HYGROTON) 25 MG tablet Take 1 tablet (25 mg total) by mouth daily.  [name] esomeprazole (NEXIUM) 40 MG capsule Take 1 capsule (40 mg total) by mouth 2 (two) times daily before a meal.  . gabapentin (NEURONTIN) 600 MG tablet Take 600 mg by mouth 3 (three) times daily.  [name] levothyroxine (SYNTHROID, LEVOTHROID) 175 MCG tablet Take 175 mcg by mouth daily before breakfast.  . Multiple Vitamin (MULTIVITAMIN WITH MINERALS) TABS Take 1 tablet by mouth daily.  [name] omega-3 acid ethyl esters (LOVAZA) 1 G capsule Take 2 g by mouth daily. Taking the [facility] brand  . rosuvastatin (CRESTOR) 20 MG tablet TAKE 1 TABLET BY MOUTH AT  BEDTIME  . XARELTO 20 MG TABS tablet TAKE 1 TABLET BY MOUTH  DAILY WITH SUPPER     No Known Allergies  Social History   Socioeconomic History  . Marital status: Married    Spouse name: Not on file  . Number of children: 2  . Years of education: Not on file  . Highest education level: Not on file  Occupational History  . Occupation: retired  Tobacco Use  . Smoking status: Never Smoker  . Smokeless tobacco: Never Used  Substance and Sexual Activity  . Alcohol use: No    Alcohol/week: 0.0 standard drinks  . Drug use: No  .  Sexual activity: Yes  Other Topics Concern  . Not on file  Social History Narrative   Married and retired, 2 children   Stays busy keeping up his [place] home plus he has a home in [place] and takes care of his mother-in-law's home   No alcohol tobacco or drug use   Social Determinants of Corporate investment banker Strain:   . Difficulty of Paying Living Expenses: Not on file  Food Insecurity:   . Worried About Programme researcher, broadcasting/film/video in the Last Year: Not on file  . Ran Out of Food in the Last Year: Not on file  Transportation Needs:   . Lack of  Transportation (Medical): Not on file  . Lack of Transportation (Non-Medical): Not on file  Physical Activity:   . Days of Exercise per Week: Not on file  . Minutes of Exercise per Session: Not on file  Stress:   . Feeling of Stress : Not on file  Social Connections:   . Frequency of Communication with Friends and Family: Not on file  . Frequency of Social Gatherings with Friends and Family: Not on file  . Attends Religious Services: Not on file  . Active Member of Clubs or Organizations: Not on file  . Attends Banker Meetings: Not on file  . Marital Status: Not on file  Intimate Partner Violence:   . Fear of Current or Ex-Partner: Not on file  . Emotionally Abused: Not on file  . Physically Abused: Not on file  . Sexually Abused: Not on file     Review of Systems: General: negative for chills, fever, night sweats or weight changes.  Cardiovascular: negative for chest pain, dyspnea on exertion, edema, orthopnea, palpitations, paroxysmal nocturnal dyspnea or shortness of breath Dermatological: negative for rash Respiratory: negative for cough or wheezing Urologic: negative for hematuria Abdominal: negative for nausea, vomiting, diarrhea, bright red blood per rectum, melena, or hematemesis Neurologic: negative for visual changes, syncope, or dizziness All other systems reviewed and are otherwise negative except as noted above.    Blood pressure 140/76, pulse (!) 55, temperature 98.1 F (36.7 C), height 6\' 1"  (1.854 m), weight 252 lb (114.3 kg), SpO2 98 %.  General appearance: alert and no distress Neck: no adenopathy, no carotid bruit, no JVD, supple, symmetrical, trachea midline and thyroid not enlarged, symmetric, no tenderness/mass/nodules Lungs: clear to auscultation bilaterally Heart: regular rate and rhythm, S1, S2 normal, no murmur, click, rub or gallop Extremities: extremities normal, atraumatic, no cyanosis or edema Pulses: 2+ and symmetric Skin: Skin  color, texture, turgor normal. No rashes or lesions Neurologic: Alert and oriented X 3, normal strength and tone. Normal symmetric reflexes. Normal coordination and gait  EKG sinus bradycardia 55 sinus arrhythmia PACs and Mobitz second-degree heart block.  I personally reviewed this EKG.  ASSESSMENT AND PLAN:   Hypertension History of essential hypertension blood pressure measured today 140/76.  He is on amlodipine, chlorthalidone and losartan.  Hypercholesteremia History of hyperlipidemia on rosuvastatin followed by his PCP  OSA (obstructive sleep apnea) History of obstructive sleep apnea on CPAP  PAF (paroxysmal atrial fibrillation) (HCC) History of PAF maintaining sinus rhythm on Xarelto oral anticoagulation  Coronary artery disease History of CAD status post acute myocardial infarction in 2001 status post intervention to the LAD by Dr. 2002 .  I catheterized him in 2004 revealing a patent stent with anteroapical wall motion abnormality EF of 45 to 50%.  Last Myoview performed October 2011 showed apical  scar without ischemia.  He denies chest pain or shortness of breath.      Lorretta Harp MD FACP,FACC,FAHA, [facility] 08/01/2019 10:20 AM

## 2019-08-01 NOTE — Patient Instructions (Signed)
Medication Instructions:  Your physician recommends that you continue on your current medications as directed. Please refer to the Current Medication list given to you today.  If you need a refill on your cardiac medications before your next appointment, please call your pharmacy.   Lab work: NONE  Testing/Procedures: NONE  Follow-Up: At [facility], you and your health needs are our priority.  As part of our continuing mission to provide you with exceptional heart care, we have created designated Provider Care Teams.  These Care Teams include your primary Cardiologist (physician) and Advanced Practice Providers (APPs -  Physician Assistants and Nurse Practitioners) who all work together to provide you with the care you need, when you need it. You may see Jonathan Berry, MD  or one of the following Advanced Practice Providers on your designated Care Team:    Luke Kilroy, PA-C  Callie Goodrich, PA-C  Jesse Cleaver, FNP  Your physician wants you to follow-up in: 1 year with Dr. Berry      

## 2019-08-19 ENCOUNTER — Ambulatory Visit: Payer: Medicare Other | Attending: Internal Medicine

## 2019-08-19 DIAGNOSIS — Z23 Encounter for immunization: Secondary | ICD-10-CM

## 2019-08-19 NOTE — Progress Notes (Signed)
   Covid-19 Vaccination Clinic  Name:  ELDEAN NANNA    MRN: [id number] DOB: [birth year]  08/19/2019  Mr. Bhat was observed post Covid-19 immunization for 15 minutes without incidence. He was provided with Vaccine Information Sheet and instruction to access the V-Safe system.   Mr. Lortie was instructed to call 911 with any severe reactions post vaccine: [name] Difficulty breathing  . Swelling of your face and throat  . A fast heartbeat  . A bad rash all over your body  . Dizziness and weakness    Immunizations Administered    Name Date Dose VIS Date Route   Pfizer COVID-19 Vaccine 08/19/2019  1:32 PM 0.3 mL 07/07/2019 Intramuscular   Manufacturer: [employer], [employer]   Lot: [id number]   NDC: [address]-4

## 2019-08-22 ENCOUNTER — Other Ambulatory Visit: Payer: Self-pay | Admitting: Cardiovascular Disease

## 2019-08-22 MED ORDER — CHLORTHALIDONE 25 MG PO TABS: 25.0000 mg | ORAL_TABLET | Freq: Every day | ORAL | 3 refills | Status: DC

## 2019-08-22 NOTE — Telephone Encounter (Signed)
*  STAT* If patient is at the pharmacy, call can be transferred to refill team.   1. Which medications need to be refilled? (please list name of each medication and dose if known) chlorthalidone (HYGROTON) 25 MG tablet  2. Which pharmacy/location (including street and city if local pharmacy) is medication to be sent to? [facility] SERV[facility] - [place] - [address]  3. Do they need a 30 day or 90 day supply? 90 day

## 2019-09-09 ENCOUNTER — Ambulatory Visit: Payer: Medicare Other | Attending: Internal Medicine

## 2019-09-09 DIAGNOSIS — Z23 Encounter for immunization: Secondary | ICD-10-CM | POA: Insufficient documentation

## 2019-09-09 NOTE — Progress Notes (Signed)
   Covid-19 Vaccination Clinic  Name:  Brandon Soto    MRN: [id number] DOB: [birth year]  09/09/2019  Mr. Stage was observed post Covid-19 immunization for 15 minutes without incidence. He was provided with Vaccine Information Sheet and instruction to access the V-Safe system.   [name] was instructed to call 911 with any severe reactions post vaccine: [name] Difficulty breathing  . Swelling of your face and throat  . A fast heartbeat  . A bad rash all over your body  . Dizziness and weakness    Immunizations Administered    Name Date Dose VIS Date Route   Pfizer COVID-19 Vaccine 09/09/2019 10:52 AM 0.3 mL 07/07/2019 Intramuscular   Manufacturer: [employer], [employer]   Lot: [id number]   NDC: [address]-3

## 2019-10-06 ENCOUNTER — Telehealth: Payer: Self-pay | Admitting: Cardiovascular Disease

## 2019-10-06 NOTE — Telephone Encounter (Signed)
°  STAT if HR is under 50 or over 120 (normal HR is 60-100 beats per minute)  1) What is your heart rate? For 4 days HR been really low ranging from 40 - 41  2) Do you have a log of your heart rate readings (document readings)? Ranging between 40 -41  3) Do you have any other symptoms? BP is fine, with a little SOB and feel sluggish

## 2019-10-06 NOTE — Telephone Encounter (Signed)
Spoke with patient. Patient calling because he is concerned that his HR has been 40-41 the last few days. Patient reports SOB at 3-5am when using his CPAP and reports he has been sluggish since gaining weight and not exercising. Patient advised to call the physician who manages his CPAP to discuss the shortness of breath with CPAP use. Patient is asymptomatic with the bradycardia. No dizziness, lightheadedness or chest pain. Patient does not feel like he is going to pass out. BP 135/70. Patient would like to discuss with a provider his bradycardia. Patient placed on schedule Monday morning at 8:15am with Edd Fabian, NP. Patient advised to continue to monitor his BP and HR and to call back with any symptoms or changes. Patient verbalized understanding.

## 2019-10-08 NOTE — Progress Notes (Signed)
Virtual Visit via Telephone Note   This visit type was conducted due to national recommendations for restrictions regarding the COVID-19 Pandemic (e.g. social distancing) in an effort to limit this patient's exposure and mitigate transmission in our community.  Due to his co-morbid illnesses, this patient is at least at moderate risk for complications without adequate follow up.  This format is felt to be most appropriate for this patient at this time.  The patient did not have access to video technology/had technical difficulties with video requiring transitioning to audio format only (telephone).  All issues noted in this document were discussed and addressed.  No physical exam could be performed with this format.  Please refer to the patient's chart for his  consent to telehealth for [facility].  Evaluation Performed:  Follow-up visit  This visit type was conducted due to national recommendations for restrictions regarding the COVID-19 Pandemic (e.g. social distancing).  This format is felt to be most appropriate for this patient at this time.  All issues noted in this document were discussed and addressed.  No physical exam was performed (except for noted visual exam findings with Video Visits).  Please refer to the patient's chart (MyChart message for video visits and phone note for telephone visits) for the patient's consent to telehealth for [facility]  Date:  10/09/2019   ID:  Brandon Soto, DOB 1945/12/24, MRN [id number]  Patient Location:  [address] [place] [id number]   Provider location:   [facility] [address] [place], [place] [address]  PCP:  Wilburn Mylar, MD  Cardiologist:  Nanetta Batty, MD  Electrophysiologist:  None   Chief Complaint: Bradycardia  History of Present Illness:    Brandon Soto is a 74 y.o. male who presents via audio/video conferencing for a telehealth visit today.  Patient verified DOB and  address.  The patient does not symptoms concerning for COVID-19 infection (fever, chills, cough, or new SHORTNESS OF BREATH).   Mr. Hinderman has a past medical history of coronary artery disease with PCI to his LAD 2001 in the setting of MI.  He again had cardiac catheterization in 2004 which revealed a patent stent with an anterior apical wall abnormality and EF of 45-50%.  His PMH also includes HTN, HLD, CVA and hypothyroidism.  He had a Myoview 10/11 that showed apical scar, and echocardiogram showed a normal EF.  He has also had a left total knee replacement 12/13 which was uncomplicated.  His PCP follows his lipid profile.  His carotid Dopplers were normal and a transthoracic echocardiogram did not show embolic source of his CVA.  Dr. Johney Frame placed a loop recorder to rule out paroxysmal atrial fibrillation.  He had been originally on aspirin and Plavix however bursts of A. fib were shown on the loop recorder and he was transitioned to Xarelto.  He underwent sleep evaluation which showed OSA and he was placed on CPAP.  His OSA is followed by Dr. Mayford Knife.  He was last seen by Dr. Allyson Sabal on 08/01/2019.  During that time he was stable.  He denied chest pain and shortness of breath.  He contacted the nurse triage line on 10/06/2019.  He had been experiencing bradycardia 40-41 bpm.  He is having shortness of breath with his CPAP and has noticed he has felt sluggish since his recent weight gain and lack of exercise.  He was advised to call Dr. Mayford Knife for CPAP evaluation.  His  blood pressure at that time was 135/70.  He presents virtually today for follow-up and states he feels well today.  He has been concerned about his heart rate.  On 09/26/2019-10/06/2019 his heart rates were consistently in 40s.  His blood pressures were in the 120s-130 systolic.  He did not have any dizziness, lightheadedness, fatigue, or feelings of fainting.  He states that over the last few months he has noticed an increasing shortness of breath  with light to moderate activities like yard work.  He also states that he has gained some weight and knows that he has not been as physically active as he previously was.  He also states that he has some occasional chest pain in the mornings when he wakes up around 3 or 4 to use the restroom and takes off his CPAP.  He states that the chest pain usually lasts for a few seconds and dissipates.  I will order an echocardiogram for further evaluation and have him follow-up after the test.  Today he denies chest pain, shortness of breath, lower extremity edema, fatigue, palpitations, melena, hematuria, hemoptysis, diaphoresis, weakness, presyncope, syncope, orthopnea, and PND.   Prior CV studies:   The following studies were reviewed today:  EKG 08/01/2019 Sinus rhythm with second-degree AV block Mobitz type I nonspecific ST and T wave abnormality 55 bpm  Echocardiogram 07/26/2018 Study Conclusions   - Left ventricle: The cavity size was normal. Wall thickness was  increased in a pattern of mild LVH. Systolic function was normal.  The estimated ejection fraction was in the range of 55% to 60%.  There is hypokinesis of the apicalanteroseptal myocardium.  Features are consistent with a pseudonormal left ventricular  filling pattern, with concomitant abnormal relaxation and  increased filling pressure (grade 2 diastolic dysfunction).  - Aortic valve: There was mild regurgitation.  - Mitral valve: There was mild regurgitation.  - Right ventricle: The cavity size was moderately dilated. Wall  thickness was normal.  - Right atrium: The atrium was moderately dilated.  - Pulmonary arteries: Systolic pressure was mildly to moderately  increased. PA peak pressure: 49 mm Hg (S).  Past Medical History:  Diagnosis Date  . BPH (benign prostatic hyperplasia)   . Cataract    bilateral  . Coronary artery disease    s/p LAD intervention 2001  . CVA (cerebral infarction)   . Dyslipidemia     . GERD (gastroesophageal reflux disease)   . Hypertension   . Hypothyroidism    "shrank w/radiation ?1990's" (06/27/2012)  . Left knee DJD   . Myocardial infarction (HCC)   . OSA (obstructive sleep apnea) 11/29/2014  . Osteoarthritis   . PONV (postoperative nausea and vomiting)   . Sleep apnea    cpap  . Small intestinal bacterial overgrowth 07/05/2018  . Stroke [facility])    Past Surgical History:  Procedure Laterality Date  . angioplasty N/A   . CARDIAC CATHETERIZATION  ~ 2009  . CHOLECYSTECTOMY  ~ 2006  . COLONOSCOPY  06/2014  . CORONARY ANGIOPLASTY    . CORONARY ANGIOPLASTY WITH STENT PLACEMENT  2001   "1" (06/27/2012)  . 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 LOOP RECORDER IMPLANT N/A 12/15/2013   Procedure: LOOP RECORDER IMPLANT;  Surgeon: Gardiner Rhyme, MD;  Location: [facility] CATH LAB;  Service: Cardiovascular;  Laterality: N/A;  . MM CORP SCREENING MAMMO BIL/CAD ([facility] HX)    . PARTIAL KNEE ARTHROPLASTY  06/27/2012  Procedure: UNICOMPARTMENTAL KNEE;  Surgeon: Nilda Simmer, MD;  Location: [facility] OR;  Service: Orthopedics;  Laterality: Left;  left unicompartmental knee  . SHOULDER ARTHROSCOPY WITH ROTATOR CUFF REPAIR AND SUBACROMIAL DECOMPRESSION Left 2005   (06/27/2012)  . stent placement N/A   . TEE WITHOUT CARDIOVERSION N/A 01/03/2014   Procedure: TRANSESOPHAGEAL ECHOCARDIOGRAM (TEE);  Surgeon: Thurmon Fair, MD;  Location: [facility] ENDOSCOPY;  Service: Cardiovascular;  Laterality: N/A;     Current Meds  Medication Sig  . acetaminophen (TYLENOL) 325 MG tablet Take 650 mg by mouth every 6 (six) hours as needed for mild pain. For pain in hands  . amLODipine (NORVASC) 10 MG tablet Take 5 mg by mouth daily.  . B Complex Vitamins (VITAMIN B COMPLEX) TABS Take 1 tablet by mouth daily.  . chlorthalidone (HYGROTON) 25 MG tablet Take 1 tablet (25 mg total) by mouth daily.  [name] esomeprazole (NEXIUM) 40 MG capsule Take 1 capsule (40 mg total) by  mouth 2 (two) times daily before a meal.  . gabapentin (NEURONTIN) 600 MG tablet Take 600 mg by mouth 3 (three) times daily.  [name] levothyroxine (SYNTHROID, LEVOTHROID) 175 MCG tablet Take 175 mcg by mouth daily before breakfast.  . losartan (COZAAR) 100 MG tablet Take 1 tablet (100 mg total) by mouth daily.  . Multiple Vitamin (MULTIVITAMIN WITH MINERALS) TABS Take 1 tablet by mouth daily.  [name] omega-3 acid ethyl esters (LOVAZA) 1 G capsule Take 2 g by mouth daily. Taking the [facility] brand  . rosuvastatin (CRESTOR) 20 MG tablet TAKE 1 TABLET BY MOUTH AT  BEDTIME  . tamsulosin (FLOMAX) 0.4 MG CAPS capsule Take 1 capsule by mouth daily.  Carlena Hurl 20 MG TABS tablet TAKE 1 TABLET BY MOUTH  DAILY WITH SUPPER     Allergies:   Patient has no known allergies.   Social History   Tobacco Use  . Smoking status: Never Smoker  . Smokeless tobacco: Never Used  Substance Use Topics  . Alcohol use: No    Alcohol/week: 0.0 standard drinks  . Drug use: No     Family Hx: The patient's family history includes Cancer - Lung in his brother; Esophageal cancer in his brother; Heart attack in his mother; Heart disease in his brother; Liver cancer in his brother.  ROS:   Please see the history of present illness.     All other systems reviewed and are negative.   Labs/Other Tests and Data Reviewed:    Recent Labs: 03/20/2019: BUN 15; Creatinine, Ser 0.92; Potassium 3.5; Sodium 139   Recent Lipid Panel Lab Results  Component Value Date/Time   CHOL 101 (L) 07/09/2015 08:24 AM   TRIG 99 07/09/2015 08:24 AM   HDL 39 (L) 07/09/2015 08:24 AM   CHOLHDL 2.6 07/09/2015 08:24 AM   LDLCALC 42 07/09/2015 08:24 AM    Wt Readings from Last 3 Encounters:  10/09/19 252 lb (114.3 kg)  08/01/19 252 lb (114.3 kg)  07/15/18 249 lb 3.2 oz (113 kg)     Exam:    Vital Signs:  BP 122/75   Pulse 64   Ht 6\' 1"  (1.854 m)   Wt 252 lb (114.3 kg)   BMI 33.25 kg/m    Well nourished, well developed male in no  acute  distress.   ASSESSMENT & PLAN:    1.  Bradycardia/DOE -heart rate today is 64.  BP 122/75.  No recent presyncope, syncope, palpitations, chest pain.  Has had some increased dyspnea with exertion and at bedtime.  Order echocardiogram  Hypertension-BP today 122/75 Heart healthy low-sodium diet-salty 6 given Increase physical activity as tolerated Continue amlodipine 5 mg daily Continue chlorthalidone 25 mg daily Continue losartan 100 mg daily Heart healthy low-sodium diet-salty 6 given Increase physical activity as tolerated Weight loss  Paroxysmal atrial fibrillation-observed via loop recorder.  No recent episodes of palpitations. Continue Xarelto 20 mg daily Avoid triggers caffeine, chocolate, EtOH etc. Increase physical activity as tolerated  Hyperlipidemia-LDL 42 07/09/2015 Continue rosuvastatin 20 mg daily Heart healthy low-sodium high-fiber diet Increase physical activity as tolerated Followed by PCP  COVID-19 Education: The signs and symptoms of COVID-19 were discussed with the patient and how to seek care for testing (follow up with PCP or arrange E-visit).  The importance of social distancing was discussed today.  Patient Risk:   After full review of this patients clinical status, I feel that they are at least moderate risk at this time.  Time:   Today, I have spent 15 minutes with the patient with telehealth technology discussing heart rate, blood pressure, DOE, weight loss, medication, echocardiogram.     Medication Adjustments/Labs and Tests Ordered: Current medicines are reviewed at length with the patient today.  Concerns regarding medicines are outlined above.   Tests Ordered: No orders of the defined types were placed in this encounter.  Medication Changes: No orders of the defined types were placed in this encounter.   Disposition:  in 1 month(s)  Signed,  Jossie Ng. [facility] [address] Office [phone] Fax [phone]

## 2019-10-09 ENCOUNTER — Other Ambulatory Visit: Payer: Self-pay | Admitting: Internal Medicine

## 2019-10-09 ENCOUNTER — Telehealth (INDEPENDENT_AMBULATORY_CARE_PROVIDER_SITE_OTHER): Payer: Medicare Other | Admitting: General Practice

## 2019-10-09 ENCOUNTER — Encounter: Payer: Self-pay | Admitting: General Practice

## 2019-10-09 VITALS — BP 122/75 | HR 64 | Ht 73.0 in | Wt 252.0 lb

## 2019-10-09 DIAGNOSIS — R001 Bradycardia, unspecified: Secondary | ICD-10-CM

## 2019-10-09 DIAGNOSIS — R0600 Dyspnea, unspecified: Secondary | ICD-10-CM | POA: Diagnosis not present

## 2019-10-09 DIAGNOSIS — I10 Essential (primary) hypertension: Secondary | ICD-10-CM | POA: Diagnosis not present

## 2019-10-09 DIAGNOSIS — E785 Hyperlipidemia, unspecified: Secondary | ICD-10-CM | POA: Diagnosis not present

## 2019-10-09 DIAGNOSIS — E7800 Pure hypercholesterolemia, unspecified: Secondary | ICD-10-CM

## 2019-10-09 DIAGNOSIS — I480 Paroxysmal atrial fibrillation: Secondary | ICD-10-CM | POA: Diagnosis not present

## 2019-10-09 DIAGNOSIS — R0609 Other forms of dyspnea: Secondary | ICD-10-CM

## 2019-10-09 NOTE — Patient Instructions (Addendum)
Medication Instructions:  Your physician recommends that you continue on your current medications as directed. Please refer to the Current Medication list given to you today.  *If you need a refill on your cardiac medications before your next appointment, please call your pharmacy*   Lab Work: NONE If you have labs (blood work) drawn today and your tests are completely normal, you will receive your results only by: [name] MyChart Message (if you have MyChart) OR . A paper copy in the mail If you have any lab test that is abnormal or we need to change your treatment, we will call you to review the results.   Testing/Procedures: Your physician has requested that you have an echocardiogram. Echocardiography is a painless test that uses sound waves to create images of your heart. It provides your doctor with information about the size and shape of your heart and how well your heart's chambers and valves are working. This procedure takes approximately one hour. There are no restrictions for this procedure.  Follow-Up: At [facility], you and your health needs are our priority.  As part of our continuing mission to provide you with exceptional heart care, we have created designated Provider Care Teams.  These Care Teams include your primary Cardiologist (physician) and Advanced Practice Providers (APPs -  Physician Assistants and Nurse Practitioners) who all work together to provide you with the care you need, when you need it.  We recommend signing up for the patient portal called "MyChart".  Sign up information is provided on this After Visit Summary.  MyChart is used to connect with patients for Virtual Visits (Telemedicine).  Patients are able to view lab/test results, encounter notes, upcoming appointments, etc.  Non-urgent messages can be sent to your provider as well.   To learn more about what you can do with MyChart, go to [email or web address].    Your next appointment:   After  echocardiogram is complete  The format for your next appointment:   Either In Person or Virtual  Provider:    You may see Nanetta Batty, MD or Edd Fabian, FNP    Other Instructions 1. CONTINUE TO MONITOR YOU BLOOD PRESSURE AND HEART RATE ONE TIME THROUGH OUT THE DAY.  2. INCREASE YOU PHYSICAL ACTIVITY AS TOLERATED.     Heart-Healthy Eating Plan Heart-healthy meal planning includes:  Eating less unhealthy fats.  Eating more healthy fats.  Making other changes in your diet. Talk with your doctor or a diet specialist (dietitian) to create an eating plan that is right for you.  What are tips for following this plan? Cooking Avoid frying your food. Try to bake, boil, grill, or broil it instead. You can also reduce fat by:  Removing the skin from poultry.  Removing all visible fats from meats.  Steaming vegetables in water or broth. Meal planning   At meals, divide your plate into four equal parts: ? Fill one-half of your plate with vegetables and green salads. ? Fill one-fourth of your plate with whole grains. ? Fill one-fourth of your plate with lean protein foods.  Eat 4-5 servings of vegetables per day. A serving of vegetables is: ? 1 cup of raw or cooked vegetables. ? 2 cups of raw leafy greens.  Eat 4-5 servings of fruit per day. A serving of fruit is: ? 1 medium whole fruit. ?  cup of dried fruit. ?  cup of fresh, frozen, or canned fruit. ?  cup of 100% fruit juice.  Eat more foods that have  soluble fiber. These are apples, broccoli, carrots, beans, peas, and barley. Try to get 20-30 g of fiber per day.  Eat 4-5 servings of nuts, legumes, and seeds per week: ? 1 serving of dried beans or legumes equals  cup after being cooked. ? 1 serving of nuts is  cup. ? 1 serving of seeds equals 1 tablespoon. General information  Eat more home-cooked food. Eat less restaurant, buffet, and fast food.  Limit or avoid alcohol.  Limit foods that are high  in starch and sugar.  Avoid fried foods.  Lose weight if you are overweight.  Keep track of how much salt (sodium) you eat. This is important if you have high blood pressure. Ask your doctor to tell you more about this.  Try to add vegetarian meals each week. Fats  Choose healthy fats. These include olive oil and canola oil, flaxseeds, walnuts, almonds, and seeds.  Eat more omega-3 fats. These include salmon, mackerel, sardines, tuna, flaxseed oil, and ground flaxseeds. Try to eat fish at least 2 times each week.  Check food labels. Avoid foods with trans fats or high amounts of saturated fat.  Limit saturated fats. ? These are often found in animal products, such as meats, butter, and cream. ? These are also found in plant foods, such as palm oil, palm kernel oil, and coconut oil.  Avoid foods with partially hydrogenated oils in them. These have trans fats. Examples are stick margarine, some tub margarines, cookies, crackers, and other baked goods. What foods can I eat? Fruits All fresh, canned (in natural juice), or frozen fruits. Vegetables Fresh or frozen vegetables (raw, steamed, roasted, or grilled). Green salads. Grains Most grains. Choose whole wheat and whole grains most of the time. Rice and pasta, including brown rice and pastas made with whole wheat. Meats and other proteins Lean, well-trimmed beef, veal, pork, and lamb. Chicken and [place] without skin. All fish and shellfish. Wild duck, rabbit, pheasant, and venison. Egg whites or low-cholesterol egg substitutes. Dried beans, peas, lentils, and tofu. Seeds and most nuts. Dairy Low-fat or nonfat cheeses, including ricotta and mozzarella. Skim or 1% milk that is liquid, powdered, or evaporated. Buttermilk that is made with low-fat milk. Nonfat or low-fat yogurt. Fats and oils Non-hydrogenated (trans-free) margarines. Vegetable oils, including soybean, sesame, sunflower, olive, peanut, safflower, corn, canola, and  cottonseed. Salad dressings or mayonnaise made with a vegetable oil. Beverages Mineral water. Coffee and tea. Diet carbonated beverages. Sweets and desserts Sherbet, gelatin, and fruit ice. Small amounts of dark chocolate. Limit all sweets and desserts. Seasonings and condiments All seasonings and condiments. The items listed above may not be a complete list of foods and drinks you can eat. Contact a dietitian for more options. What foods should I avoid? Fruits Canned fruit in heavy syrup. Fruit in cream or butter sauce. Fried fruit. Limit coconut. Vegetables Vegetables cooked in cheese, cream, or butter sauce. Fried vegetables. Grains Breads that are made with saturated or trans fats, oils, or whole milk. Croissants. Sweet rolls. Donuts. High-fat crackers, such as cheese crackers. Meats and other proteins Fatty meats, such as hot dogs, ribs, sausage, bacon, rib-eye roast or steak. High-fat deli meats, such as salami and bologna. Caviar. Domestic duck and goose. Organ meats, such as liver. Dairy Cream, sour cream, cream cheese, and creamed cottage cheese. Whole-milk cheeses. Whole or 2% milk that is liquid, evaporated, or condensed. Whole buttermilk. Cream sauce or high-fat cheese sauce. Yogurt that is made from whole milk. Fats and oils Meat  fat, or shortening. Cocoa butter, hydrogenated oils, palm oil, coconut oil, palm kernel oil. Solid fats and shortenings, including bacon fat, salt pork, lard, and butter. Nondairy cream substitutes. Salad dressings with cheese or sour cream. Beverages Regular sodas and juice drinks with added sugar. Sweets and desserts Frosting. Pudding. Cookies. Cakes. Pies. Milk chocolate or white chocolate. Buttered syrups. Full-fat ice cream or ice cream drinks. The items listed above may not be a complete list of foods and drinks to avoid. Contact a dietitian for more information. Summary  Heart-healthy meal planning includes eating less unhealthy fats, eating  more healthy fats, and making other changes in your diet.  Eat a balanced diet. This includes fruits and vegetables, low-fat or nonfat dairy, lean protein, nuts and legumes, whole grains, and heart-healthy oils and fats. This information is not intended to replace advice given to you by your health care provider. Make sure you discuss any questions you have with your health care provider. Document Revised: 09/16/2017 Document Reviewed: 08/20/2017 Elsevier Patient Education  2020 [employer].

## 2019-10-09 NOTE — Telephone Encounter (Signed)

## 2019-10-13 ENCOUNTER — Ambulatory Visit (HOSPITAL_COMMUNITY): Payer: Medicare Other | Attending: Internal Medicine

## 2019-10-13 ENCOUNTER — Other Ambulatory Visit: Payer: Self-pay

## 2019-10-13 DIAGNOSIS — R0600 Dyspnea, unspecified: Secondary | ICD-10-CM | POA: Diagnosis not present

## 2019-10-13 DIAGNOSIS — R0609 Other forms of dyspnea: Secondary | ICD-10-CM

## 2019-10-13 DIAGNOSIS — R001 Bradycardia, unspecified: Secondary | ICD-10-CM | POA: Diagnosis not present

## 2019-10-17 ENCOUNTER — Other Ambulatory Visit: Payer: Self-pay | Admitting: Cardiovascular Disease

## 2019-10-24 ENCOUNTER — Other Ambulatory Visit (HOSPITAL_COMMUNITY): Payer: Medicare Other

## 2019-10-31 ENCOUNTER — Ambulatory Visit: Payer: Medicare Other | Admitting: Cardiovascular Disease

## 2019-10-31 ENCOUNTER — Telehealth: Payer: Self-pay | Admitting: Radiology

## 2019-10-31 ENCOUNTER — Other Ambulatory Visit: Payer: Self-pay

## 2019-10-31 ENCOUNTER — Encounter: Payer: Self-pay | Admitting: Cardiovascular Disease

## 2019-10-31 DIAGNOSIS — G4733 Obstructive sleep apnea (adult) (pediatric): Secondary | ICD-10-CM | POA: Diagnosis not present

## 2019-10-31 DIAGNOSIS — E7800 Pure hypercholesterolemia, unspecified: Secondary | ICD-10-CM

## 2019-10-31 DIAGNOSIS — I480 Paroxysmal atrial fibrillation: Secondary | ICD-10-CM

## 2019-10-31 DIAGNOSIS — E079 Disorder of thyroid, unspecified: Secondary | ICD-10-CM

## 2019-10-31 DIAGNOSIS — I10 Essential (primary) hypertension: Secondary | ICD-10-CM | POA: Diagnosis not present

## 2019-10-31 LAB — BASIC METABOLIC PANEL
BUN/Creatinine Ratio: 20 (ref 10–24)
BUN: 17 mg/dL (ref 8–27)
CO2: 24 mmol/L (ref 20–29)
Calcium: 9.8 mg/dL (ref 8.6–10.2)
Chloride: 95 mmol/L — ABNORMAL LOW (ref 96–106)
Creatinine, Ser: 0.85 mg/dL (ref 0.76–1.27)
GFR calc Af Amer: 99 mL/min/{1.73_m2} (ref >59)
GFR calc non Af Amer: 86 mL/min/{1.73_m2} (ref >59)
Glucose: 170 mg/dL — ABNORMAL HIGH (ref 65–99)
Potassium: 3.8 mmol/L (ref 3.5–5.2)
Sodium: 137 mmol/L (ref 134–144)

## 2019-10-31 LAB — CBC
Hematocrit: 40.5 % (ref 37.5–51.0)
Hemoglobin: 14.1 g/dL (ref 13.0–17.7)
MCH: 34 pg — ABNORMAL HIGH (ref 26.6–33.0)
MCHC: 34.8 g/dL (ref 31.5–35.7)
MCV: 98 fL — ABNORMAL HIGH (ref 79–97)
Platelets: 169 10*3/uL (ref 150–450)
RBC: 4.15 x10E6/uL (ref 4.14–5.80)
RDW: 12 % (ref 11.6–15.4)
WBC: 5.2 10*3/uL (ref 3.4–10.8)

## 2019-10-31 LAB — LIPID PANEL
Chol/HDL Ratio: 2.9 ratio (ref 0.0–5.0)
Cholesterol, Total: 150 mg/dL (ref 100–199)
HDL: 51 mg/dL (ref >39)
LDL Chol Calc (NIH): 70 mg/dL (ref 0–99)
Triglycerides: 174 mg/dL — ABNORMAL HIGH (ref 0–149)
VLDL Cholesterol Cal: 29 mg/dL (ref 5–40)

## 2019-10-31 LAB — HEPATIC FUNCTION PANEL
ALT: 30 IU/L (ref 0–44)
AST: 25 IU/L (ref 0–40)
Albumin: 4.3 g/dL (ref 3.7–4.7)
Alkaline Phosphatase: 51 IU/L (ref 39–117)
Bilirubin Total: 1.1 mg/dL (ref 0.0–1.2)
Bilirubin, Direct: 0.28 mg/dL (ref 0.00–0.40)
Total Protein: 6.8 g/dL (ref 6.0–8.5)

## 2019-10-31 LAB — T4, FREE: Free T4: 1.75 ng/dL (ref 0.82–1.77)

## 2019-10-31 NOTE — Patient Instructions (Signed)
Your physician recommends that you continue on your current medications as directed. Please refer to the Current Medication list given to you today.  Your physician recommends that you return for lab work in:  TODAY BMET CBC LIPID LIVER  TSH AND FREE T4   Your physician has recommended that you wear an event monitor. Event monitors are medical devices that record the heart's electrical activity. Doctors most often [place] these monitors to diagnose arrhythmias. Arrhythmias are problems with the speed or rhythm of the heartbeat. The monitor is a small, portable device. You can wear one while you do your normal daily activities. This is usually used to diagnose what is causing palpitations/syncope (passing out). 2 WEEK ZIO PATCH    Your physician recommends that you schedule a follow-up appointment in:  3 MONTHS WITH DR Allyson Sabal

## 2019-10-31 NOTE — Assessment & Plan Note (Signed)
History of hyperlipidemia on statin therapy.  We will recheck a fasting lipid liver profile. 

## 2019-10-31 NOTE — Progress Notes (Signed)
10/31/2019 Brandon Soto   Oct 23, 1945  [id number]  Primary Physician Wilburn Mylar, MD Primary Cardiologist: Runell Gess MD FACP, [place], [place], [place]  HPI:  Brandon Soto is a 74 y.o.   mildly overweight, married, Caucasian male father of 2, grandfather to 3 grandchildren who I last saw in the office  08/01/2019.[name] He has a history of CAD status post LAD intervention by Dr. Jorje Guild in 2001 in the setting of a myocardial infarction. I catheterization him in 2004 revealing a patent stent with an anteroapical wall motion abnormality and EF of 45-50%. His other problems include hypertension, hyperlipidemia, and hypothyroidism. He denies chest pain or shortness of breath. His last Myoview performed in October of 2011 showed apical scar, and echo showed a normal EF. His most recent lab work revealed a total cholesterol of 146, LDL of 72, and HDL 49.he had a left total knee replacement performed by Dr. Hadassah Pais 06/27/12 which was uncomplicated. He denies chest pain or shortness of breath. His primary care physician, Rita Ohara, follows his lipid profile closely. He had a recent stroke apparently in 2 vascular territories. Workup has been unrevealing including carotid Dopplers which were essentially normal and a transthoracic echo that did not show an embolic source. Dr. Hillis Range placed a loop recorder to rule out paroxysmal atrial fibrillation as a cause.He was originally placed on aspirin and Plavix however the loop recorder did reveal short bursts of A. fib and he was switched to Xarelto.Healso has had a sleep studyand has been placed on C Pap followed by Dr. Mayford Knife.  Since I saw him in the office 3 months ago he has noticed some increasing dyspnea on exertion and some bradycardia.  Does have a history of thyroid disease as well.  He denies chest pain.  Recent 2D echo performed 10/13/2019 revealed normal LV systolic function with grade 1 diastolic dysfunction.   Current Meds  Medication  Sig  . acetaminophen (TYLENOL) 325 MG tablet Take 650 mg by mouth every 6 (six) hours as needed for mild pain. For pain in hands  . amLODipine (NORVASC) 10 MG tablet Take 5 mg by mouth daily.  . B Complex Vitamins (VITAMIN B COMPLEX) TABS Take 1 tablet by mouth daily.  . chlorthalidone (HYGROTON) 25 MG tablet Take 1 tablet (25 mg total) by mouth daily.  [name] esomeprazole (NEXIUM) 40 MG capsule TAKE 1 CAPSULE BY MOUTH 2  TIMES DAILY BEFORE A MEAL.  [name] gabapentin (NEURONTIN) 600 MG tablet Take 600 mg by mouth 3 (three) times daily.  [name] levothyroxine (SYNTHROID, LEVOTHROID) 175 MCG tablet Take 175 mcg by mouth daily before breakfast.  . losartan (COZAAR) 100 MG tablet Take 1 tablet (100 mg total) by mouth daily.  . Multiple Vitamin (MULTIVITAMIN WITH MINERALS) TABS Take 1 tablet by mouth daily.  [name] omega-3 acid ethyl esters (LOVAZA) 1 G capsule Take 2 g by mouth daily. Taking the [facility] brand  . rosuvastatin (CRESTOR) 20 MG tablet TAKE 1 TABLET BY MOUTH AT  BEDTIME  . tamsulosin (FLOMAX) 0.4 MG CAPS capsule Take 1 capsule by mouth daily.  Carlena Hurl 20 MG TABS tablet TAKE 1 TABLET BY MOUTH  DAILY WITH SUPPER     No Known Allergies  Social History   Socioeconomic History  . Marital status: Married    Spouse name: Not on file  . Number of children: 2  . Years of education: Not on file  . Highest education level: Not on file  Occupational History  . Occupation: retired  Tobacco Use  . Smoking status: Never Smoker  . Smokeless tobacco: Never Used  Substance and Sexual Activity  . Alcohol use: No    Alcohol/week: 0.0 standard drinks  . Drug use: No  . Sexual activity: Yes  Other Topics Concern  . Not on file  Social History Narrative   Married and retired, 2 children   Stays busy keeping up his [place] home plus he has a home in [place] and takes care of his mother-in-law's home   No alcohol tobacco or drug use   Social Determinants of Radio broadcast assistant Strain:     . Difficulty of Paying Living Expenses:   Food Insecurity:   . Worried About Charity fundraiser in the Last Year:   . Arboriculturist in the Last Year:   Transportation Needs:   . Film/video editor (Medical):   [name] Lack of Transportation (Non-Medical):   Physical Activity:   . Days of Exercise per Week:   . Minutes of Exercise per Session:   Stress:   . Feeling of Stress :   Social Connections:   . Frequency of Communication with Friends and Family:   . Frequency of Social Gatherings with Friends and Family:   . Attends Religious Services:   . Active Member of Clubs or Organizations:   . Attends Archivist Meetings:   [name] Marital Status:   Intimate Partner Violence:   . Fear of Current or Ex-Partner:   . Emotionally Abused:   [name] Physically Abused:   . Sexually Abused:      Review of Systems: General: negative for chills, fever, night sweats or weight changes.  Cardiovascular: negative for chest pain, dyspnea on exertion, edema, orthopnea, palpitations, paroxysmal nocturnal dyspnea or shortness of breath Dermatological: negative for rash Respiratory: negative for cough or wheezing Urologic: negative for hematuria Abdominal: negative for nausea, vomiting, diarrhea, bright red blood per rectum, melena, or hematemesis Neurologic: negative for visual changes, syncope, or dizziness All other systems reviewed and are otherwise negative except as noted above.    Blood pressure 126/68, pulse (!) 58, height 6\' 1"  (1.854 m), weight 255 lb (115.7 kg).  General appearance: alert and no distress Neck: no adenopathy, no carotid bruit, no JVD, supple, symmetrical, trachea midline and thyroid not enlarged, symmetric, no tenderness/mass/nodules Lungs: clear to auscultation bilaterally Heart: regular rate and rhythm, S1, S2 normal, no murmur, click, rub or gallop Extremities: extremities normal, atraumatic, no cyanosis or edema Pulses: 2+ and symmetric Skin: Skin color,  texture, turgor normal. No rashes or lesions Neurologic: Alert and oriented X 3, normal strength and tone. Normal symmetric reflexes. Normal coordination and gait  EKG sinus bradycardia 50 with Mobitz second-degree AV block and septal Q waves.  I personally reviewed this EKG.  ASSESSMENT AND PLAN:   Thyroid disease He has been complaining of bradycardia recently.  We will check a TSH and free T4.  Hypertension History of essential hypertension with blood pressure measured today at 126/68.  He is on amlodipine, chlorthalidone, and losartan.  Hypercholesteremia History of hyperlipidemia on statin therapy.  We will recheck a fasting lipid liver profile.  OSA (obstructive sleep apnea) History of obstructive sleep apnea on CPAP followed by Dr. Radford Pax  PAF (paroxysmal atrial fibrillation) ([place]) History of PAF found on Linq loop recorder potentially causing previous CVA on Xarelto oral anticoagulation.  Coronary artery disease History of CAD status post LAD intervention by Dr.  Joe puma in 2001 in the setting of STEMI.  I catheterized him in 2004 revealing a patent stent with an anteroapical wall motion normality an EF of 45 to 50%.  He denies chest pain but has had more shortness of breath in the last several weeks.  Recent 2D echo performed 10/13/2019 revealed normal LV systolic function with an EF of 60 to 65% range with grade 1 diastolic dysfunction.      Runell Gess MD FACP,FACC,FAHA, [facility] 10/31/2019 8:54 AM

## 2019-10-31 NOTE — Telephone Encounter (Signed)
Enrolled patient for a 14 day Zio monitor to be mailed to patients home.  

## 2019-10-31 NOTE — Assessment & Plan Note (Signed)
History of essential hypertension with blood pressure measured today at 126/68.  He is on amlodipine, chlorthalidone, and losartan.

## 2019-10-31 NOTE — Assessment & Plan Note (Signed)
History of CAD status post LAD intervention by Dr. Jorje Guild in 2001 in the setting of STEMI.  I catheterized him in 2004 revealing a patent stent with an anteroapical wall motion normality an EF of 45 to 50%.  He denies chest pain but has had more shortness of breath in the last several weeks.  Recent 2D echo performed 10/13/2019 revealed normal LV systolic function with an EF of 60 to 65% range with grade 1 diastolic dysfunction.

## 2019-10-31 NOTE — Assessment & Plan Note (Signed)
History of obstructive sleep apnea on CPAP followed by Dr. Turner. 

## 2019-10-31 NOTE — Assessment & Plan Note (Signed)
He has been complaining of bradycardia recently.  We will check a TSH and free T4.

## 2019-10-31 NOTE — Assessment & Plan Note (Signed)
History of PAF found on Linq loop recorder potentially causing previous CVA on Xarelto oral anticoagulation.

## 2019-11-07 ENCOUNTER — Other Ambulatory Visit (INDEPENDENT_AMBULATORY_CARE_PROVIDER_SITE_OTHER): Payer: Medicare Other

## 2019-11-07 DIAGNOSIS — I480 Paroxysmal atrial fibrillation: Secondary | ICD-10-CM

## 2019-12-05 DIAGNOSIS — E119 Type 2 diabetes mellitus without complications: Secondary | ICD-10-CM | POA: Insufficient documentation

## 2019-12-06 ENCOUNTER — Telehealth: Payer: Self-pay | Admitting: Cardiovascular Disease

## 2019-12-06 ENCOUNTER — Other Ambulatory Visit: Payer: Self-pay

## 2019-12-06 ENCOUNTER — Encounter: Payer: Self-pay | Admitting: Cardiovascular Disease

## 2019-12-06 ENCOUNTER — Ambulatory Visit (INDEPENDENT_AMBULATORY_CARE_PROVIDER_SITE_OTHER): Payer: Medicare Other | Admitting: Cardiovascular Disease

## 2019-12-06 VITALS — BP 124/64 | HR 53 | Ht 73.0 in | Wt 257.0 lb

## 2019-12-06 DIAGNOSIS — I442 Atrioventricular block, complete: Secondary | ICD-10-CM | POA: Diagnosis not present

## 2019-12-06 DIAGNOSIS — R001 Bradycardia, unspecified: Secondary | ICD-10-CM

## 2019-12-06 NOTE — Progress Notes (Signed)
12/06/2019 DASH CARDARELLI   07/16/1946  [id number]  Primary Physician Delilah Shan, MD Primary Cardiologist: Lorretta Harp MD Lupe Carney, [place]  HPI:  Brandon Soto is a 74 y.o.  mildly overweight, married, Caucasian male father of 2, grandfather to 3 grandchildren who I last saw in the office4/12/2019.Brandon Soto He has a history of CAD status post LAD intervention by Dr. Chalmers Cater in 2001 in the setting of a myocardial infarction. I catheterization him in 2004 revealing a patent stent with an anteroapical wall motion abnormality and EF of 45-50%. His other problems include hypertension, hyperlipidemia, and hypothyroidism. He denies chest pain or shortness of breath. His last Myoview performed in October of 2011 showed apical scar, and echo showed a normal EF. His most recent lab work revealed a total cholesterol of 146, LDL of 72, and HDL 49.he had a left total knee replacement performed by Dr. Moshe Salisbury 06/27/12 which was uncomplicated. He denies chest pain or shortness of breath. His primary care physician, Brandon Soto, follows his lipid profile closely. He had a recent stroke apparently in 2 vascular territories. Workup has been unrevealing including carotid Dopplers which were essentially normal and a transthoracic echo that did not show an embolic source. Dr. Thompson Grayer placed a loop recorder to rule out paroxysmal atrial fibrillation as a cause.He was originally placed on aspirin and Plavix however the loop recorder did reveal short bursts of A. fib and he was switched to Xarelto.Healso has had a sleep studyand has been placed on C Pap followed by Dr. Radford Pax.   He has noticed some increasing dyspnea on exertion and some bradycardia.    He does have a history of thyroid disease as well.  He denies chest pain.  Recent 2D echo performed 10/13/2019 revealed normal LV systolic function with grade 1 diastolic dysfunction.  I performed an event monitoring revealing episodes of complete  heart block with pauses longest 7 seconds and multiple shorter pauses but still significant.  He is not on any negative chronotropic drugs.  He will need referral to Dr. Rayann Heman for EP evaluation.   Current Meds  Medication Sig  . acetaminophen (TYLENOL) 325 MG tablet Take 650 mg by mouth every 6 (six) hours as needed for mild pain. For pain in hands  . amLODipine (NORVASC) 10 MG tablet Take 5 mg by mouth daily.  . B Complex Vitamins (VITAMIN B COMPLEX) TABS Take 1 tablet by mouth daily.  . chlorthalidone (HYGROTON) 25 MG tablet Take 1 tablet (25 mg total) by mouth daily.  Brandon Soto esomeprazole (NEXIUM) 40 MG capsule TAKE 1 CAPSULE BY MOUTH 2  TIMES DAILY BEFORE A MEAL.  Brandon Soto gabapentin (NEURONTIN) 600 MG tablet Take 600 mg by mouth 3 (three) times daily.  Brandon Soto levothyroxine (SYNTHROID, LEVOTHROID) 175 MCG tablet Take 175 mcg by mouth daily before breakfast.  . losartan (COZAAR) 100 MG tablet Take 1 tablet (100 mg total) by mouth daily.  . Multiple Vitamin (MULTIVITAMIN WITH MINERALS) TABS Take 1 tablet by mouth daily.  Brandon Soto omega-3 acid ethyl esters (LOVAZA) 1 G capsule Take 2 g by mouth daily. Taking the [facility] brand  . rosuvastatin (CRESTOR) 20 MG tablet TAKE 1 TABLET BY MOUTH AT  BEDTIME  . XARELTO 20 MG TABS tablet TAKE 1 TABLET BY MOUTH  DAILY WITH SUPPER     No Known Allergies  Social History   Socioeconomic History  . Marital status: Married    Spouse name: Not on file  .  Number of children: 2  . Years of education: Not on file  . Highest education level: Not on file  Occupational History  . Occupation: retired  Tobacco Use  . Smoking status: Never Smoker  . Smokeless tobacco: Never Used  Substance and Sexual Activity  . Alcohol use: No    Alcohol/week: 0.0 standard drinks  . Drug use: No  . Sexual activity: Yes  Other Topics Concern  . Not on file  Social History Narrative   Married and retired, 2 children   Stays busy keeping up his [place] home plus he has a home in [place] and takes care of his mother-in-law's home   No alcohol tobacco or drug use   Social Determinants of Corporate investment banker Strain:   . Difficulty of Paying Living Expenses:   Food Insecurity:   . Worried About Programme researcher, broadcasting/film/video in the Last Year:   . Barista in the Last Year:   Transportation Needs:   . Freight forwarder (Medical):   Brandon Soto Lack of Transportation (Non-Medical):   Physical Activity:   . Days of Exercise per Week:   . Minutes of Exercise per Session:   Stress:   . Feeling of Stress :   Social Connections:   . Frequency of Communication with Friends and Family:   . Frequency of Social Gatherings with Friends and Family:   . Attends Religious Services:   . Active Member of Clubs or Organizations:   . Attends Banker Meetings:   Brandon Soto Marital Status:   Intimate Partner Violence:   . Fear of Current or Ex-Partner:   . Emotionally Abused:   Brandon Soto Physically Abused:   . Sexually Abused:      Review of Systems: General: negative for chills, fever, night sweats or weight changes.  Cardiovascular: negative for chest pain, dyspnea on exertion, edema, orthopnea, palpitations, paroxysmal nocturnal dyspnea or shortness of breath Dermatological: negative for rash Respiratory: negative for cough or wheezing Urologic: negative for hematuria Abdominal: negative for nausea, vomiting, diarrhea, bright red blood per rectum, melena, or hematemesis Neurologic: negative for visual changes, syncope, or dizziness All other systems reviewed and are otherwise negative except as noted above.    Blood pressure 124/64, pulse (!) 53, height 6\' 1"  (1.854 m), weight 257 lb (116.6 kg), SpO2 97 %.  General appearance: alert and no distress Neck: no adenopathy, no carotid bruit, no JVD, supple, symmetrical, trachea midline and thyroid not enlarged, symmetric, no tenderness/mass/nodules Lungs: clear to auscultation bilaterally Heart: regular rate and rhythm, S1,  S2 normal, no murmur, click, rub or gallop Extremities: extremities normal, atraumatic, no cyanosis or edema Pulses: 2+ and symmetric Skin: Skin color, texture, turgor normal. No rashes or lesions Neurologic: Alert and oriented X 3, normal strength and tone. Normal symmetric reflexes. Normal coordination and gait  EKG not performed today  ASSESSMENT AND PLAN:   Heart block AV complete [facility]) Mr. Machuca returns a for follow-up of a recent event monitor which I placed because of bradycardia.  He is on no negative chronotropic drugs.  He does not feel dizzy or nor has he had episodes of syncope or presyncope.  His monitor showed episodes of complete heart block with one 7-second pause and several shorter but still significant pauses.  His thyroid function tests were recently checked and were normal.  His normal LV function.  I believe he will require permanent transvenous pacing.  I am referring him to one of  our electrophysiologist this week for review and evaluation.      Runell Gess MD FACP,FACC,FAHA, [facility] 12/06/2019 2:42 PM

## 2019-12-06 NOTE — Telephone Encounter (Signed)
Left message for patient to call back, called to assess symptoms of reported event.

## 2019-12-06 NOTE — Telephone Encounter (Signed)
We just received his results back and they have been imported into his chart for Brandon Soto to read.

## 2019-12-06 NOTE — Telephone Encounter (Signed)
   Diamond from [employer] calling to give zio patch result  7.3 seconds pause due to possible high grade AV block. this is found in strip 3 and took place 11/09/19 at 4:37 am

## 2019-12-06 NOTE — Telephone Encounter (Signed)
Follow Up:       Pt is calling to see if his Monitor results are ready please?

## 2019-12-06 NOTE — Telephone Encounter (Signed)
Spoke with patient, patient coming in at 2:15pm to meet with Dr. Allyson Sabal.

## 2019-12-06 NOTE — Telephone Encounter (Signed)
Pt states that he sent his monitor back over two weeks ago and is wondering why we have not called him with the results. I let him know that as soon as the report is received and reviewed by Dr. Allyson Sabal we would be in touch. Will route to KB/SW to follow up on monitor results.

## 2019-12-06 NOTE — Assessment & Plan Note (Signed)
Brandon Soto returns a for follow-up of a recent event monitor which I placed because of bradycardia.  He is on no negative chronotropic drugs.  He does not feel dizzy or nor has he had episodes of syncope or presyncope.  His monitor showed episodes of complete heart block with one 7-second pause and several shorter but still significant pauses.  His thyroid function tests were recently checked and were normal.  His normal LV function.  I believe he will require permanent transvenous pacing.  I am referring him to one of our electrophysiologist this week for review and evaluation.

## 2019-12-06 NOTE — Patient Instructions (Signed)
Medication Instructions:  NO CHANGE *If you need a refill on your cardiac medications before your next appointment, please call your pharmacy*   Lab Work: If you have labs (blood work) drawn today and your tests are completely normal, you will receive your results only by: [name] MyChart Message (if you have MyChart) OR . A paper copy in the mail If you have any lab test that is abnormal or we need to change your treatment, we will call you to review the results.  Follow-Up: At [facility], you and your health needs are our priority.  As part of our continuing mission to provide you with exceptional heart care, we have created designated Provider Care Teams.  These Care Teams include your primary Cardiologist (physician) and Advanced Practice Providers (APPs -  Physician Assistants and Nurse Practitioners) who all work together to provide you with the care you need, when you need it.  We recommend signing up for the patient portal called "MyChart".  Sign up information is provided on this After Visit Summary.  MyChart is used to connect with patients for Virtual Visits (Telemedicine).  Patients are able to view lab/test results, encounter notes, upcoming appointments, etc.  Non-urgent messages can be sent to your provider as well.   To learn more about what you can do with MyChart, go to [email or web address].    Your next appointment:   6 month(s)  The format for your next appointment:   Either In Person or Virtual  Provider:   You may see Nanetta Batty, MD or one of the following Advanced Practice Providers on your designated Care Team:    Corine Shelter, PA-C  [place], [place]  Edd Fabian, FNP    Other Instructions  REFERRAL TO EP AT [address] THIS WEEK FOR BRADYCARDIA

## 2019-12-08 ENCOUNTER — Ambulatory Visit: Payer: Medicare Other | Admitting: Cardiovascular Disease

## 2019-12-09 DIAGNOSIS — R001 Bradycardia, unspecified: Secondary | ICD-10-CM | POA: Insufficient documentation

## 2019-12-11 ENCOUNTER — Other Ambulatory Visit: Payer: Self-pay

## 2019-12-11 ENCOUNTER — Encounter: Payer: Self-pay | Admitting: Internal Medicine

## 2019-12-11 ENCOUNTER — Ambulatory Visit: Payer: Medicare Other | Admitting: Internal Medicine

## 2019-12-11 VITALS — BP 124/60 | HR 48 | Ht 73.0 in | Wt 253.0 lb

## 2019-12-11 DIAGNOSIS — I442 Atrioventricular block, complete: Secondary | ICD-10-CM

## 2019-12-11 DIAGNOSIS — R001 Bradycardia, unspecified: Secondary | ICD-10-CM | POA: Diagnosis not present

## 2019-12-11 DIAGNOSIS — I480 Paroxysmal atrial fibrillation: Secondary | ICD-10-CM | POA: Diagnosis not present

## 2019-12-11 NOTE — Patient Instructions (Signed)
Medication Instructions:  Your physician recommends that you continue on your current medications as directed. Please refer to the Current Medication list given to you today.  *If you need a refill on your cardiac medications before your next appointment, please call your pharmacy*   Lab Work: None ordered.  If you have labs (blood work) drawn today and your tests are completely normal, you will receive your results only by: [name] MyChart Message (if you have MyChart) OR . A paper copy in the mail If you have any lab test that is abnormal or we need to change your treatment, we will call you to review the results.   Testing/Procedures: None ordered.    Follow-Up: At [facility], you and your health needs are our priority.  As part of our continuing mission to provide you with exceptional heart care, we have created designated Provider Care Teams.  These Care Teams include your primary Cardiologist (physician) and Advanced Practice Providers (APPs -  Physician Assistants and Nurse Practitioners) who all work together to provide you with the care you need, when you need it.  We recommend signing up for the patient portal called "MyChart".  Sign up information is provided on this After Visit Summary.  MyChart is used to connect with patients for Virtual Visits (Telemedicine).  Patients are able to view lab/test results, encounter notes, upcoming appointments, etc.  Non-urgent messages can be sent to your provider as well.   To learn more about what you can do with MyChart, go to [email or web address].    Your next appointment:   4 month(s)  You will receive a reminder letter to schedule your appointment.  The format for your next appointment:   In Person  Provider:   Sherryl Manges, MD

## 2019-12-11 NOTE — Progress Notes (Signed)
ELECTROPHYSIOLOGY CONSULT NOTE  Patient ID: Brandon Soto, MRN: [id number], DOB/AGE: 1945/07/28 74 y.o. Admit date: (Not on file) Date of Consult: 12/11/2019  Primary Physician: Wilburn Mylar, MD Primary Cardiologist: Santos Sollenberger is a 74 y.o. male who is being seen today for the evaluation of pauses at the request of Dr. Dorma Russell    HPI Brandon Soto is a 74 y.o. male referred because of pauses demonstrated on the monitor undertaken because of bradycardia noted by the patient on his home blood pressure machine  He has no hx of syncope or presyncope. Although some LH upon standing  History of coronary artery disease with prior stenting in 2001.  Heart catheterization 2004 demonstrated an antral wall motion abnormality with ejection fraction of 45%  DATE TEST EF   3/21 Echo   55-65 %               Date Cr K Hgb  4/21 0.85 3.8 14.1         Event Recorder personnally reviewed    Sinus rhythm with Mobitz 1 heart block Pause dependent ventricular tachycardia Complete heart block longest 7.3 seconds 0438 hrs  5.2 seconds at 0248 hrs.  Multiple episodes of nocturnal heart block Heart block note at about 1400 hrs on two days-- probably napping Sinus tach to 117 with PR interval of about 280 msec  Exercise intolerance with fatigue and some dyspnea but without chest pain    Past Medical History:  Diagnosis Date  . BPH (benign prostatic hyperplasia)   . Cataract    bilateral  . Coronary artery disease    s/p LAD intervention 2001  . CVA (cerebral infarction)   . Dyslipidemia   . GERD (gastroesophageal reflux disease)   . Hypertension   . Hypothyroidism    "shrank w/radiation ?1990's" (06/27/2012)  . Left knee DJD   . Myocardial infarction (HCC)   . OSA (obstructive sleep apnea) 11/29/2014  . Osteoarthritis   . PONV (postoperative nausea and vomiting)   . Sleep apnea    cpap  . Small intestinal bacterial overgrowth 07/05/2018  . Stroke [facility])       Surgical  History:  Past Surgical History:  Procedure Laterality Date  . angioplasty N/A   . CARDIAC CATHETERIZATION  ~ 2009  . CHOLECYSTECTOMY  ~ 2006  . COLONOSCOPY  06/2014  . CORONARY ANGIOPLASTY    . CORONARY ANGIOPLASTY WITH STENT PLACEMENT  2001   "1" (06/27/2012)  . 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 LOOP RECORDER IMPLANT N/A 12/15/2013   Procedure: LOOP RECORDER IMPLANT;  Surgeon: Gardiner Rhyme, MD;  Location: [facility] CATH LAB;  Service: Cardiovascular;  Laterality: N/A;  . MM CORP SCREENING MAMMO BIL/CAD ([facility] HX)    . PARTIAL KNEE ARTHROPLASTY  06/27/2012   Procedure: UNICOMPARTMENTAL KNEE;  Surgeon: Nilda Simmer, MD;  Location: [facility] OR;  Service: Orthopedics;  Laterality: Left;  left unicompartmental knee  . SHOULDER ARTHROSCOPY WITH ROTATOR CUFF REPAIR AND SUBACROMIAL DECOMPRESSION Left 2005   (06/27/2012)  . stent placement N/A   . TEE WITHOUT CARDIOVERSION N/A 01/03/2014   Procedure: TRANSESOPHAGEAL ECHOCARDIOGRAM (TEE);  Surgeon: Thurmon Fair, MD;  Location: [facility] ENDOSCOPY;  Service: Cardiovascular;  Laterality: N/A;     Home Meds: Current Meds  Medication Sig  . acetaminophen (TYLENOL) 325 MG tablet Take 650 mg by mouth every 6 (six) hours  as needed for mild pain. For pain in hands  . amLODipine (NORVASC) 10 MG tablet Take 5 mg by mouth daily.  . B Complex Vitamins (VITAMIN B COMPLEX) TABS Take 1 tablet by mouth daily.  . chlorthalidone (HYGROTON) 25 MG tablet Take 1 tablet (25 mg total) by mouth daily.  [name] esomeprazole (NEXIUM) 40 MG capsule TAKE 1 CAPSULE BY MOUTH 2  TIMES DAILY BEFORE A MEAL.  [name] gabapentin (NEURONTIN) 600 MG tablet Take 600 mg by mouth 3 (three) times daily.  [name] levothyroxine (SYNTHROID, LEVOTHROID) 175 MCG tablet Take 175 mcg by mouth daily before breakfast.  . losartan (COZAAR) 100 MG tablet Take 1 tablet (100 mg total) by mouth daily.  . metFORMIN (GLUCOPHAGE-XR) 500 MG 24 hr tablet Take 1,000 mg by mouth 2  (two) times daily.  . Multiple Vitamin (MULTIVITAMIN WITH MINERALS) TABS Take 1 tablet by mouth daily.  [name] omega-3 acid ethyl esters (LOVAZA) 1 G capsule Take 2 g by mouth daily. Taking the [facility] brand  . rosuvastatin (CRESTOR) 20 MG tablet TAKE 1 TABLET BY MOUTH AT  BEDTIME  . XARELTO 20 MG TABS tablet TAKE 1 TABLET BY MOUTH  DAILY WITH SUPPER    Allergies: No Known Allergies  Social History   Socioeconomic History  . Marital status: Married    Spouse name: Not on file  . Number of children: 2  . Years of education: Not on file  . Highest education level: Not on file  Occupational History  . Occupation: retired  Tobacco Use  . Smoking status: Never Smoker  . Smokeless tobacco: Never Used  Substance and Sexual Activity  . Alcohol use: No    Alcohol/week: 0.0 standard drinks  . Drug use: No  . Sexual activity: Yes  Other Topics Concern  . Not on file  Social History Narrative   Married and retired, 2 children   Stays busy keeping up his [place] home plus he has a home in [place] and takes care of his mother-in-law's home   No alcohol tobacco or drug use   Social Determinants of Radio broadcast assistant Strain:   . Difficulty of Paying Living Expenses:   Food Insecurity:   . Worried About Charity fundraiser in the Last Year:   . Arboriculturist in the Last Year:   Transportation Needs:   . Film/video editor (Medical):   [name] Lack of Transportation (Non-Medical):   Physical Activity:   . Days of Exercise per Week:   . Minutes of Exercise per Session:   Stress:   . Feeling of Stress :   Social Connections:   . Frequency of Communication with Friends and Family:   . Frequency of Social Gatherings with Friends and Family:   . Attends Religious Services:   . Active Member of Clubs or Organizations:   . Attends Archivist Meetings:   [name] Marital Status:   Intimate Partner Violence:   . Fear of Current or Ex-Partner:   . Emotionally Abused:     [name] Physically Abused:   . Sexually Abused:      Family History  Problem Relation Age of Onset  . Heart attack Mother   . Heart disease Brother   . Liver cancer Brother        deceased  . Cancer - Lung Brother        deceased  . Esophageal cancer Brother      ROS:  Please see the history  of present illness.    All other systems reviewed and negative.    Physical Exam:  Blood pressure 124/60, pulse (!) 48, height 6\' 1"  (1.854 m), weight 253 lb (114.8 kg), SpO2 95 %. General: Well developed obese white male in no acute distress. Head: Normocephalic, atraumatic, sclera non-icteric, no xanthomas, nares are without discharge. EENT: normal  Lymph Nodes:  none Neck: Negative for carotid bruits. JVD not elevated. Back:without scoliosis kyphosis  Lungs: Clear bilaterally to auscultation without wheezes, rales, or rhonchi. Breathing is unlabored. Heart: slow and irregular RR with S1 S2. No murmur . No rubs, or gallops appreciated. Abdomen: Soft, non-tender, non-distended with normoactive bowel sounds. No hepatomegaly. No rebound/guarding. No obvious abdominal masses. Msk:  Strength and tone appear normal for age. Extremities: No clubbing or cyanosis. No edema.  Distal pedal pulses are 2+ and equal bilaterally. Skin: Warm and Dry Neuro: Alert and oriented X 3. CN III-XII intact Grossly normal sensory and motor function . Psych:  Responds to questions appropriately with a normal affect.      Labs: Cardiac Enzymes No results for input(s): CKTOTAL, CKMB, TROPONINI in the last 72 hours. CBC Lab Results  Component Value Date   WBC 5.2 10/31/2019   HGB 14.1 10/31/2019   HCT 40.5 10/31/2019   MCV 98 (H) 10/31/2019   PLT 169 10/31/2019   PROTIME: No results for input(s): LABPROT, INR in the last 72 hours. Chemistry No results for input(s): NA, K, CL, CO2, BUN, CREATININE, CALCIUM, PROT, BILITOT, ALKPHOS, ALT, AST, GLUCOSE in the last 168 hours.  Invalid input(s):  LABALBU Lipids Lab Results  Component Value Date   CHOL 150 10/31/2019   HDL 51 10/31/2019   LDLCALC 70 10/31/2019   TRIG 174 (H) 10/31/2019   BNP No results found for: PROBNP Thyroid Function Tests: No results for input(s): TSH, T4TOTAL, T3FREE, THYROIDAB in the last 72 hours.  Invalid input(s): FREET3 Miscellaneous No results found for: DDIMER  Radiology/Studies:  LONG TERM MONITOR (3-14 DAYS)  Result Date: 12/06/2019 1: Sinus rhythm/sinus bradycardia/sinus tachycardia 2: Frequent PVCs with short runs of nonsustained ventricular tachycardia 3: High-grade heart block/complete heart block with pauses up to 7 seconds 4: Needs return office visit today   EKG: Sinus rhythm at 65 with Mobitz 1 heart block and a 4: 3 pattern   Assessment and Plan:  Nocturnal heart block  Mobitz 1 heart block  Dyspnea on exertion  Coronary artery disease with remote stenting   The patient has nocturnal heart block; there are episodes of heart block reported on his ZIO monitor but for which strips are not available.  We have reached out to [employer] and will review these upon their coming.  He has no symptoms of lightheadedness or presyncope apart from those associated with standing following bending.  Hence, there is no indication for pacing for heart block.  He has Mobitz 1 heart block; however, interestingly, with exertion he is able to generate sinus rhythm to about 115 bpm with a relatively short PR interval about 240 ms.  Daytime average heart rates are typically in the 80s with excursion over 100 most days. I do not think he has functional chronotropic incompetence--sinus function appears to be normal   There may be progressive bradycardia conduction system disease; however, at this point would follow without procedural intervention  His nocturnal bradycardia suggests an opportunity to improve his compliance with his known sleep apnea     02/05/2020

## 2019-12-14 ENCOUNTER — Telehealth: Payer: Medicare Other | Admitting: Internal Medicine

## 2019-12-14 ENCOUNTER — Telehealth: Payer: Self-pay | Admitting: Internal Medicine

## 2019-12-14 NOTE — Telephone Encounter (Signed)
Spoke with pt who states he is following up from his visit with Dr Graciela Husbands on Monday 12/11/2019.  Pt states Dt Graciela Husbands was to follow up with Dr Mayford Knife and the monitor company r/t the 7 second pauses his heart monitor showed.  Pt states he is concerned re:  pauses and would like to know if Dr Graciela Husbands has the additional information he needed before moving forward with possible pacemaker implantation or other treatment.

## 2019-12-14 NOTE — Telephone Encounter (Signed)
New message   Patient states that his heart is stopping up to 7 seconds.  Please call to discuss.

## 2019-12-19 ENCOUNTER — Telehealth: Payer: Self-pay | Admitting: Internal Medicine

## 2019-12-19 ENCOUNTER — Other Ambulatory Visit: Payer: Self-pay | Admitting: Cardiovascular Disease

## 2019-12-19 NOTE — Telephone Encounter (Signed)
Brandon Soto  maybe you can reach out to [facility] about these strips  she was supposed to get them to me, the daytime block strips Thanks steve

## 2019-12-19 NOTE — Telephone Encounter (Signed)
Patient is calling to follow up in regards to pacemaker readings. He states he is requesting to speak to Dr. Odessa Fleming nurse to discuss. He also has questions about a CPAP Machine. Please call.

## 2019-12-19 NOTE — Telephone Encounter (Signed)
Spoke with pt who is calling to follow up with Dr Graciela Husbands re: identified pauses.  He is waiting for Dr Graciela Husbands to discuss with Dr Mayford Knife so he will know what plan is moving forward.  He is afraid the pauses that happened at night could potentially happen during the day.  His family is concerned as well.  Pt advised will forward information to Dr Graciela Husbands for review and recommendation.

## 2019-12-19 NOTE — Telephone Encounter (Signed)
Please Inform Patient That the pauses that happen during the night, esp in the absence of symptms of LH or syncope are not likely to happen during the day Thanks

## 2019-12-26 NOTE — Telephone Encounter (Signed)
Patient is calling for monitor results.

## 2019-12-29 NOTE — Telephone Encounter (Signed)
Spoke with pt and advised per Dr Graciela Husbands that the pauses happen during the night and in the absence of symptoms the pauses are not likely to happen during the day.  Pt advised he is due for f/u with Dr Mayford Knife re: CPAP.  Pt states he will call next week to schedule appointment.  Pt verbalizes understanding and agrees with current plan.

## 2020-01-01 DIAGNOSIS — I252 Old myocardial infarction: Secondary | ICD-10-CM | POA: Insufficient documentation

## 2020-01-01 DIAGNOSIS — Z8673 Personal history of transient ischemic attack (TIA), and cerebral infarction without residual deficits: Secondary | ICD-10-CM | POA: Insufficient documentation

## 2020-01-22 NOTE — Progress Notes (Signed)
Virtual Visit via Telephone Note   This visit type was conducted due to national recommendations for restrictions regarding the COVID-19 Pandemic (e.g. social distancing) in an effort to limit this patient's exposure and mitigate transmission in our community.  Due to his co-morbid illnesses, this patient is at least at moderate risk for complications without adequate follow up.  This format is felt to be most appropriate for this patient at this time.  The patient did not have access to video technology/had technical difficulties with video requiring transitioning to audio format only (telephone).  All issues noted in this document were discussed and addressed.  No physical exam could be performed with this format.  Please refer to the patient's chart for his  consent to telehealth for [facility].   Evaluation Performed:  Follow-up visit  This visit type was conducted due to national recommendations for restrictions regarding the COVID-19 Pandemic (e.g. social distancing).  This format is felt to be most appropriate for this patient at this time.  All issues noted in this document were discussed and addressed.  No physical exam was performed (except for noted visual exam findings with Video Visits).  Please refer to the patient's chart (MyChart message for video visits and phone note for telephone visits) for the patient's consent to telehealth for [facility].  Date:  01/23/2020   ID:  Brandon Soto, DOB [birth year], MRN [id number]  Patient Location:  Home  Provider location:   [facility] he PCP:  No primary care provider on file.  Cardiologist:  Nanetta Batty, MD  Sleep Medicine:  Armanda Magic Electrophysiologist:  None   Chief Complaint:  OSA  History of Present Illness:    Brandon Soto is a 74 y.o. male who presents via audio/video conferencing for a telehealth visit today.    Brandon Soto is a 74 y.o. male with a hx of mild to moderate OSA with an AHI of 14/hr. Most events  occurred in REM non supine sleep with oxygen desaturations as low as 84%. He is on CPAP at 14cm H2O.  He is doing well with his CPAP device and thinks that he has gotten used to it.  He tolerates the mask and feels the pressure is adequate.  Since going on CPAP he feels rested in the am and has no significant daytime sleepiness.  He denies any significant mouth or nasal dryness or nasal congestion.  He does not think that he snores.    The patient does not have symptoms concerning for COVID-19 infection (fever, chills, cough, or new shortness of breath).   Prior CV studies:   The following studies were reviewed today:  PAP compliance download  Past Medical History:  Diagnosis Date  . BPH (benign prostatic hyperplasia)   . Cataract    bilateral  . Coronary artery disease    s/p LAD intervention 2001  . CVA (cerebral infarction)   . Dyslipidemia   . GERD (gastroesophageal reflux disease)   . Hypertension   . Hypothyroidism    "shrank w/radiation ?1990's" (06/27/2012)  . Left knee DJD   . Myocardial infarction (HCC)   . OSA (obstructive sleep apnea) 11/29/2014  . Osteoarthritis   . PONV (postoperative nausea and vomiting)   . Sleep apnea    cpap  . Small intestinal bacterial overgrowth 07/05/2018  . Stroke [facility])    Past Surgical History:  Procedure Laterality Date  . angioplasty N/A   . CARDIAC CATHETERIZATION  ~ 2009  . CHOLECYSTECTOMY  ~  2006  . COLONOSCOPY  06/2014  . CORONARY ANGIOPLASTY    . CORONARY ANGIOPLASTY WITH STENT PLACEMENT  2001   "1" (06/27/2012)  . 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 LOOP RECORDER IMPLANT N/A 12/15/2013   Procedure: LOOP RECORDER IMPLANT;  Surgeon: Gardiner Rhyme, MD;  [facility] C[facility] LAB;  Service: Cardiovascular;  Laterality: N/A;  . MM CORP SCREENING MAMMO BIL/CAD ([facility] HX)    . PARTIAL KNEE ARTHROPLASTY  06/27/2012   Procedure: UNICOMPARTMENTAL KNEE;  Surgeon: Nilda Simmer, MD;   Location: [facility] OR;  Service: Orthopedics;  Laterality: Left;  left unicompartmental knee  . SHOULDER ARTHROSCOPY WITH ROTATOR CUFF REPAIR AND SUBACROMIAL DECOMPRESSION Left 2005   (06/27/2012)  . stent placement N/A   . TEE WITHOUT CARDIOVERSION N/A 01/03/2014   Procedure: TRANSESOPHAGEAL ECHOCARDIOGRAM (TEE);  Surgeon: Thurmon Fair, MD;  Location: [facility] ENDOSCOPY;  Service: Cardiovascular;  Laterality: N/A;     Current Meds  Medication Sig  . acetaminophen (TYLENOL) 325 MG tablet Take 650 mg by mouth every 6 (six) hours as needed for mild pain. For pain in hands  . amLODipine (NORVASC) 10 MG tablet Take 5 mg by mouth daily.  . B Complex Vitamins (VITAMIN B COMPLEX) TABS Take 1 tablet by mouth daily.  . chlorthalidone (HYGROTON) 25 MG tablet Take 1 tablet (25 mg total) by mouth daily.  [name] esomeprazole (NEXIUM) 40 MG capsule TAKE 1 CAPSULE BY MOUTH 2  TIMES DAILY BEFORE A MEAL. (Patient taking differently: daily. )  . gabapentin (NEURONTIN) 600 MG tablet Take 600 mg by mouth 3 (three) times daily.  [name] levothyroxine (SYNTHROID, LEVOTHROID) 175 MCG tablet Take 175 mcg by mouth daily before breakfast.  . losartan (COZAAR) 100 MG tablet Take 1 tablet (100 mg total) by mouth daily.  . metFORMIN (GLUCOPHAGE-XR) 500 MG 24 hr tablet Take 1,000 mg by mouth 2 (two) times daily.  . Multiple Vitamin (MULTIVITAMIN WITH MINERALS) TABS Take 1 tablet by mouth daily.  [name] omega-3 acid ethyl esters (LOVAZA) 1 G capsule Take 1 g by mouth daily. Taking the [facility] brand   . rosuvastatin (CRESTOR) 20 MG tablet TAKE 1 TABLET BY MOUTH AT  BEDTIME  . XARELTO 20 MG TABS tablet TAKE 1 TABLET BY MOUTH  DAILY WITH SUPPER     Allergies:   Patient has no known allergies.   Social History   Tobacco Use  . Smoking status: Never Smoker  . Smokeless tobacco: Never Used  Vaping Use  . Vaping Use: Never used  Substance Use Topics  . Alcohol use: No    Alcohol/week: 0.0 standard drinks  . Drug use: No     Family Hx: The  patient's family history includes Cancer - Lung in his brother; Esophageal cancer in his brother; Heart attack in his mother; Heart disease in his brother; Liver cancer in his brother.  ROS:   Please see the history of present illness.     All other systems reviewed and are negative.   Labs/Other Tests and Data Reviewed:    Recent Labs: 10/31/2019: ALT 30; BUN 17; Creatinine, Ser 0.85; Hemoglobin 14.1; Platelets 169; Potassium 3.8; Sodium 137   Recent Lipid Panel Lab Results  Component Value Date/Time   CHOL 150 10/31/2019 09:41 AM   TRIG 174 (H) 10/31/2019 09:41 AM   HDL 51 10/31/2019 09:41 AM   CHOLHDL 2.9 10/31/2019 09:41 AM   CHOLHDL 2.6 07/09/2015 08:24 AM  LDLCALC 70 10/31/2019 09:41 AM    Wt Readings from Last 3 Encounters:  01/23/20 240 lb (108.9 kg)  12/11/19 253 lb (114.8 kg)  12/06/19 257 lb (116.6 kg)     Objective:    Vital Signs:  BP 134/74   Pulse 64   Ht 6\' 1"  (1.854 m)   Wt 240 lb (108.9 kg)   BMI 31.66 kg/m     ASSESSMENT & PLAN:    1.  OSA -  The patient is tolerating PAP therapy well without any problems. The PAP download was reviewed today and showed an AHI of 3.5/hr on 14 cm H2O with 100% compliance in using more than 4 hours nightly.  The patient has been using and benefiting from PAP use and will continue to benefit from therapy.  -His device is > 79 years old and would like a new device -I will order him a new CPAP device on 14cm H2o with heated humidity and mask of choice and followup with me in 8 weeks to document compliance per insurance requirements.   2.  HTN -BP controlled on exam -continue Losartan 100mg  daily, Chlorthalidone 25mg  daily and Amlodipine 10mg  daily  3.  Obesity -I have encouraged him to get into a routine exercise program and cut back on carbs and portions.   COVID-19 Education: He has had both COVID 19 vaccines  Patient Risk:   After full review of this patient's clinical status, I feel that they are at least  moderate risk at this time.  Time:   Today, I have spent 20 minutes on telemedicine discussing medical problems including OSA, HTN, obesity and reviewing patient's chart including PAP compliance download.  Medication Adjustments/Labs and Tests Ordered: Current medicines are reviewed at length with the patient today.  Concerns regarding medicines are outlined above.  Tests Ordered: No orders of the defined types were placed in this encounter.  Medication Changes: No orders of the defined types were placed in this encounter.   Disposition:  Follow up in 1 year(s)  Signed, 9, MD  01/23/2020 8:55 AM    [facility]

## 2020-01-23 ENCOUNTER — Other Ambulatory Visit: Payer: Self-pay

## 2020-01-23 ENCOUNTER — Encounter: Payer: Self-pay | Admitting: Cardiology

## 2020-01-23 ENCOUNTER — Telehealth: Payer: Self-pay | Admitting: *Deleted

## 2020-01-23 ENCOUNTER — Telehealth (INDEPENDENT_AMBULATORY_CARE_PROVIDER_SITE_OTHER): Payer: Medicare Other | Admitting: Cardiology

## 2020-01-23 VITALS — BP 134/74 | HR 64 | Ht 73.0 in | Wt 240.0 lb

## 2020-01-23 DIAGNOSIS — G4733 Obstructive sleep apnea (adult) (pediatric): Secondary | ICD-10-CM

## 2020-01-23 DIAGNOSIS — I10 Essential (primary) hypertension: Secondary | ICD-10-CM | POA: Diagnosis not present

## 2020-01-23 DIAGNOSIS — E669 Obesity, unspecified: Secondary | ICD-10-CM | POA: Diagnosis not present

## 2020-01-23 NOTE — Telephone Encounter (Addendum)
Order placed to [employer] via community message.  Upon patient request DME selection is [facility] Patient understands he will be contacted by [facility] to set up his cpap. Patient understands to call if [facility] does not contact him with new setup in a timely manner. Patient understands they will be called once confirmation has been received from [facility] that they have received their new machine to schedule 10 week follow up appointment.  [facility] notified of new cpap order  Please add to airview Patient was grateful for the call and thanked me.

## 2020-01-23 NOTE — Telephone Encounter (Signed)
-----   Message from Quintella Reichert, MD sent at 01/23/2020  9:02 AM EDT ----- Please order a new ResMed CPAP on 14cm H2O with heated humidity, mask of choice and supplies.  Set up visit with me in 8 weeks after getting device

## 2020-02-01 DIAGNOSIS — D6859 Other primary thrombophilia: Secondary | ICD-10-CM | POA: Insufficient documentation

## 2020-02-01 DIAGNOSIS — E1151 Type 2 diabetes mellitus with diabetic peripheral angiopathy without gangrene: Secondary | ICD-10-CM | POA: Insufficient documentation

## 2020-02-01 DIAGNOSIS — Z7901 Long term (current) use of anticoagulants: Secondary | ICD-10-CM | POA: Insufficient documentation

## 2020-02-01 DIAGNOSIS — E1142 Type 2 diabetes mellitus with diabetic polyneuropathy: Secondary | ICD-10-CM | POA: Insufficient documentation

## 2020-02-01 DIAGNOSIS — N401 Enlarged prostate with lower urinary tract symptoms: Secondary | ICD-10-CM | POA: Insufficient documentation

## 2020-03-27 NOTE — Telephone Encounter (Signed)
Patient has a 10 week follow up appointment scheduled for 04/03/20. Patient understands he needs to keep this appointment for insurance compliance. Patient was grateful for the call and thanked me.  

## 2020-03-27 NOTE — Telephone Encounter (Signed)
  Patient Consent for Virtual Visit         Brandon Soto has provided verbal consent on 03/27/2020 for a virtual visit (video or telephone).   CONSENT FOR VIRTUAL VISIT FOR:  Brandon Soto  By participating in this virtual visit I agree to the following:  I hereby voluntarily request, consent and authorize [facility] and its employed or contracted physicians, physician assistants, nurse practitioners or other licensed health care professionals (the Practitioner), to provide me with telemedicine health care services (the "Services") as deemed necessary by the treating Practitioner. I acknowledge and consent to receive the Services by the Practitioner via telemedicine. I understand that the telemedicine visit will involve communicating with the Practitioner through live audiovisual communication technology and the disclosure of certain medical information by electronic transmission. I acknowledge that I have been given the opportunity to request an in-person assessment or other available alternative prior to the telemedicine visit and am voluntarily participating in the telemedicine visit.  I understand that I have the right to withhold or withdraw my consent to the use of telemedicine in the course of my care at any time, without affecting my right to future care or treatment, and that the Practitioner or I may terminate the telemedicine visit at any time. I understand that I have the right to inspect all information obtained and/or recorded in the course of the telemedicine visit and may receive copies of available information for a reasonable fee.  I understand that some of the potential risks of receiving the Services via telemedicine include:  [name] Delay or interruption in medical evaluation due to technological equipment failure or disruption; . Information transmitted may not be sufficient (e.g. poor resolution of images) to allow for appropriate medical decision making by the Practitioner; and/or   . In rare instances, security protocols could fail, causing a breach of personal health information.  Furthermore, I acknowledge that it is my responsibility to provide information about my medical history, conditions and care that is complete and accurate to the best of my ability. I acknowledge that Practitioner's advice, recommendations, and/or decision may be based on factors not within their control, such as incomplete or inaccurate data provided by me or distortions of diagnostic images or specimens that may result from electronic transmissions. I understand that the practice of medicine is not an exact science and that Practitioner makes no warranties or guarantees regarding treatment outcomes. I acknowledge that a copy of this consent can be made available to me via my patient portal [facility] MyChart), or I can request a printed copy by calling the office of [facility].    I understand that my insurance will be billed for this visit.   I have read or had this consent read to me. . I understand the contents of this consent, which adequately explains the benefits and risks of the Services being provided via telemedicine.  . I have been provided ample opportunity to ask questions regarding this consent and the Services and have had my questions answered to my satisfaction. . I give my informed consent for the services to be provided through the use of telemedicine in my medical care

## 2020-04-02 NOTE — Progress Notes (Signed)
Virtual Visit via Telephone Note   This visit type was conducted due to national recommendations for restrictions regarding the COVID-19 Pandemic (e.g. social distancing) in an effort to limit this patient's exposure and mitigate transmission in our community.  Due to his co-morbid illnesses, this patient is at least at moderate risk for complications without adequate follow up.  This format is felt to be most appropriate for this patient at this time.  The patient did not have access to video technology/had technical difficulties with video requiring transitioning to audio format only (telephone).  All issues noted in this document were discussed and addressed.  No physical exam could be performed with this format.  Please refer to the patient's chart for his  consent to telehealth for [facility].   Evaluation Performed:  Follow-up visit  This visit type was conducted due to national recommendations for restrictions regarding the COVID-19 Pandemic (e.g. social distancing).  This format is felt to be most appropriate for this patient at this time.  All issues noted in this document were discussed and addressed.  No physical exam was performed (except for noted visual exam findings with Video Visits).  Please refer to the patient's chart (MyChart message for video visits and phone note for telephone visits) for the patient's consent to telehealth for [facility].  Date:  04/03/2020   ID:  Brandon Soto, DOB 08-Nov-1945, MRN [id number]  Patient Location:  Home  Provider location:   [place] he PCP:  Melida Quitter, MD  Cardiologist:  Nanetta Batty, MD  Sleep Medicine:  Armanda Magic Electrophysiologist:  None   Chief Complaint:  OSA  History of Present Illness:    Brandon Soto is a 74 y.o. male who presents via audio/video conferencing for a telehealth visit today.    Brandon Soto is a 74 y.o. male with a hx of mild to moderate OSA with an AHI of 14/hr. Most events occurred in REM  non supine sleep with oxygen desaturations as low as 84%. He is on CPAP at 14cm H2O.  When I last saw him he wanted a new device as his was old.  He is now back today per insurance requirements to document PAP compliance after getting a new device.   He is doing well with his CPAP device and thinks that he has gotten used to it.  He tolerates the mask and feels the pressure is adequate.  Since going on CPAP he feels rested in the am and has no significant daytime sleepiness.  He denies any significant mouth or nasal dryness or nasal congestion.  He does not think that he snores.    The patient does not have symptoms concerning for COVID-19 infection (fever, chills, cough, or new shortness of breath).   Prior CV studies:   The following studies were reviewed today:  PAP compliance download  Past Medical History:  Diagnosis Date  . BPH (benign prostatic hyperplasia)   . Cataract    bilateral  . Coronary artery disease    s/p LAD intervention 2001  . CVA (cerebral infarction)   . Dyslipidemia   . GERD (gastroesophageal reflux disease)   . Hypertension   . Hypothyroidism    "shrank w/radiation ?1990's" (06/27/2012)  . Left knee DJD   . Myocardial infarction (HCC)   . OSA (obstructive sleep apnea) 11/29/2014  . Osteoarthritis   . PONV (postoperative nausea and vomiting)   . Sleep apnea    cpap  . Small intestinal bacterial  overgrowth 07/05/2018  . Stroke [facility])    Past Surgical History:  Procedure Laterality Date  . angioplasty N/A   . CARDIAC CATHETERIZATION  ~ 2009  . CHOLECYSTECTOMY  ~ 2006  . COLONOSCOPY  06/2014  . CORONARY ANGIOPLASTY    . CORONARY ANGIOPLASTY WITH STENT PLACEMENT  2001   "1" (06/27/2012)  . 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 LOOP RECORDER IMPLANT N/A 12/15/2013   Procedure: LOOP RECORDER IMPLANT;  Surgeon: Gardiner Rhyme, MD;  Location: [facility] CATH LAB;  Service: Cardiovascular;  Laterality: N/A;  . MM  CORP SCREENING MAMMO BIL/CAD ([facility] HX)    . PARTIAL KNEE ARTHROPLASTY  06/27/2012   Procedure: UNICOMPARTMENTAL KNEE;  Surgeon: Nilda Simmer, MD;  Location: [facility] OR;  Service: Orthopedics;  Laterality: Left;  left unicompartmental knee  . SHOULDER ARTHROSCOPY WITH ROTATOR CUFF REPAIR AND SUBACROMIAL DECOMPRESSION Left 2005   (06/27/2012)  . stent placement N/A   . TEE WITHOUT CARDIOVERSION N/A 01/03/2014   Procedure: TRANSESOPHAGEAL ECHOCARDIOGRAM (TEE);  Surgeon: Thurmon Fair, MD;  Location: [facility] ENDOSCOPY;  Service: Cardiovascular;  Laterality: N/A;     Current Meds  Medication Sig  . acetaminophen (TYLENOL) 325 MG tablet Take 650 mg by mouth every 6 (six) hours as needed for mild pain. For pain in hands  . amLODipine (NORVASC) 10 MG tablet Take 5 mg by mouth daily.  . B Complex Vitamins (VITAMIN B COMPLEX) TABS Take 1 tablet by mouth daily.  . chlorthalidone (HYGROTON) 25 MG tablet Take 1 tablet (25 mg total) by mouth daily.  [name] esomeprazole (NEXIUM) 40 MG capsule TAKE 1 CAPSULE BY MOUTH 2  TIMES DAILY BEFORE A MEAL. (Patient taking differently: daily. )  . gabapentin (NEURONTIN) 600 MG tablet Take 600 mg by mouth 3 (three) times daily.  [name] levothyroxine (SYNTHROID, LEVOTHROID) 175 MCG tablet Take 200 mcg by mouth daily before breakfast.   . losartan (COZAAR) 100 MG tablet Take 1 tablet (100 mg total) by mouth daily.  . metFORMIN (GLUCOPHAGE-XR) 500 MG 24 hr tablet Take 1,000 mg by mouth 2 (two) times daily.  . Multiple Vitamin (MULTIVITAMIN WITH MINERALS) TABS Take 1 tablet by mouth daily.  [name] omega-3 acid ethyl esters (LOVAZA) 1 G capsule Take 1 g by mouth daily. Taking the [facility] brand   . rosuvastatin (CRESTOR) 20 MG tablet TAKE 1 TABLET BY MOUTH AT  BEDTIME  . XARELTO 20 MG TABS tablet TAKE 1 TABLET BY MOUTH  DAILY WITH SUPPER     Allergies:   Patient has no known allergies.   Social History   Tobacco Use  . Smoking status: Never Smoker  . Smokeless tobacco: Never Used  Vaping Use    . Vaping Use: Never used  Substance Use Topics  . Alcohol use: No    Alcohol/week: 0.0 standard drinks  . Drug use: No     Family Hx: The patient's family history includes Cancer - Lung in his brother; Esophageal cancer in his brother; Heart attack in his mother; Heart disease in his brother; Liver cancer in his brother.  ROS:   Please see the history of present illness.     All other systems reviewed and are negative.   Labs/Other Tests and Data Reviewed:    Recent Labs: 10/31/2019: ALT 30; BUN 17; Creatinine, Ser 0.85; Hemoglobin 14.1; Platelets 169; Potassium 3.8; Sodium 137   Recent Lipid Panel Lab Results  Component Value Date/Time   CHOL  150 10/31/2019 09:41 AM   TRIG 174 (H) 10/31/2019 09:41 AM   HDL 51 10/31/2019 09:41 AM   CHOLHDL 2.9 10/31/2019 09:41 AM   CHOLHDL 2.6 07/09/2015 08:24 AM   LDLCALC 70 10/31/2019 09:41 AM    Wt Readings from Last 3 Encounters:  04/03/20 237 lb (107.5 kg)  01/23/20 240 lb (108.9 kg)  12/11/19 253 lb (114.8 kg)     Objective:    Vital Signs:  BP 124/72   Pulse 60   Ht 6\' 1"  (1.854 m)   Wt 237 lb (107.5 kg)   BMI 31.27 kg/m     ASSESSMENT & PLAN:    1.  OSA - The patient is tolerating PAP therapy well without any problems. The PAP download was reviewed today and showed an AHI of 1.1/hr on 14 cm H2O with 100% compliance in using more than 4 hours nightly.  The patient has been using and benefiting from PAP use and will continue to benefit from therapy.   2.  HTN -BP controlled on exam -continue Losartan 100mg  daily, Chlorthalidone 25mg  daily and Amlodipine 10mg  daily  3.  Obesity -I have encouraged him to get into a routine exercise program and cut back on carbs and portions.   COVID-19 Education: He has had both COVID 19 vaccines  Patient Risk:   After full review of this patient's clinical status, I feel that they are at least moderate risk at this time.  Time:   Today, I have spent 20 minutes on telemedicine  discussing medical problems including OSA, HTN, obesity and reviewing patient's chart including PAP compliance download.  Medication Adjustments/Labs and Tests Ordered: Current medicines are reviewed at length with the patient today.  Concerns regarding medicines are outlined above.  Tests Ordered: No orders of the defined types were placed in this encounter.  Medication Changes: No orders of the defined types were placed in this encounter.   Disposition:  Follow up in 1 year(s)  Signed, Armanda Magic, MD  04/03/2020 9:14 AM    [facility]

## 2020-04-03 ENCOUNTER — Other Ambulatory Visit: Payer: Self-pay

## 2020-04-03 ENCOUNTER — Telehealth (INDEPENDENT_AMBULATORY_CARE_PROVIDER_SITE_OTHER): Payer: Medicare Other | Admitting: Cardiology

## 2020-04-03 ENCOUNTER — Encounter: Payer: Self-pay | Admitting: Cardiology

## 2020-04-03 VITALS — BP 124/72 | HR 60 | Ht 73.0 in | Wt 237.0 lb

## 2020-04-03 DIAGNOSIS — I10 Essential (primary) hypertension: Secondary | ICD-10-CM | POA: Diagnosis not present

## 2020-04-03 DIAGNOSIS — E669 Obesity, unspecified: Secondary | ICD-10-CM

## 2020-04-03 DIAGNOSIS — G4733 Obstructive sleep apnea (adult) (pediatric): Secondary | ICD-10-CM

## 2020-04-03 NOTE — Patient Instructions (Signed)

## 2020-04-10 DIAGNOSIS — Z9889 Other specified postprocedural states: Secondary | ICD-10-CM | POA: Insufficient documentation

## 2020-04-11 ENCOUNTER — Other Ambulatory Visit: Payer: Self-pay

## 2020-04-11 ENCOUNTER — Telehealth: Payer: Self-pay

## 2020-04-11 ENCOUNTER — Telehealth (INDEPENDENT_AMBULATORY_CARE_PROVIDER_SITE_OTHER): Payer: Medicare Other | Admitting: Internal Medicine

## 2020-04-11 VITALS — BP 125/70 | HR 56 | Ht 73.0 in | Wt 237.0 lb

## 2020-04-11 DIAGNOSIS — I442 Atrioventricular block, complete: Secondary | ICD-10-CM

## 2020-04-11 DIAGNOSIS — Z9889 Other specified postprocedural states: Secondary | ICD-10-CM

## 2020-04-11 NOTE — Progress Notes (Signed)
Electrophysiology TeleHealth Note   Due to national recommendations of social distancing due to COVID 19, an audio/video telehealth visit is felt to be most appropriate for this patient at this time.  See MyChart message from today for the patient's consent to telehealth for [facility].   Date:  04/11/2020   ID:  Brandon Soto, DOB March 29, 1946, MRN [id number]  Location: patient's home  Provider location: [address], [place]  Evaluation Performed: Follow-up visit  PCP:  Melida Quitter, MD  Cardiologist:  Nanetta Batty   Electrophysiologist:  SK   Chief Complaint:  Lightheadedness and heart block  History of Present Illness:    Brandon Soto is a 74 y.o. male who presents via audio/video conferencing for a telehealth visit today.  Since last being seen in our clinic for nocturnal pauses, Mbz 1 2AVB, remote stenting and dyspnea on exertion, the patient reports intercurrently diagnosed with DM  Hg A1c now 6.3  No LH x with bending; but no events out of the blue  Complains for days his HR can run in 40s and assoc wi weakness   History of coronary artery disease with prior stenting in 2001.  Heart catheterization 2004 demonstrated an antral wall motion abnormality with ejection fraction of 45%  DATE TEST EF   3/21 Echo   55-65 %               Date Cr K Hgb  4/21 0.85 3.8 14.1           The patient denies symptoms of fevers, chills, cough, or new SOB worrisome for COVID 19.    Past Medical History:  Diagnosis Date  . BPH (benign prostatic hyperplasia)   . Cataract    bilateral  . Coronary artery disease    s/p LAD intervention 2001  . CVA (cerebral infarction)   . Dyslipidemia   . GERD (gastroesophageal reflux disease)   . Hypertension   . Hypothyroidism    "shrank w/radiation ?1990's" (06/27/2012)  . Left knee DJD   . Myocardial infarction (HCC)   . OSA (obstructive sleep apnea) 11/29/2014  . Osteoarthritis   . PONV  (postoperative nausea and vomiting)   . Sleep apnea    cpap  . Small intestinal bacterial overgrowth 07/05/2018  . Stroke [facility])     Past Surgical History:  Procedure Laterality Date  . angioplasty N/A   . CARDIAC CATHETERIZATION  ~ 2009  . CHOLECYSTECTOMY  ~ 2006  . COLONOSCOPY  06/2014  . CORONARY ANGIOPLASTY    . CORONARY ANGIOPLASTY WITH STENT PLACEMENT  2001   "1" (06/27/2012)  . 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 LOOP RECORDER IMPLANT N/A 12/15/2013   Procedure: LOOP RECORDER IMPLANT;  Surgeon: Gardiner Rhyme, MD;  Location: [facility] CATH LAB;  Service: Cardiovascular;  Laterality: N/A;  . MM CORP SCREENING MAMMO BIL/CAD ([facility] HX)    . PARTIAL KNEE ARTHROPLASTY  06/27/2012   Procedure: UNICOMPARTMENTAL KNEE;  Surgeon: Nilda Simmer, MD;  Location: [facility] OR;  Service: Orthopedics;  Laterality: Left;  left unicompartmental knee  . SHOULDER ARTHROSCOPY WITH ROTATOR CUFF REPAIR AND SUBACROMIAL DECOMPRESSION Left 2005   (06/27/2012)  . stent placement N/A   . TEE WITHOUT CARDIOVERSION N/A 01/03/2014   Procedure: TRANSESOPHAGEAL ECHOCARDIOGRAM (TEE);  Surgeon: Thurmon Fair, MD;  Location: [facility] ENDOSCOPY;  Service: Cardiovascular;  Laterality: N/A;    Current Outpatient Medications  Medication Sig Dispense Refill  . acetaminophen (TYLENOL) 325 MG tablet Take 650 mg by mouth every 6 (six) hours as needed for mild pain. For pain in hands    . amLODipine (NORVASC) 10 MG tablet Take 5 mg by mouth daily.    . B Complex Vitamins (VITAMIN B COMPLEX) TABS Take 1 tablet by mouth daily.    . chlorthalidone (HYGROTON) 25 MG tablet Take 1 tablet (25 mg total) by mouth daily. 90 tablet 3  . esomeprazole (NEXIUM) 40 MG capsule TAKE 1 CAPSULE BY MOUTH 2  TIMES DAILY BEFORE A MEAL. (Patient taking differently: daily. ) 180 capsule 3  . gabapentin (NEURONTIN) 600 MG tablet Take 600 mg by mouth 3 (three) times daily.    [name] levothyroxine (SYNTHROID,  LEVOTHROID) 175 MCG tablet Take 200 mcg by mouth daily before breakfast.     . losartan (COZAAR) 100 MG tablet Take 1 tablet (100 mg total) by mouth daily. 90 tablet 3  . metFORMIN (GLUCOPHAGE-XR) 500 MG 24 hr tablet Take 1,000 mg by mouth 2 (two) times daily.    . Multiple Vitamin (MULTIVITAMIN WITH MINERALS) TABS Take 1 tablet by mouth daily.    [name] omega-3 acid ethyl esters (LOVAZA) 1 G capsule Take 1 g by mouth daily. Taking the [facility] brand     . rosuvastatin (CRESTOR) 20 MG tablet TAKE 1 TABLET BY MOUTH AT  BEDTIME 90 tablet 3  . XARELTO 20 MG TABS tablet TAKE 1 TABLET BY MOUTH  DAILY WITH SUPPER 90 tablet 3   No current facility-administered medications for this visit.    Allergies:   Patient has no known allergies.   Social History:  The patient  reports that he has never smoked. He has never used smokeless tobacco. He reports that he does not drink alcohol and does not use drugs.   Family History:  The patient's   family history includes Cancer - Lung in his brother; Esophageal cancer in his brother; Heart attack in his mother; Heart disease in his brother; Liver cancer in his brother.   ROS:  Please see the history of present illness.   All other systems are personally reviewed and negative.    Exam:    Vital Signs:  BP 125/70   Pulse (!) 56   Ht 6\' 1"  (1.854 m)   Wt 237 lb (107.5 kg)   BMI 31.27 kg/m     Labs/Other Tests and Data Reviewed:    Recent Labs: 10/31/2019: ALT 30; BUN 17; Creatinine, Ser 0.85; Hemoglobin 14.1; Platelets 169; Potassium 3.8; Sodium 137   Wt Readings from Last 3 Encounters:  04/11/20 237 lb (107.5 kg)  04/03/20 237 lb (107.5 kg)  01/23/20 240 lb (108.9 kg)     Other studies personally reviewed: Additional studies/ records that were reviewed today include:As above     ASSESSMENT & PLAN:   Nocturnal heart block  Mobitz 1 heart block  Dyspnea on exertion  Coronary artery disease with remote stenting  Obstructive sleep apnea   On  new CPAP machine which should help w nocturnal bradycardia/heart block  AliveCor recommended for the intermittent bradycardia--what caused his day time HR to be in the 40s when ZIO showed daytime averages in the 60s ? Ectopy ? Worse heart block   Discussed symptoms of progressive conduction disease, lightheadedness abrupt changes in exercise capacity assoc with changes in HR  Without symptoms of ischemia    COVID 19 screen The patient denies symptoms of COVID 19 at this time.  The importance of social distancing was discussed today.  Follow-up:  prn   Current medicines are reviewed at length with the patient today.   The patient does not have concerns regarding his medicines.  The following changes were made today:  none  Labs/ tests ordered today include:   No orders of the defined types were placed in this encounter.   Future tests ( post COVID )     Patient Risk:  after full review of this patients clinical status, I feel that they are at moderate risk at this time.  Today, I have spent 13 minutes with the patient with telehealth technology discussing the above.  Signed, Sherryl Manges, MD  04/11/2020 3:56 PM     [facility] [address] [place] [address] [phone] (office) [phone] (fax)

## 2020-04-11 NOTE — Patient Instructions (Signed)
Medication Instructions:  Your physician recommends that you continue on your current medications as directed. Please refer to the Current Medication list given to you today.  *If you need a refill on your cardiac medications before your next appointment, please call your pharmacy*   Lab Work: None ordered.  If you have labs (blood work) drawn today and your tests are completely normal, you will receive your results only by:  MyChart Message (if you have MyChart) OR  A paper copy in the mail If you have any lab test that is abnormal or we need to change your treatment, we will call you to review the results.   Testing/Procedures: None ordered.    Follow-Up: At [facility], you and your health needs are our priority.  As part of our continuing mission to provide you with exceptional heart care, we have created designated Provider Care Teams.  These Care Teams include your primary Cardiologist (physician) and Advanced Practice Providers (APPs -  Physician Assistants and Nurse Practitioners) who all work together to provide you with the care you need, when you need it.  We recommend signing up for the patient portal called "MyChart".  Sign up information is provided on this After Visit Summary.  MyChart is used to connect with patients for Virtual Visits (Telemedicine).  Patients are able to view lab/test results, encounter notes, upcoming appointments, etc.  Non-urgent messages can be sent to your provider as well.   To learn more about what you can do with MyChart, go to [email or web address].    Your next appointment:   As needed with Dr Graciela Husbands  Other Instructions Consider purchasing an AliveCor as discussed by Dr Graciela Husbands

## 2020-04-11 NOTE — Telephone Encounter (Signed)
  Patient Consent for Virtual Visit         Brandon Soto has provided verbal consent on 04/11/2020 for a virtual visit (video or telephone).   CONSENT FOR VIRTUAL VISIT FOR:  Brandon Soto  By participating in this virtual visit I agree to the following:  I hereby voluntarily request, consent and authorize [facility] and its employed or contracted physicians, physician assistants, nurse practitioners or other licensed health care professionals (the Practitioner), to provide me with telemedicine health care services (the "Services") as deemed necessary by the treating Practitioner. I acknowledge and consent to receive the Services by the Practitioner via telemedicine. I understand that the telemedicine visit will involve communicating with the Practitioner through live audiovisual communication technology and the disclosure of certain medical information by electronic transmission. I acknowledge that I have been given the opportunity to request an in-person assessment or other available alternative prior to the telemedicine visit and am voluntarily participating in the telemedicine visit.  I understand that I have the right to withhold or withdraw my consent to the use of telemedicine in the course of my care at any time, without affecting my right to future care or treatment, and that the Practitioner or I may terminate the telemedicine visit at any time. I understand that I have the right to inspect all information obtained and/or recorded in the course of the telemedicine visit and may receive copies of available information for a reasonable fee.  I understand that some of the potential risks of receiving the Services via telemedicine include:  [name] Delay or interruption in medical evaluation due to technological equipment failure or disruption; . Information transmitted may not be sufficient (e.g. poor resolution of images) to allow for appropriate medical decision making by the Practitioner;  and/or  . In rare instances, security protocols could fail, causing a breach of personal health information.  Furthermore, I acknowledge that it is my responsibility to provide information about my medical history, conditions and care that is complete and accurate to the best of my ability. I acknowledge that Practitioner's advice, recommendations, and/or decision may be based on factors not within their control, such as incomplete or inaccurate data provided by me or distortions of diagnostic images or specimens that may result from electronic transmissions. I understand that the practice of medicine is not an exact science and that Practitioner makes no warranties or guarantees regarding treatment outcomes. I acknowledge that a copy of this consent can be made available to me via my patient portal [facility] MyChart), or I can request a printed copy by calling the office of [facility].    I understand that my insurance will be billed for this visit.   I have read or had this consent read to me. . I understand the contents of this consent, which adequately explains the benefits and risks of the Services being provided via telemedicine.  . I have been provided ample opportunity to ask questions regarding this consent and the Services and have had my questions answered to my satisfaction. . I give my informed consent for the services to be provided through the use of telemedicine in my medical care

## 2020-06-04 ENCOUNTER — Encounter: Payer: Self-pay | Admitting: Cardiovascular Disease

## 2020-06-04 ENCOUNTER — Ambulatory Visit: Payer: Medicare Other | Admitting: Cardiovascular Disease

## 2020-06-04 ENCOUNTER — Other Ambulatory Visit: Payer: Self-pay

## 2020-06-04 VITALS — BP 122/62 | HR 41 | Ht 73.0 in | Wt 237.0 lb

## 2020-06-04 DIAGNOSIS — I441 Atrioventricular block, second degree: Secondary | ICD-10-CM

## 2020-06-04 DIAGNOSIS — I2510 Atherosclerotic heart disease of native coronary artery without angina pectoris: Secondary | ICD-10-CM

## 2020-06-04 DIAGNOSIS — E7800 Pure hypercholesterolemia, unspecified: Secondary | ICD-10-CM

## 2020-06-04 DIAGNOSIS — G4733 Obstructive sleep apnea (adult) (pediatric): Secondary | ICD-10-CM

## 2020-06-04 DIAGNOSIS — R001 Bradycardia, unspecified: Secondary | ICD-10-CM

## 2020-06-04 DIAGNOSIS — I10 Essential (primary) hypertension: Secondary | ICD-10-CM | POA: Diagnosis not present

## 2020-06-04 DIAGNOSIS — I480 Paroxysmal atrial fibrillation: Secondary | ICD-10-CM | POA: Diagnosis not present

## 2020-06-04 NOTE — Assessment & Plan Note (Signed)
History of second-degree AV block with long pauses in the past primarily in the a.m. hours by Zio patch monitoring.  His EKG today today does show Wenke block with ventricular spots of 41.  He is totally asymptomatic.  I did refer him to Dr. Graciela Husbands for evaluation who did not think he was a candidate for device at this time.

## 2020-06-04 NOTE — Assessment & Plan Note (Signed)
History of CAD status post LAD intervention by Dr. Jorje Guild in 2001 in the setting of myocardial infarction.  I catheterized him in 2004 revealing a patent stent with anteroapical wall motion abnormality and EF of 45 to 50%.  He had a Myoview performed October 2011 which showed apical scar without ischemia with a normal EF.  2D echo performed 10/13/2019 revealed normal LV systolic function with grade 1 diastolic dysfunction.  He is otherwise asymptomatic.

## 2020-06-04 NOTE — Assessment & Plan Note (Signed)
History of essential hypertension with blood pressure measured today 122/62.  He is on amlodipine, losartan and chlorthalidone.

## 2020-06-04 NOTE — Assessment & Plan Note (Signed)
History of hyperlipidemia on statin therapy with lipid profile performed 10/31/2019 revealing total cholesterol 150, LDL 70 and HDL of 51.

## 2020-06-04 NOTE — Assessment & Plan Note (Signed)
History of PAF found on loop recorder implantation currently on Xarelto

## 2020-06-04 NOTE — Progress Notes (Signed)
06/04/2020 Brandon Soto   Feb 21, 1946  [id number]  Primary Physician Nadene Rubins Nyoka Cowden, MD Primary Cardiologist: Runell Gess MD FACP, [place], [place], [place]  HPI:  Brandon Soto is a 74 y.o.  mildly overweight, married, Caucasian male father of 2, grandfather to 3 grandchildren who I last saw in the 12/06/2019.  His wife Brandon Soto is also a patient of mine... He has a history of CAD status post LAD intervention by Dr. Jorje Guild in 2001 in the setting of a myocardial infarction. I catheterization him in 2004 revealing a patent stent with an anteroapical wall motion abnormality and EF of 45-50%. His other problems include hypertension, hyperlipidemia, and hypothyroidism. He denies chest pain or shortness of breath. His last Myoview performed in October of 2011 showed apical scar, and echo showed a normal EF. His most recent lab work revealed a total cholesterol of 146, LDL of 72, and HDL 49.he had a left total knee replacement performed by Dr. Hadassah Pais 06/27/12 which was uncomplicated. He denies chest pain or shortness of breath. His primary care physician, Rita Ohara, follows his lipid profile closely. He had a recent stroke apparently in 2 vascular territories. Workup has been unrevealing including carotid Dopplers which were essentially normal and a transthoracic echo that did not show an embolic source. Dr. Hillis Range placed a loop recorder to rule out paroxysmal atrial fibrillation as a cause.He was originally placed on aspirin and Plavix however the loop recorder did reveal short bursts of A. fib and he was switched to Xarelto.Healso has had a sleep studyand has been placed on C Pap followed by Dr. Mayford Knife.   He has noticed some increasing dyspnea on exertion and some bradycardia.   He does have a history of thyroid disease as well. He denies chest pain. Recent 2D echo performed 10/13/2019 revealed normal LV systolic function with grade 1 diastolic dysfunction.  I performed an event  monitoring revealing episodes of complete heart block with pauses longest 7 seconds and multiple shorter pauses but still significant.  He is not on any negative chronotropic drugs.  He was seen by Dr. Graciela Husbands in for evaluation of this he did not feel that he was a candidate for device implantation.  Most of his long pauses were in the a.m. hours.  Since I saw him 6 months ago he continues to do well.  He does notice bradycardia during the daylight hours however he is asymptomatic from these.  He denies chest pain or shortness of breath.   Current Meds  Medication Sig  . acetaminophen (TYLENOL) 325 MG tablet Take 650 mg by mouth every 6 (six) hours as needed for mild pain. For pain in hands  . amLODipine (NORVASC) 10 MG tablet Take 5 mg by mouth daily.  . B Complex Vitamins (VITAMIN B COMPLEX) TABS Take 1 tablet by mouth daily.  . chlorthalidone (HYGROTON) 25 MG tablet Take 1 tablet (25 mg total) by mouth daily.  [name] esomeprazole (NEXIUM) 40 MG capsule TAKE 1 CAPSULE BY MOUTH 2  TIMES DAILY BEFORE A MEAL. (Patient taking differently: daily. )  . gabapentin (NEURONTIN) 600 MG tablet Take 600 mg by mouth 3 (three) times daily.  [name] levothyroxine (SYNTHROID, LEVOTHROID) 175 MCG tablet Take 200 mcg by mouth daily before breakfast.   . losartan (COZAAR) 100 MG tablet Take 1 tablet (100 mg total) by mouth daily.  . metFORMIN (GLUCOPHAGE-XR) 500 MG 24 hr tablet Take 1,000 mg by mouth 2 (two) times daily.  [name]  Multiple Vitamin (MULTIVITAMIN WITH MINERALS) TABS Take 1 tablet by mouth daily.  [name] omega-3 acid ethyl esters (LOVAZA) 1 G capsule Take 1 g by mouth daily. Taking the [facility] brand   . rosuvastatin (CRESTOR) 20 MG tablet TAKE 1 TABLET BY MOUTH AT  BEDTIME  . XARELTO 20 MG TABS tablet TAKE 1 TABLET BY MOUTH  DAILY WITH SUPPER     No Known Allergies  Social History   Socioeconomic History  . Marital status: Married    Spouse name: Not on file  . Number of children: 2  . Years of education: Not on file    . Highest education level: Not on file  Occupational History  . Occupation: retired  Tobacco Use  . Smoking status: Never Smoker  . Smokeless tobacco: Never Used  Vaping Use  . Vaping Use: Never used  Substance and Sexual Activity  . Alcohol use: No    Alcohol/week: 0.0 standard drinks  . Drug use: No  . Sexual activity: Yes  Other Topics Concern  . Not on file  Social History Narrative   Married and retired, 2 children   Stays busy keeping up his [place] home plus he has a home in [place] and takes care of his mother-in-law's home   No alcohol tobacco or drug use   Social Determinants of Corporate investment banker Strain:   . Difficulty of Paying Living Expenses: Not on file  Food Insecurity:   . Worried About Programme researcher, broadcasting/film/video in the Last Year: Not on file  . Ran Out of Food in the Last Year: Not on file  Transportation Needs:   . Lack of Transportation (Medical): Not on file  . Lack of Transportation (Non-Medical): Not on file  Physical Activity:   . Days of Exercise per Week: Not on file  . Minutes of Exercise per Session: Not on file  Stress:   . Feeling of Stress : Not on file  Social Connections:   . Frequency of Communication with Friends and Family: Not on file  . Frequency of Social Gatherings with Friends and Family: Not on file  . Attends Religious Services: Not on file  . Active Member of Clubs or Organizations: Not on file  . Attends Banker Meetings: Not on file  . Marital Status: Not on file  Intimate Partner Violence:   . Fear of Current or Ex-Partner: Not on file  . Emotionally Abused: Not on file  . Physically Abused: Not on file  . Sexually Abused: Not on file     Review of Systems: General: negative for chills, fever, night sweats or weight changes.  Cardiovascular: negative for chest pain, dyspnea on exertion, edema, orthopnea, palpitations, paroxysmal nocturnal dyspnea or shortness of breath Dermatological:  negative for rash Respiratory: negative for cough or wheezing Urologic: negative for hematuria Abdominal: negative for nausea, vomiting, diarrhea, bright red blood per rectum, melena, or hematemesis Neurologic: negative for visual changes, syncope, or dizziness All other systems reviewed and are otherwise negative except as noted above.    Blood pressure 122/62, pulse (!) 41, height 6\' 1"  (1.854 m), weight 237 lb (107.5 kg).  General appearance: alert and no distress Neck: no adenopathy, no carotid bruit, no JVD, supple, symmetrical, trachea midline and thyroid not enlarged, symmetric, no tenderness/mass/nodules Lungs: normal percussion bilaterally Heart: regular rate and rhythm, S1, S2 normal, no murmur, click, rub or gallop Extremities: extremities normal, atraumatic, no cyanosis or edema Pulses: 2+ and symmetric Skin: Skin  color, texture, turgor normal. No rashes or lesions Neurologic: Alert and oriented X 3, normal strength and tone. Normal symmetric reflexes. Normal coordination and gait  EKG second-degree AV block with ventricular spots of 41 and poor R wave progression with septal Q waves.  I personally reviewed this EKG.  ASSESSMENT AND PLAN:   Hypertension History of essential hypertension with blood pressure measured today 122/62.  He is on amlodipine, losartan and chlorthalidone.  Hypercholesteremia History of hyperlipidemia on statin therapy with lipid profile performed 10/31/2019 revealing total cholesterol 150, LDL 70 and HDL of 51.  Second degree AV block History of second-degree AV block with long pauses in the past primarily in the a.m. hours by Zio patch monitoring.  His EKG today today does show Wenke block with ventricular spots of 41.  He is totally asymptomatic.  I did refer him to Dr. Graciela Husbands for evaluation who did not think he was a candidate for device at this time.  OSA (obstructive sleep apnea) History of obstructive sleep apnea on CPAP  PAF (paroxysmal  atrial fibrillation) (HCC) History of PAF found on loop recorder implantation currently on Xarelto  Coronary artery disease History of CAD status post LAD intervention by Dr. Jorje Guild in 2001 in the setting of myocardial infarction.  I catheterized him in 2004 revealing a patent stent with anteroapical wall motion abnormality and EF of 45 to 50%.  He had a Myoview performed October 2011 which showed apical scar without ischemia with a normal EF.  2D echo performed 10/13/2019 revealed normal LV systolic function with grade 1 diastolic dysfunction.  He is otherwise asymptomatic.      Runell Gess MD FACP,FACC,FAHA, [facility] 06/04/2020 9:24 AM

## 2020-06-04 NOTE — Assessment & Plan Note (Signed)
History of obstructive sleep apnea on CPAP. 

## 2020-06-04 NOTE — Patient Instructions (Signed)

## 2020-09-26 ENCOUNTER — Telehealth: Payer: Self-pay | Admitting: Cardiovascular Disease

## 2020-09-26 DIAGNOSIS — R001 Bradycardia, unspecified: Secondary | ICD-10-CM

## 2020-09-26 NOTE — Telephone Encounter (Signed)
Called the patient's wife back per the dpr. She stated that she wanted her husband to get a second opinion on his low heart rate. She stated that it runs in the 40's but he is asymptomatic. The patient has seen EP in the past but she stated that she wants another opinion as well because she does not feel comfortable with the heart rate being in the 40's.

## 2020-09-26 NOTE — Telephone Encounter (Signed)
New Message:     She would like for Dr Allyson Sabal to give Onalee Hua a referral for a Second Opinion for his low heart rate please.

## 2020-09-27 NOTE — Telephone Encounter (Signed)
Left a message for the patient to call back.  Referral has been placed.

## 2020-09-27 NOTE — Telephone Encounter (Signed)
The patient has seen Dr. Johney Frame and Dr. Graciela Husbands for evaluation of this.  If he wants to see another EP can send him to Dr. Lalla Brothers for third opinion.

## 2020-10-07 ENCOUNTER — Telehealth: Payer: Self-pay | Admitting: Internal Medicine

## 2020-10-07 NOTE — Telephone Encounter (Signed)
New message     Pt would like a second opinion per Referral from Dr. Allyson Sabal.   Pt is scheduled for an appt with Dr. Lalla Brothers on 04.07.22.

## 2020-10-10 NOTE — Telephone Encounter (Signed)
noted 

## 2020-10-31 ENCOUNTER — Ambulatory Visit: Payer: Medicare Other | Admitting: Cardiology

## 2020-10-31 ENCOUNTER — Encounter: Payer: Self-pay | Admitting: Cardiology

## 2020-10-31 ENCOUNTER — Other Ambulatory Visit: Payer: Self-pay

## 2020-10-31 VITALS — BP 118/72 | HR 46 | Ht 73.0 in | Wt 239.0 lb

## 2020-10-31 DIAGNOSIS — I480 Paroxysmal atrial fibrillation: Secondary | ICD-10-CM | POA: Diagnosis not present

## 2020-10-31 DIAGNOSIS — I441 Atrioventricular block, second degree: Secondary | ICD-10-CM

## 2020-10-31 NOTE — Patient Instructions (Addendum)

## 2020-10-31 NOTE — Progress Notes (Signed)
Electrophysiology Office Note:    Date:  10/31/2020   ID:  Brandon Soto, DOB 09/09/1945, MRN [id number]  PCP:  Melida Quitter, MD  [facility] Cardiologist:  Nanetta Batty, MD  [facility] Electrophysiologist:  None   Referring MD: Runell Gess, MD   Chief Complaint: Bradycardia  History of Present Illness:    Brandon Soto is a 75 y.o. male who presents for an evaluation of bradycardia at the request of Dr. Allyson Sabal. Their medical history includes coronary artery disease post PCI to the LAD in 2001, hypertension, hyperlipidemia and hypothyroidism.  He also has a history of a stroke.  After the stroke a loop recorder was implanted by Dr. Johney Frame.  The loop recorder revealed short salvos of atrial fibrillation and thus, the patient was placed on anticoagulation.  The patient was seen for the same by Dr. Graciela Husbands in September 2021.  At that appointment, it was felt that the majority of his bradycardia was at nighttime.  The patient had just started CPAP at the time of his appointment with Dr. Graciela Husbands.  It was believed that treating the obstructive sleep apnea would help with some of the nocturnal bradycardia and pauses.  The patient last saw Dr. Allyson Sabal on June 04, 2020.  At that appointment it was noted that he was completely asymptomatic related to his heart rates.  Today the patient tells me that he is completely asymptomatic.  No syncope or presyncope.  He is active working on his land here in [place] and some property in [place].  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  He is very hesitant to pursue pacemaker implant.  Past Medical History:  Diagnosis Date  . BPH (benign prostatic hyperplasia)   . Cataract    bilateral  . Coronary artery disease    s/p LAD intervention 2001  . CVA (cerebral infarction)   . Dyslipidemia   . GERD (gastroesophageal reflux disease)   . Hypertension   . Hypothyroidism     "shrank w/radiation ?1990's" (06/27/2012)  . Left knee DJD   . Myocardial infarction (HCC)   . OSA (obstructive sleep apnea) 11/29/2014  . Osteoarthritis   . PONV (postoperative nausea and vomiting)   . Sleep apnea    cpap  . Small intestinal bacterial overgrowth 07/05/2018  . Stroke [facility])     Past Surgical History:  Procedure Laterality Date  . angioplasty N/A   . CARDIAC CATHETERIZATION  ~ 2009  . CHOLECYSTECTOMY  ~ 2006  . COLONOSCOPY  06/2014  . CORONARY ANGIOPLASTY    . CORONARY ANGIOPLASTY WITH STENT PLACEMENT  2001   "1" (06/27/2012)  . 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 LOOP RECORDER IMPLANT N/A 12/15/2013   Procedure: LOOP RECORDER IMPLANT;  Surgeon: Gardiner Rhyme, MD;  [facility] CATH LAB;  Service: Cardiovascular;  Laterality: N/A;  . MM CORP SCREENING MAMMO BIL/CAD ([facility] HX)    . PARTIAL KNEE ARTHROPLASTY  06/27/2012   Procedure: UNICOMPARTMENTAL KNEE;  Surgeon: Nilda Simmer, MD;  Location: [facility] OR;  Service: Orthopedics;  Laterality: Left;  left unicompartmental knee  . SHOULDER ARTHROSCOPY WITH ROTATOR CUFF REPAIR AND SUBACROMIAL DECOMPRESSION Left 2005   (06/27/2012)  . stent placement N/A   . TEE WITHOUT CARDIOVERSION N/A 01/03/2014   Procedure: TRANSESOPHAGEAL ECHOCARDIOGRAM (TEE);  Surgeon: Thurmon Fair, MD;  Location: [facility]  ENDOSCOPY;  Service: Cardiovascular;  Laterality: N/A;    Current Medications: Current Meds  Medication Sig  . acetaminophen (TYLENOL) 325 MG tablet Take 650 mg by mouth every 6 (six) hours as needed for mild pain. For pain in hands  . amLODipine (NORVASC) 10 MG tablet Take 5 mg by mouth daily.  . B Complex Vitamins (VITAMIN B COMPLEX) TABS Take 1 tablet by mouth daily.  . chlorthalidone (HYGROTON) 25 MG tablet Take 1 tablet (25 mg total) by mouth daily.  . Cyanocobalamin (B-12) 1000 MCG CAPS Take 1 tablet by mouth daily.  [name] esomeprazole (NEXIUM) 40 MG capsule TAKE 1 CAPSULE BY MOUTH 2   TIMES DAILY BEFORE A MEAL.  [name] gabapentin (NEURONTIN) 600 MG tablet Take 600 mg by mouth 3 (three) times daily.  [name] levothyroxine (SYNTHROID, LEVOTHROID) 175 MCG tablet Take 200 mcg by mouth daily before breakfast.   . losartan (COZAAR) 100 MG tablet Take 1 tablet (100 mg total) by mouth daily.  . metFORMIN (GLUCOPHAGE-XR) 500 MG 24 hr tablet Take 1,000 mg by mouth 2 (two) times daily.  . Multiple Vitamin (MULTIVITAMIN WITH MINERALS) TABS Take 1 tablet by mouth daily.  [name] omega-3 acid ethyl esters (LOVAZA) 1 G capsule Take 1 g by mouth daily. Taking the [facility] brand  . Omega-3 Fatty Acids ([employer] FISH OIL) 1000 MG CAPS Take 1 capsule by mouth daily.  . rosuvastatin (CRESTOR) 20 MG tablet TAKE 1 TABLET BY MOUTH AT  BEDTIME  . tamsulosin (FLOMAX) 0.4 MG CAPS capsule Take 0.4 mg by mouth daily.  Carlena Hurl 20 MG TABS tablet TAKE 1 TABLET BY MOUTH  DAILY WITH SUPPER     Allergies:   Patient has no known allergies.   Social History   Socioeconomic History  . Marital status: Married    Spouse name: Not on file  . Number of children: 2  . Years of education: Not on file  . Highest education level: Not on file  Occupational History  . Occupation: retired  Tobacco Use  . Smoking status: Never Smoker  . Smokeless tobacco: Never Used  Vaping Use  . Vaping Use: Never used  Substance and Sexual Activity  . Alcohol use: No    Alcohol/week: 0.0 standard drinks  . Drug use: No  . Sexual activity: Yes  Other Topics Concern  . Not on file  Social History Narrative   Married and retired, 2 children   Stays busy keeping up his [place] home plus he has a home in [place] and takes care of his mother-in-law's home   No alcohol tobacco or drug use   Social Determinants of Corporate investment banker Strain: Not on file  Food Insecurity: Not on file  Transportation Needs: Not on file  Physical Activity: Not on file  Stress: Not on file  Social Connections: Not on file     Family  History: The patient's family history includes Cancer - Lung in his brother; Esophageal cancer in his brother; Heart attack in his mother; Heart disease in his brother; Liver cancer in his brother.  ROS:   Please see the history of present illness.    All other systems reviewed and are negative.  EKGs/Labs/Other Studies Reviewed:    The following studies were reviewed today:  May 12th, 2021 ZIO personally reviewed 1: Sinus rhythm/sinus bradycardia/sinus tachycardia 2: Frequent PVCs with short runs of nonsustained ventricular tachycardia 3: High-grade heart block/complete heart block with pauses up to 7 seconds 4: Needs return office  visit today       EKG:  The ekg ordered today demonstrates sinus rhythm with winky block conduction.  Ventricular rate in the mid 40s.  Patient brings in a heart rate record with heart rates ranging from the low 40s to mid 60s  Recent Labs: No results found for requested labs within last 8760 hours.  Recent Lipid Panel    Component Value Date/Time   CHOL 150 10/31/2019 0941   TRIG 174 (H) 10/31/2019 0941   HDL 51 10/31/2019 0941   CHOLHDL 2.9 10/31/2019 0941   CHOLHDL 2.6 07/09/2015 0824   VLDL 20 07/09/2015 0824   LDLCALC 70 10/31/2019 0941    Physical Exam:    VS:  BP 118/72   Pulse (!) 46   Ht 6\' 1"  (1.854 m)   Wt 239 lb (108.4 kg)   SpO2 96%   BMI 31.53 kg/m     Wt Readings from Last 3 Encounters:  10/31/20 239 lb (108.4 kg)  06/04/20 237 lb (107.5 kg)  04/11/20 237 lb (107.5 kg)     GEN:  Well nourished, well developed in no acute distress HEENT: Normal NECK: No JVD; No carotid bruits LYMPHATICS: No lymphadenopathy CARDIAC: RRR, no murmurs, rubs, gallops RESPIRATORY:  Clear to auscultation without rales, wheezing or rhonchi  ABDOMEN: Soft, non-tender, non-distended MUSCULOSKELETAL:  No edema; No deformity  SKIN: Warm and dry NEUROLOGIC:  Alert and oriented x 3 PSYCHIATRIC:  Normal affect   ASSESSMENT:    1. PAF  (paroxysmal atrial fibrillation) (HCC)   2. Heart block AV complete (HCC)    PLAN:    In order of problems listed above:  1. Nocturnal Bradycardia and Paroxysmal AV Block 2. Wenckebach conduction   Monitor from 2021 shows predominantly nocturnal bradycardia and pauses. The patient has remained asymptomatic.  The patient has winky block conduction on today's EKG.  Thankfully, the patient is asymptomatic.  We spent a lot of time today discussing the pros and cons of implanting a pacemaker.  I am concerned at this point if we implant a pacemaker he would not feel any different but assume the risks of having an implanted device which are significant. We discussed the indications/symptoms that he should be on the lookout for--syncope, presyncope, decreased exercise tolerance. I gave him my card and he will reach out should he develop any symptoms. I have encouraged him to continue wearing his CPAP for this OSA.   Medication Adjustments/Labs and Tests Ordered: Current medicines are reviewed at length with the patient today.  Concerns regarding medicines are outlined above.  Orders Placed This Encounter  Procedures  . EKG 12-Lead   No orders of the defined types were placed in this encounter.    Signed, Steffanie Dunn, MD, [facility]  10/31/2020 9:26 AM    Electrophysiology [facility]

## 2020-12-04 ENCOUNTER — Other Ambulatory Visit: Payer: Self-pay | Admitting: Cardiovascular Disease

## 2020-12-04 NOTE — Telephone Encounter (Signed)
Prescription refill request for Xarelto received.  Indication:atrial fib Last office visit:4/22  [place] Weight:108.4 kg Age:75 Scr:0.9  6/21 CrCl:108.73 ml/min  Prescription refilled

## 2020-12-26 ENCOUNTER — Telehealth: Payer: Self-pay | Admitting: Cardiology

## 2020-12-26 NOTE — Telephone Encounter (Signed)
Spoke with patient about low heart rate and weakness.  Patient HR has been in the 30's, 40's and 50's.  This morning was 66 but has dropped to 46.  Blood pressure readings 130/76, 131/72, 133/76, 125/65.  Patient reports Monday heart rate was in the 30's and all he did was sit around due to weakness.  No chest pain or shortness of breath. Advised patient I would send the information to his provider and wait for advisement.

## 2020-12-26 NOTE — Telephone Encounter (Signed)
STAT if HR is under 50 or over 120 (normal HR is 60-100 beats per minute)  1) What is your heart rate? 66, 10 minutes ago 46  2) Do you have a log of your heart rate readings (document readings)? Last month or so eanging 44, 35, 40, 46 ranging  3) Do you have any other symptoms? Sluggish and does not feel like doing anything.    Patient states his HR has been very low for the last month or so. He states today it was 66, but then dropped to 46 ten minutes ago. He states Monday  Was worse and his HR went down to 35 and could not get around at all.

## 2020-12-31 ENCOUNTER — Telehealth: Payer: Self-pay

## 2020-12-31 DIAGNOSIS — I480 Paroxysmal atrial fibrillation: Secondary | ICD-10-CM

## 2020-12-31 DIAGNOSIS — R001 Bradycardia, unspecified: Secondary | ICD-10-CM

## 2020-12-31 NOTE — Telephone Encounter (Signed)
Gery Pray, RN to Me      8:29 AM I saw nothing available until August, I will send to you to see if you can squeeze him in.   December 26, 2020  Lanier Prude, MD to Gery Pray, RN . Me      8:36 PM Boneta Lucks,  Can we get Brandon Soto in to see me soon to discuss pacemaker placement?  Can likely do a virtual visit if that is easier to schedule.  We need to update his echocardiogram as well to make sure he doesn't need a CRT.  Thanks!  Brandon Soto        4:18 PM Gery Pray, RN routed this conversation to Me . Lanier Prude, MD     Gery Pray, RN     4:16 PM Note Spoke with patient about low heart rate and weakness.  Patient HR has been in the 30's, 40's and 50's.  This morning was 66 but has dropped to 46.  Blood pressure readings 130/76, 131/72, 133/76, 125/65.  Patient reports Monday heart rate was in the 30's and all he did was sit around due to weakness.  No chest pain or shortness of breath. Advised patient I would send the information to his provider and wait for advisement.     Gery Pray, RN to Lanier Prude, MD      4:09 PM Received a call from Brandon Soto and he reports heart rate readings in the 30's, 40's and 50's. In the 30's yesterday and patient felt very weak, no pain or shortness of breath. This morning HR was 66 and then dropped to 46. Patient b/p readings have been 130/76, 131/72, 133/76, 125/65. Please advise.       SZ  3:59 PM Zobro, Selena routed this conversation to [facility] Triage      Zobro, Selena   SZ  3:59 PM Note STAT if HR is under 50 or over 120 (normal HR is 60-100 beats per minute)  1. What is your heart rate? 66, 10 minutes ago 46  2. Do you have a log of your heart rate readings (document readings)? Last month or so eanging 44, 35, 40, 46 ranging  3. Do you have any other symptoms? Sluggish and does not feel like doing anything.    Patient states his HR has been very low  for the last month or so. He states today it was 66, but then dropped to 46 ten minutes ago. He states Monday  Was worse and his HR went down to 35 and could not get around at all.

## 2021-01-03 NOTE — Telephone Encounter (Signed)
Echo has been scheduled.  Message sent to schedulers to schedule follow up with Dr. Lalla Brothers.

## 2021-01-21 ENCOUNTER — Other Ambulatory Visit: Payer: Self-pay

## 2021-01-21 ENCOUNTER — Ambulatory Visit (HOSPITAL_COMMUNITY): Payer: Medicare Other | Attending: Cardiology

## 2021-01-21 DIAGNOSIS — I480 Paroxysmal atrial fibrillation: Secondary | ICD-10-CM | POA: Insufficient documentation

## 2021-01-21 DIAGNOSIS — R001 Bradycardia, unspecified: Secondary | ICD-10-CM | POA: Insufficient documentation

## 2021-01-21 LAB — ECHOCARDIOGRAM COMPLETE
Area-P 1/2: 3.68 cm2
S' Lateral: 3.4 cm

## 2021-03-04 ENCOUNTER — Telehealth: Payer: Self-pay | Admitting: Cardiology

## 2021-03-04 ENCOUNTER — Encounter: Payer: Self-pay | Admitting: Cardiology

## 2021-03-04 ENCOUNTER — Other Ambulatory Visit: Payer: Self-pay

## 2021-03-04 ENCOUNTER — Ambulatory Visit: Payer: Medicare Other | Admitting: Cardiology

## 2021-03-04 VITALS — BP 122/70 | HR 50 | Ht 73.0 in | Wt 237.2 lb

## 2021-03-04 DIAGNOSIS — I442 Atrioventricular block, complete: Secondary | ICD-10-CM | POA: Diagnosis not present

## 2021-03-04 DIAGNOSIS — I10 Essential (primary) hypertension: Secondary | ICD-10-CM

## 2021-03-04 DIAGNOSIS — I441 Atrioventricular block, second degree: Secondary | ICD-10-CM | POA: Diagnosis not present

## 2021-03-04 DIAGNOSIS — R001 Bradycardia, unspecified: Secondary | ICD-10-CM

## 2021-03-04 LAB — CBC WITH DIFFERENTIAL/PLATELET
Basophils Absolute: 0 10*3/uL (ref 0.0–0.2)
Basos: 0 %
EOS (ABSOLUTE): 0.1 10*3/uL (ref 0.0–0.4)
Eos: 1 %
Hematocrit: 46.8 % (ref 37.5–51.0)
Hemoglobin: 16.4 g/dL (ref 13.0–17.7)
Immature Grans (Abs): 0.1 10*3/uL (ref 0.0–0.1)
Immature Granulocytes: 1 %
Lymphocytes Absolute: 3 10*3/uL (ref 0.7–3.1)
Lymphs: 38 %
MCH: 33.5 pg — ABNORMAL HIGH (ref 26.6–33.0)
MCHC: 35 g/dL (ref 31.5–35.7)
MCV: 96 fL (ref 79–97)
Monocytes Absolute: 0.9 10*3/uL (ref 0.1–0.9)
Monocytes: 12 %
Neutrophils Absolute: 3.7 10*3/uL (ref 1.4–7.0)
Neutrophils: 48 %
Platelets: 175 10*3/uL (ref 150–450)
RBC: 4.9 x10E6/uL (ref 4.14–5.80)
RDW: 11.6 % (ref 11.6–15.4)
WBC: 7.7 10*3/uL (ref 3.4–10.8)

## 2021-03-04 LAB — BASIC METABOLIC PANEL
BUN/Creatinine Ratio: 13 (ref 10–24)
BUN: 13 mg/dL (ref 8–27)
CO2: 24 mmol/L (ref 20–29)
Calcium: 10 mg/dL (ref 8.6–10.2)
Chloride: 93 mmol/L — ABNORMAL LOW (ref 96–106)
Creatinine, Ser: 1 mg/dL (ref 0.76–1.27)
Glucose: 137 mg/dL — ABNORMAL HIGH (ref 65–99)
Potassium: 3.9 mmol/L (ref 3.5–5.2)
Sodium: 136 mmol/L (ref 134–144)
eGFR: 78 mL/min/{1.73_m2} (ref >59)

## 2021-03-04 NOTE — Telephone Encounter (Signed)
Returned call to Pt.  Pt rescheduled for pacemaker for March 07, 2021.  Updated instructions given.  Sent to precert.

## 2021-03-04 NOTE — Progress Notes (Signed)
Electrophysiology Office Follow up Visit Note:    Date:  03/04/2021   ID:  Brandon Soto, DOB Apr 08, 1946, MRN [id number]  PCP:  Melida Quitter, MD  [facility] Cardiologist:  Nanetta Batty, MD  [facility] Electrophysiologist:  Lanier Prude, MD    Interval History:    Brandon Soto is a 75 y.o. male who presents for a follow up visit. They were last seen in clinic 10/31/2020 for PAF and bradycardia and paroxysmal AV block. At the time of my last appointment, the patient seem to be relatively asymptomatic with his Wenckebach AV block and bradycardia so we elected to not proceed with permanent pacemaker implant.  Since that time, the patient has had more symptoms associated with lower heart rates.  He reports heart rates at home in the 30s that caused him to feel very weak.  Today he presents to discuss pacemaker implant.     Past Medical History:  Diagnosis Date   BPH (benign prostatic hyperplasia)    Cataract    bilateral   Coronary artery disease    s/p LAD intervention 2001   CVA (cerebral infarction)    Dyslipidemia    GERD (gastroesophageal reflux disease)    Hypertension    Hypothyroidism    "shrank w/radiation ?1990's" (06/27/2012)   Left knee DJD    Myocardial infarction (HCC)    OSA (obstructive sleep apnea) 11/29/2014   Osteoarthritis    PONV (postoperative nausea and vomiting)    Sleep apnea    cpap   Small intestinal bacterial overgrowth 07/05/2018   Stroke [facility])     Past Surgical History:  Procedure Laterality Date   angioplasty N/A    CARDIAC CATHETERIZATION  ~ 2009   CHOLECYSTECTOMY  ~ 2006   COLONOSCOPY  06/2014   CORONARY ANGIOPLASTY     CORONARY ANGIOPLASTY WITH STENT PLACEMENT  2001   "1" (06/27/2012)   LOOP RECORDER IMPLANT  12-15-13   MDT LinQ implanted by Dr Johney Frame for cryptogenic stroke   LOOP RECORDER IMPLANT N/A 12/15/2013   Procedure: LOOP RECORDER IMPLANT;  Surgeon: Gardiner Rhyme, MD;  Location: [facility] CATH LAB;  Service: Cardiovascular;   Laterality: N/A;   MM CORP SCREENING MAMMO BIL/CAD ([facility] HX)     PARTIAL KNEE ARTHROPLASTY  06/27/2012   Procedure: UNICOMPARTMENTAL KNEE;  Surgeon: Nilda Simmer, MD;  Location: [facility] OR;  Service: Orthopedics;  Laterality: Left;  left unicompartmental knee   SHOULDER ARTHROSCOPY WITH ROTATOR CUFF REPAIR AND SUBACROMIAL DECOMPRESSION Left 2005   (06/27/2012)   stent placement N/A    TEE WITHOUT CARDIOVERSION N/A 01/03/2014   Procedure: TRANSESOPHAGEAL ECHOCARDIOGRAM (TEE);  Surgeon: Thurmon Fair, MD;  Location: [facility] ENDOSCOPY;  Service: Cardiovascular;  Laterality: N/A;    Current Medications: Current Meds  Medication Sig   acetaminophen (TYLENOL) 500 MG tablet Take 1,000 mg by mouth every 6 (six) hours as needed for mild pain (For pain in hands).   amLODipine (NORVASC) 10 MG tablet Take 5 mg by mouth daily.   chlorthalidone (HYGROTON) 25 MG tablet Take 1 tablet (25 mg total) by mouth daily.   Cyanocobalamin (B-12) 5000 MCG CAPS Take 5,000 mcg by mouth daily.   esomeprazole (NEXIUM) 40 MG capsule TAKE 1 CAPSULE BY MOUTH 2  TIMES DAILY BEFORE A MEAL. (Patient taking differently: Take 40 mg by mouth daily at 12 noon.)   gabapentin (NEURONTIN) 600 MG tablet Take 600 mg by mouth 2 (two) times daily.   levothyroxine (SYNTHROID, LEVOTHROID) 175 MCG tablet  Take 175 mcg by mouth daily before breakfast.   losartan (COZAAR) 100 MG tablet Take 1 tablet (100 mg total) by mouth daily.   Omega-3 Fatty Acids ([employer] FISH OIL) 1000 MG CAPS Take 1,000 mg by mouth daily.   rosuvastatin (CRESTOR) 20 MG tablet TAKE 1 TABLET BY MOUTH AT  BEDTIME   tamsulosin (FLOMAX) 0.4 MG CAPS capsule Take 0.4 mg by mouth at bedtime.   XARELTO 20 MG TABS tablet TAKE 1 TABLET BY MOUTH  DAILY WITH SUPPER   [DISCONTINUED] B Complex Vitamins (VITAMIN B COMPLEX) TABS Take 1 tablet by mouth daily.   [DISCONTINUED] metFORMIN (GLUCOPHAGE-XR) 500 MG 24 hr tablet Take 1,000 mg by mouth 2 (two) times daily.   [DISCONTINUED] Multiple Vitamin  (MULTIVITAMIN WITH MINERALS) TABS Take 1 tablet by mouth daily.   [DISCONTINUED] omega-3 acid ethyl esters (LOVAZA) 1 G capsule Take 1 g by mouth daily. Taking the [facility] brand     Allergies:   Patient has no known allergies.   Social History   Socioeconomic History   Marital status: Married    Spouse name: Not on file   Number of children: 2   Years of education: Not on file   Highest education level: Not on file  Occupational History   Occupation: retired  Tobacco Use   Smoking status: Never   Smokeless tobacco: Never  Vaping Use   Vaping Use: Never used  Substance and Sexual Activity   Alcohol use: No    Alcohol/week: 0.0 standard drinks   Drug use: No   Sexual activity: Yes  Other Topics Concern   Not on file  Social History Narrative   Married and retired, 2 children   Stays busy keeping up his [place] home plus he has a home in [place] and takes care of his mother-in-law's home   No alcohol tobacco or drug use   Social Determinants of Corporate investment banker Strain: Not on file  Food Insecurity: Not on file  Transportation Needs: Not on file  Physical Activity: Not on file  Stress: Not on file  Social Connections: Not on file     Family History: The patient's family history includes Cancer - Lung in his brother; Esophageal cancer in his brother; Heart attack in his mother; Heart disease in his brother; Liver cancer in his brother.  ROS:   Please see the history of present illness.    All other systems reviewed and are negative.  EKGs/Labs/Other Studies Reviewed:    The following studies were reviewed today:   EKG:  The ekg ordered today demonstrates sinus rhythm with wenckebach AV conduction.  Recent Labs: 03/04/2021: BUN 13; Creatinine, Ser 1.00; Hemoglobin 16.4; Platelets 175; Potassium 3.9; Sodium 136  Recent Lipid Panel    Component Value Date/Time   CHOL 150 10/31/2019 0941   TRIG 174 (H) 10/31/2019 0941   HDL 51 10/31/2019 0941    CHOLHDL 2.9 10/31/2019 0941   CHOLHDL 2.6 07/09/2015 0824   VLDL 20 07/09/2015 0824   LDLCALC 70 10/31/2019 0941    Physical Exam:    VS:  BP 122/70   Pulse (!) 50   Ht 6\' 1"  (1.854 m)   Wt 237 lb 3.2 oz (107.6 kg)   SpO2 98%   BMI 31.29 kg/m     Wt Readings from Last 3 Encounters:  03/04/21 237 lb 3.2 oz (107.6 kg)  10/31/20 239 lb (108.4 kg)  06/04/20 237 lb (107.5 kg)     GEN:  Well nourished, well developed in no acute distress HEENT: Normal NECK: No JVD; No carotid bruits LYMPHATICS: No lymphadenopathy CARDIAC: Irregular rhythm, no murmurs, rubs, gallops RESPIRATORY:  Clear to auscultation without rales, wheezing or rhonchi  ABDOMEN: Soft, non-tender, non-distended MUSCULOSKELETAL:  No edema; No deformity  SKIN: Warm and dry NEUROLOGIC:  Alert and oriented x 3 PSYCHIATRIC:  Normal affect   ASSESSMENT:    1. Wenckebach second degree AV block   2. Symptomatic bradycardia   3. Primary hypertension   4. CHB (complete heart block) (HCC)    PLAN:    In order of problems listed above:    1. Symptomatic bradycardia/Heart block AV complete (HCC)/paroxysmal AV block Patient with more symptoms concerning for symptomatic bradycardia related to his AV conduction disease.  I think we should proceed with permanent pacemaker implant.  I discussed the permanent pacemaker implant procedure with the patient during today's visit at length.  We discussed the risks and recovery time and he wishes to proceed.  Risks, benefits, alternatives to PPM implantation were discussed in detail with the patient today. The patient understands that the risks include but are not limited to bleeding, infection, pneumothorax, perforation, tamponade, vascular damage, renal failure, MI, stroke, death, and lead dislodgement and wishes to proceed.  We will therefore schedule device implantation at the next available time.  He needs to hold his Xarelto for 3 days prior to the procedure.   3.  Primary hypertension Controlled.  Continue current regimen.        Total time spent with patient today 42 minutes. This includes reviewing records, evaluating the patient and coordinating care.   Medication Adjustments/Labs and Tests Ordered: Current medicines are reviewed at length with the patient today.  Concerns regarding medicines are outlined above.  Orders Placed This Encounter  Procedures   Basic Metabolic Panel (BMET)   CBC w/Diff   EKG 12-Lead    No orders of the defined types were placed in this encounter.    Signed, Steffanie Dunn, MD, [facility], [facility] 03/04/2021 6:17 PM    Electrophysiology [facility]

## 2021-03-04 NOTE — Telephone Encounter (Signed)
Patient called and wanted to know if it was possible to move his heart cath up to this Friday.

## 2021-03-04 NOTE — H&P (View-Only) (Signed)
Electrophysiology Office Follow up Visit Note:    Date:  03/04/2021   ID:  Brandon Soto, DOB Apr 08, 1946, MRN [id number]  PCP:  Melida Quitter, MD  [facility] Cardiologist:  Nanetta Batty, MD  [facility] Electrophysiologist:  Lanier Prude, MD    Interval History:    Brandon Soto is a 75 y.o. male who presents for a follow up visit. They were last seen in clinic 10/31/2020 for PAF and bradycardia and paroxysmal AV block. At the time of my last appointment, the patient seem to be relatively asymptomatic with his Wenckebach AV block and bradycardia so we elected to not proceed with permanent pacemaker implant.  Since that time, the patient has had more symptoms associated with lower heart rates.  He reports heart rates at home in the 30s that caused him to feel very weak.  Today he presents to discuss pacemaker implant.     Past Medical History:  Diagnosis Date   BPH (benign prostatic hyperplasia)    Cataract    bilateral   Coronary artery disease    s/p LAD intervention 2001   CVA (cerebral infarction)    Dyslipidemia    GERD (gastroesophageal reflux disease)    Hypertension    Hypothyroidism    "shrank w/radiation ?1990's" (06/27/2012)   Left knee DJD    Myocardial infarction (HCC)    OSA (obstructive sleep apnea) 11/29/2014   Osteoarthritis    PONV (postoperative nausea and vomiting)    Sleep apnea    cpap   Small intestinal bacterial overgrowth 07/05/2018   Stroke [facility])     Past Surgical History:  Procedure Laterality Date   angioplasty N/A    CARDIAC CATHETERIZATION  ~ 2009   CHOLECYSTECTOMY  ~ 2006   COLONOSCOPY  06/2014   CORONARY ANGIOPLASTY     CORONARY ANGIOPLASTY WITH STENT PLACEMENT  2001   "1" (06/27/2012)   LOOP RECORDER IMPLANT  12-15-13   MDT LinQ implanted by Dr Johney Frame for cryptogenic stroke   LOOP RECORDER IMPLANT N/A 12/15/2013   Procedure: LOOP RECORDER IMPLANT;  Surgeon: Gardiner Rhyme, MD;  Location: [facility] CATH LAB;  Service: Cardiovascular;   Laterality: N/A;   MM CORP SCREENING MAMMO BIL/CAD ([facility] HX)     PARTIAL KNEE ARTHROPLASTY  06/27/2012   Procedure: UNICOMPARTMENTAL KNEE;  Surgeon: Nilda Simmer, MD;  Location: [facility] OR;  Service: Orthopedics;  Laterality: Left;  left unicompartmental knee   SHOULDER ARTHROSCOPY WITH ROTATOR CUFF REPAIR AND SUBACROMIAL DECOMPRESSION Left 2005   (06/27/2012)   stent placement N/A    TEE WITHOUT CARDIOVERSION N/A 01/03/2014   Procedure: TRANSESOPHAGEAL ECHOCARDIOGRAM (TEE);  Surgeon: Thurmon Fair, MD;  Location: [facility] ENDOSCOPY;  Service: Cardiovascular;  Laterality: N/A;    Current Medications: Current Meds  Medication Sig   acetaminophen (TYLENOL) 500 MG tablet Take 1,000 mg by mouth every 6 (six) hours as needed for mild pain (For pain in hands).   amLODipine (NORVASC) 10 MG tablet Take 5 mg by mouth daily.   chlorthalidone (HYGROTON) 25 MG tablet Take 1 tablet (25 mg total) by mouth daily.   Cyanocobalamin (B-12) 5000 MCG CAPS Take 5,000 mcg by mouth daily.   esomeprazole (NEXIUM) 40 MG capsule TAKE 1 CAPSULE BY MOUTH 2  TIMES DAILY BEFORE A MEAL. (Patient taking differently: Take 40 mg by mouth daily at 12 noon.)   gabapentin (NEURONTIN) 600 MG tablet Take 600 mg by mouth 2 (two) times daily.   levothyroxine (SYNTHROID, LEVOTHROID) 175 MCG tablet  Take 175 mcg by mouth daily before breakfast.   losartan (COZAAR) 100 MG tablet Take 1 tablet (100 mg total) by mouth daily.   Omega-3 Fatty Acids ([employer] FISH OIL) 1000 MG CAPS Take 1,000 mg by mouth daily.   rosuvastatin (CRESTOR) 20 MG tablet TAKE 1 TABLET BY MOUTH AT  BEDTIME   tamsulosin (FLOMAX) 0.4 MG CAPS capsule Take 0.4 mg by mouth at bedtime.   XARELTO 20 MG TABS tablet TAKE 1 TABLET BY MOUTH  DAILY WITH SUPPER   [DISCONTINUED] B Complex Vitamins (VITAMIN B COMPLEX) TABS Take 1 tablet by mouth daily.   [DISCONTINUED] metFORMIN (GLUCOPHAGE-XR) 500 MG 24 hr tablet Take 1,000 mg by mouth 2 (two) times daily.   [DISCONTINUED] Multiple Vitamin  (MULTIVITAMIN WITH MINERALS) TABS Take 1 tablet by mouth daily.   [DISCONTINUED] omega-3 acid ethyl esters (LOVAZA) 1 G capsule Take 1 g by mouth daily. Taking the [facility] brand     Allergies:   Patient has no known allergies.   Social History   Socioeconomic History   Marital status: Married    Spouse name: Not on file   Number of children: 2   Years of education: Not on file   Highest education level: Not on file  Occupational History   Occupation: retired  Tobacco Use   Smoking status: Never   Smokeless tobacco: Never  Vaping Use   Vaping Use: Never used  Substance and Sexual Activity   Alcohol use: No    Alcohol/week: 0.0 standard drinks   Drug use: No   Sexual activity: Yes  Other Topics Concern   Not on file  Social History Narrative   Married and retired, 2 children   Stays busy keeping up his [place] home plus he has a home in [place] and takes care of his mother-in-law's home   No alcohol tobacco or drug use   Social Determinants of Corporate investment banker Strain: Not on file  Food Insecurity: Not on file  Transportation Needs: Not on file  Physical Activity: Not on file  Stress: Not on file  Social Connections: Not on file     Family History: The patient's family history includes Cancer - Lung in his brother; Esophageal cancer in his brother; Heart attack in his mother; Heart disease in his brother; Liver cancer in his brother.  ROS:   Please see the history of present illness.    All other systems reviewed and are negative.  EKGs/Labs/Other Studies Reviewed:    The following studies were reviewed today:   EKG:  The ekg ordered today demonstrates sinus rhythm with wenckebach AV conduction.  Recent Labs: 03/04/2021: BUN 13; Creatinine, Ser 1.00; Hemoglobin 16.4; Platelets 175; Potassium 3.9; Sodium 136  Recent Lipid Panel    Component Value Date/Time   CHOL 150 10/31/2019 0941   TRIG 174 (H) 10/31/2019 0941   HDL 51 10/31/2019 0941    CHOLHDL 2.9 10/31/2019 0941   CHOLHDL 2.6 07/09/2015 0824   VLDL 20 07/09/2015 0824   LDLCALC 70 10/31/2019 0941    Physical Exam:    VS:  BP 122/70   Pulse (!) 50   Ht 6\' 1"  (1.854 m)   Wt 237 lb 3.2 oz (107.6 kg)   SpO2 98%   BMI 31.29 kg/m     Wt Readings from Last 3 Encounters:  03/04/21 237 lb 3.2 oz (107.6 kg)  10/31/20 239 lb (108.4 kg)  06/04/20 237 lb (107.5 kg)     GEN:  Well nourished, well developed in no acute distress HEENT: Normal NECK: No JVD; No carotid bruits LYMPHATICS: No lymphadenopathy CARDIAC: Irregular rhythm, no murmurs, rubs, gallops RESPIRATORY:  Clear to auscultation without rales, wheezing or rhonchi  ABDOMEN: Soft, non-tender, non-distended MUSCULOSKELETAL:  No edema; No deformity  SKIN: Warm and dry NEUROLOGIC:  Alert and oriented x 3 PSYCHIATRIC:  Normal affect   ASSESSMENT:    1. Wenckebach second degree AV block   2. Symptomatic bradycardia   3. Primary hypertension   4. CHB (complete heart block) (HCC)    PLAN:    In order of problems listed above:    1. Symptomatic bradycardia/Heart block AV complete (HCC)/paroxysmal AV block Patient with more symptoms concerning for symptomatic bradycardia related to his AV conduction disease.  I think we should proceed with permanent pacemaker implant.  I discussed the permanent pacemaker implant procedure with the patient during today's visit at length.  We discussed the risks and recovery time and he wishes to proceed.  Risks, benefits, alternatives to PPM implantation were discussed in detail with the patient today. The patient understands that the risks include but are not limited to bleeding, infection, pneumothorax, perforation, tamponade, vascular damage, renal failure, MI, stroke, death, and lead dislodgement and wishes to proceed.  We will therefore schedule device implantation at the next available time.  He needs to hold his Xarelto for 3 days prior to the procedure.   3.  Primary hypertension Controlled.  Continue current regimen.        Total time spent with patient today 42 minutes. This includes reviewing records, evaluating the patient and coordinating care.   Medication Adjustments/Labs and Tests Ordered: Current medicines are reviewed at length with the patient today.  Concerns regarding medicines are outlined above.  Orders Placed This Encounter  Procedures   Basic Metabolic Panel (BMET)   CBC w/Diff   EKG 12-Lead    No orders of the defined types were placed in this encounter.    Signed, Steffanie Dunn, MD, [facility], [facility] 03/04/2021 6:17 PM    Electrophysiology [facility]

## 2021-03-04 NOTE — Patient Instructions (Addendum)
Medication Instructions:  Your physician recommends that you continue on your current medications as directed. Please refer to the Current Medication list given to you today. *If you need a refill on your cardiac medications before your next appointment, please call your pharmacy*  Lab Work: You will get lab work today:  CBC and BMP  If you have labs (blood work) drawn today and your tests are completely normal, you will receive your results only by: MyChart Message (if you have MyChart) OR A paper copy in the mail If you have any lab test that is abnormal or we need to change your treatment, we will call you to review the results.  Testing/Procedures: None ordered.  Follow-Up: At [facility], you and your health needs are our priority.  As part of our continuing mission to provide you with exceptional heart care, we have created designated Provider Care Teams.  These Care Teams include your primary Cardiologist (physician) and Advanced Practice Providers (APPs -  Physician Assistants and Nurse Practitioners) who all work together to provide you with the care you need, when you need it.  Your next appointment:    SEE INSTRUCTION LETTER  Pacemaker Implantation, Adult Pacemaker implantation is a procedure to place a pacemaker inside the chest. A pacemaker is a small computer that sends electrical signals to the heart and helps the heart beat normally. A pacemaker also stores information about heart rhythms. You may need pacemaker implantation if you have: A slow heartbeat (bradycardia). Loss of consciousness that happens repeatedly (syncope) or repeated episodes of dizziness or light-headedness because of an irregular heart rate. Shortness of breath (dyspnea) due to heart problems. The pacemaker usually attaches to your heart through a wire called a lead. One or two leads may be needed. There are different types of pacemakers: Transvenous pacemaker. This type is placed under the skin or  muscle of your upper chest area. The lead goes through a vein in the chest area to reach the inside of the heart. Epicardial pacemaker. This type is placed under the skin or muscle of your chest or abdomen. The lead goes through your chest to the outside of the heart. Tell a health care provider about: Any allergies you have. All medicines you are taking, including vitamins, herbs, eye drops, creams, and over-the-counter medicines. Any problems you or family members have had with anesthetic medicines. Any blood or bone disorders you have. Any surgeries you have had. Any medical conditions you have. Whether you are pregnant or may be pregnant. What are the risks? Generally, this is a safe procedure. However, problems may occur, including: Infection. Bleeding. Failure of the pacemaker or the lead. Collapse of a lung or bleeding into a lung. Blood clot inside a blood vessel with a lead. Damage to the heart. Infection inside the heart (endocarditis). Allergic reactions to medicines. What happens before the procedure? Staying hydrated Follow instructions from your health care provider about hydration, which may include: Up to 2 hours before the procedure - you may continue to drink clear liquids, such as water, clear fruit juice, black coffee, and plain tea.  Eating and drinking restrictions Follow instructions from your health care provider about eating and drinking, which may include: 8 hours before the procedure - stop eating heavy meals or foods, such as meat, fried foods, or fatty foods. 6 hours before the procedure - stop eating light meals or foods, such as toast or cereal. 6 hours before the procedure - stop drinking milk or drinks that contain  milk. 2 hours before the procedure - stop drinking clear liquids. Medicines Ask your health care provider about: Changing or stopping your regular medicines. This is especially important if you are taking diabetes medicines or blood  thinners. Taking medicines such as aspirin and ibuprofen. These medicines can thin your blood. Do not take these medicines unless your health care provider tells you to take them. Taking over-the-counter medicines, vitamins, herbs, and supplements. Tests You may have: A heart evaluation. This may include: An electrocardiogram (ECG). This involves placing patches on your skin to check your heart rhythm. A chest X-ray. An echocardiogram. This is a test that uses sound waves (ultrasound) to produce an image of the heart. A cardiac rhythm monitor. This is used to record your heart rhythm and any events for a longer period of time. Blood tests. Genetic testing. General instructions Do not use any products that contain nicotine or tobacco for at least 4 weeks before the procedure. These products include cigarettes, e-cigarettes, and chewing tobacco. If you need help quitting, ask your health care provider. Ask your health care provider: How your surgery site will be marked. What steps will be taken to help prevent infection. These steps may include: Removing hair at the surgery site. Washing skin with a germ-killing soap. Receiving antibiotic medicine. Plan to have someone take you home from the hospital or clinic. If you will be going home right after the procedure, plan to have someone with you for 24 hours. What happens during the procedure? An IV will be inserted into one of your veins. You will be given one or more of the following: A medicine to help you relax (sedative). A medicine to numb the area (local anesthetic). A medicine to make you fall asleep (general anesthetic). The next steps vary depending on the type of pacemaker you will be getting. If you are getting a transvenous pacemaker: An incision will be made in your upper chest. A pocket will be made for the pacemaker. It may be placed under the skin or between layers of muscle. The lead will be inserted into a blood vessel  that goes to the heart. While X-rays are taken by an imaging machine (fluoroscopy), the lead will be advanced through the vein to the inside of your heart. The other end of the lead will be tunneled under the skin and attached to the pacemaker. If you are getting an epicardial pacemaker: An incision will be made near your ribs or breastbone (sternum) for the lead. The lead will be attached to the outside of your heart. Another incision will be made in your chest or upper abdomen to create a pocket for the pacemaker. The free end of the lead will be tunneled under the skin and attached to the pacemaker. The transvenous or epicardial pacemaker will be tested. Imaging studies may be done to check the lead position. The incisions will be closed with stitches (sutures), adhesive strips, or skin glue. Bandages (dressings) will be placed over the incisions. The procedure may vary among health care providers and hospitals. What happens after the procedure? Your blood pressure, heart rate, breathing rate, and blood oxygen level will be monitored until you leave the hospital or clinic. You may be given antibiotics. You will be given pain medicine. An ECG and chest X-rays will be done. You may need to wear a continuous type of ECG (Holter monitor) to check your heart rhythm. Your health care provider will program the pacemaker. If you were given a sedative during  the procedure, it can affect you for several hours. Do not drive or operate machinery until your health care provider says that it is safe. You will be given a pacemaker identification card. This card lists the implant date, device model, and manufacturer of your pacemaker. Summary A pacemaker is a small computer that sends electrical signals to the heart and helps the heart beat normally. There are different types of pacemakers. A pacemaker may be placed under the skin or muscle of your chest or abdomen. Follow instructions from your health  care provider about eating and drinking and about taking medicines before the procedure. This information is not intended to replace advice given to you by your health care provider. Make sure you discuss any questions you have with your healthcare provider. Document Revised: 06/14/2019 Document Reviewed: 06/14/2019 Elsevier Patient Education  2022 [employer].

## 2021-03-07 ENCOUNTER — Ambulatory Visit (HOSPITAL_COMMUNITY): Payer: Medicare Other

## 2021-03-07 ENCOUNTER — Ambulatory Visit (HOSPITAL_COMMUNITY)
Admission: RE | Admit: 2021-03-07 | Discharge: 2021-03-07 | Disposition: A | Discharge status: Home / Self Care | Payer: Medicare Other | Attending: Cardiology | Admitting: Cardiology

## 2021-03-07 ENCOUNTER — Other Ambulatory Visit: Payer: Self-pay

## 2021-03-07 ENCOUNTER — Encounter (HOSPITAL_COMMUNITY)
Admission: RE | Disposition: A | Discharge status: Home / Self Care | Payer: Self-pay | Source: Home / Self Care | Attending: Cardiology

## 2021-03-07 DIAGNOSIS — Z79899 Other long term (current) drug therapy: Secondary | ICD-10-CM | POA: Insufficient documentation

## 2021-03-07 DIAGNOSIS — R001 Bradycardia, unspecified: Secondary | ICD-10-CM | POA: Diagnosis not present

## 2021-03-07 DIAGNOSIS — Z79890 Hormone replacement therapy (postmenopausal): Secondary | ICD-10-CM | POA: Diagnosis not present

## 2021-03-07 DIAGNOSIS — I10 Essential (primary) hypertension: Secondary | ICD-10-CM | POA: Insufficient documentation

## 2021-03-07 DIAGNOSIS — Z7901 Long term (current) use of anticoagulants: Secondary | ICD-10-CM | POA: Insufficient documentation

## 2021-03-07 DIAGNOSIS — I442 Atrioventricular block, complete: Secondary | ICD-10-CM

## 2021-03-07 DIAGNOSIS — Z950 Presence of cardiac pacemaker: Secondary | ICD-10-CM

## 2021-03-07 HISTORY — PX: LOOP RECORDER REMOVAL: EP1215

## 2021-03-07 HISTORY — PX: PACEMAKER IMPLANT: EP1218

## 2021-03-07 SURGERY — PACEMAKER IMPLANT

## 2021-03-07 MED ORDER — SODIUM CHLORIDE 0.9 % IV SOLN: INTRAVENOUS | Status: DC

## 2021-03-07 MED ORDER — ACETAMINOPHEN 325 MG PO TABS
325.0000 mg | ORAL_TABLET | ORAL | Status: DC | PRN
  Filled 2021-03-07: qty 2

## 2021-03-07 MED ORDER — MIDAZOLAM HCL 5 MG/5ML IJ SOLN
INTRAMUSCULAR | Status: DC | PRN
  Administered 2021-03-07 (×2): 1 mg via INTRAVENOUS

## 2021-03-07 MED ORDER — SODIUM CHLORIDE 0.9 % IV SOLN
INTRAVENOUS | Status: AC
  Filled 2021-03-07: qty 2

## 2021-03-07 MED ORDER — CEFAZOLIN SODIUM-DEXTROSE 2-4 GM/100ML-% IV SOLN
2.0000 g | INTRAVENOUS | Status: AC
  Administered 2021-03-07: 2 g via INTRAVENOUS

## 2021-03-07 MED ORDER — CHLORHEXIDINE GLUCONATE 4 % EX LIQD: 4.0000 "application " | Freq: Once | CUTANEOUS | Status: DC

## 2021-03-07 MED ORDER — FENTANYL CITRATE (PF) 100 MCG/2ML IJ SOLN
INTRAMUSCULAR | Status: DC | PRN
  Administered 2021-03-07 (×2): 25 ug via INTRAVENOUS

## 2021-03-07 MED ORDER — POVIDONE-IODINE 10 % EX SWAB
2.0000 "application " | Freq: Once | CUTANEOUS | Status: AC
  Administered 2021-03-07: 2 via TOPICAL

## 2021-03-07 MED ORDER — MIDAZOLAM HCL 5 MG/5ML IJ SOLN
INTRAMUSCULAR | Status: AC
  Filled 2021-03-07: qty 5

## 2021-03-07 MED ORDER — LIDOCAINE HCL 1 % IJ SOLN
INTRAMUSCULAR | Status: AC
  Filled 2021-03-07: qty 60

## 2021-03-07 MED ORDER — SODIUM CHLORIDE 0.9 % IV SOLN
80.0000 mg | INTRAVENOUS | Status: AC
  Administered 2021-03-07: 80 mg

## 2021-03-07 MED ORDER — FENTANYL CITRATE (PF) 100 MCG/2ML IJ SOLN
INTRAMUSCULAR | Status: AC
  Filled 2021-03-07: qty 2

## 2021-03-07 MED ORDER — CEFAZOLIN SODIUM-DEXTROSE 2-4 GM/100ML-% IV SOLN
INTRAVENOUS | Status: AC
  Filled 2021-03-07: qty 100

## 2021-03-07 MED ORDER — HEPARIN (PORCINE) IN NACL 1000-0.9 UT/500ML-% IV SOLN
INTRAVENOUS | Status: DC | PRN
  Administered 2021-03-07: 500 mL

## 2021-03-07 MED ORDER — LIDOCAINE HCL (PF) 1 % IJ SOLN
INTRAMUSCULAR | Status: DC | PRN
  Administered 2021-03-07: 60 mL
  Administered 2021-03-07: 30 mL

## 2021-03-07 MED ORDER — ONDANSETRON HCL 4 MG/2ML IJ SOLN: 4.0000 mg | Freq: Four times a day (QID) | INTRAMUSCULAR | Status: DC | PRN

## 2021-03-07 MED ORDER — HEPARIN (PORCINE) IN NACL 1000-0.9 UT/500ML-% IV SOLN
INTRAVENOUS | Status: AC
  Filled 2021-03-07: qty 500

## 2021-03-07 MED ORDER — LIDOCAINE HCL 1 % IJ SOLN
INTRAMUSCULAR | Status: AC
  Filled 2021-03-07: qty 20

## 2021-03-07 SURGICAL SUPPLY — 12 items
CABLE SURGICAL S-101-97-12 (CABLE) ×2 IMPLANT
CATH RIGHTSITE C315HIS02 (CATHETERS) ×2 IMPLANT
IPG PACE AZUR XT DR MRI W1DR01 (Pacemaker) ×1 IMPLANT
LEAD CAPSURE NOVUS 5076-52CM (Lead) ×2 IMPLANT
LEAD SELECT SECURE 3830 383069 (Lead) ×1 IMPLANT
PACE AZURE XT DR MRI W1DR01 (Pacemaker) ×2 IMPLANT
PAD PRO RADIOLUCENT 2001M-C (PAD) ×2 IMPLANT
SELECT SECURE 3830 383069 (Lead) ×2 IMPLANT
SHEATH 7FR PRELUDE SNAP 13 (SHEATH) ×4 IMPLANT
SLITTER 6232ADJ (MISCELLANEOUS) ×2 IMPLANT
TRAY PACEMAKER INSERTION (PACKS) ×2 IMPLANT
WIRE HI TORQ VERSACORE-J 145CM (WIRE) ×2 IMPLANT

## 2021-03-07 NOTE — Discharge Instructions (Addendum)
LOOP monitor removal site care instructions Leave steri-strips (little pieces of tape) on until seen in the office for wound check appointment. Call the office [phone]) for redness, drainage, swelling, or fever. Shower/bathing instructions as below       Supplemental Discharge Instructions for  Pacemaker/Defibrillator Patients  Tomorrow, 03/08/21, send in a device transmission  Activity No heavy lifting or vigorous activity with your left/right arm for 6 to 8 weeks.  Do not raise your left/right arm above your head for one week.  Gradually raise your affected arm as drawn below.             03/12/21                     03/13/21                   03/14/21                  03/15/21 __  NO DRIVING for  1 week   ; you may begin driving on  9/51/88 .  WOUND CARE Keep the wound area clean and dry.  Do not get this area wet , no showers for one week; you may shower on 03/15/21   . Tomorrow, 03/08/21, remove the arm sling Tomorrow, 03/08/21 remove the LARGE outer plastic bandage.  Underneath the plastic bandage there are steri strips (paper tapes), DO NOT remove these. The tape/steri-strips on your wound will fall off; do not pull them off.  No bandage is needed on the site.  DO  NOT apply any creams, oils, or ointments to the wound area. If you notice any drainage or discharge from the wound, any swelling or bruising at the site, or you develop a fever > 101? F after you are discharged home, call the office at once.  Special Instructions You are still able to use cellular telephones; use the ear opposite the side where you have your pacemaker/defibrillator.  Avoid carrying your cellular phone near your device. When traveling through airports, show security personnel your identification card to avoid being screened in the metal detectors.  Ask the security personnel to use the hand wand. Avoid arc welding equipment, MRI testing (magnetic resonance imaging), TENS units (transcutaneous nerve  stimulators).  Call the office for questions about other devices. Avoid electrical appliances that are in poor condition or are not properly grounded. Microwave ovens are safe to be near or to operate.

## 2021-03-07 NOTE — Interval H&P Note (Signed)
History and Physical Interval Note:  03/07/2021 10:55 AM  Brandon Soto  has presented today for surgery, with the diagnosis of bradicardia.  The various methods of treatment have been discussed with the patient and family. After consideration of risks, benefits and other options for treatment, the patient has consented to  Procedure(s): PACEMAKER IMPLANT (N/A) as a surgical intervention.  The patient's history has been reviewed, patient examined, no change in status, stable for surgery.  I have reviewed the patient's chart and labs.  Questions were answered to the patient's satisfaction.     Flavio Lindroth T Akia Montalban

## 2021-03-10 ENCOUNTER — Encounter (HOSPITAL_COMMUNITY): Payer: Self-pay | Admitting: Cardiology

## 2021-04-03 ENCOUNTER — Telehealth: Payer: Self-pay

## 2021-04-03 ENCOUNTER — Other Ambulatory Visit: Payer: Self-pay | Admitting: Orthopedic Surgery

## 2021-04-03 ENCOUNTER — Other Ambulatory Visit (HOSPITAL_COMMUNITY): Payer: Self-pay | Admitting: Orthopedic Surgery

## 2021-04-03 ENCOUNTER — Telehealth: Payer: Self-pay | Admitting: Physician Assistant

## 2021-04-03 DIAGNOSIS — M545 Low back pain, unspecified: Secondary | ICD-10-CM

## 2021-04-03 NOTE — Telephone Encounter (Signed)
Brandon Conger Pa have questions about MRI for the patient. I let them speak with Leigh, rn.

## 2021-04-09 ENCOUNTER — Other Ambulatory Visit: Payer: Self-pay

## 2021-04-09 ENCOUNTER — Ambulatory Visit (INDEPENDENT_AMBULATORY_CARE_PROVIDER_SITE_OTHER): Payer: Medicare Other

## 2021-04-09 DIAGNOSIS — I442 Atrioventricular block, complete: Secondary | ICD-10-CM

## 2021-04-09 NOTE — Patient Instructions (Addendum)
   After Your Pacemaker   Monitor your pacemaker site for redness, swelling, and drainage. Call the device clinic at [phone] if you experience these symptoms or fever/chills.  Your incision was closed with Steri-strips or staples:  You may shower 7 days after your procedure and wash your incision with soap and water. Avoid lotions, ointments, or perfumes over your incision until it is well-healed.  You may use a hot tub or a pool after your wound check appointment if the incision is completely closed.  Do not lift, push or pull greater than 10 pounds with the affected arm until 6 weeks after your procedure. There are no other restrictions in arm movement after your wound check appointment. Your lifting restrictions are over 04/14/21. You may return to all other activities today including mowing, house work that does not including lifting greater than 10 lbs, gardening, driving, etc.   You may drive, unless driving has been restricted by your healthcare providers.  Your Pacemaker is MRI compatible.  Remote monitoring is used to monitor your pacemaker from home. This monitoring is scheduled every 91 days by our office. It allows [place] to keep an eye on the functioning of your device to ensure it is working properly. You will routinely see your Electrophysiologist annually (more often if necessary).

## 2021-04-10 LAB — CUP PACEART INCLINIC DEVICE CHECK
Battery Remaining Longevity: 149 mo
Battery Voltage: 3.2 V
Brady Statistic AP VP Percent: 2.77 %
Brady Statistic AP VS Percent: 0.01 %
Brady Statistic AS VP Percent: 96.83 %
Brady Statistic AS VS Percent: 0.4 %
Brady Statistic RA Percent Paced: 2.89 %
Brady Statistic RV Percent Paced: 99.6 %
Date Time Interrogation Session: 20220914113300
Implantable Lead Implant Date: 20220812
Implantable Lead Implant Date: 20220812
Implantable Lead Location: 753859
Implantable Lead Location: 753860
Implantable Lead Model: 3830
Implantable Lead Model: 5076
Implantable Pulse Generator Implant Date: 20220812
Lead Channel Impedance Value: 342 Ohm
Lead Channel Impedance Value: 380 Ohm
Lead Channel Impedance Value: 475 Ohm
Lead Channel Impedance Value: 665 Ohm
Lead Channel Pacing Threshold Amplitude: 0.75 V
Lead Channel Pacing Threshold Amplitude: 1 V
Lead Channel Pacing Threshold Pulse Width: 0.4 ms
Lead Channel Pacing Threshold Pulse Width: 0.4 ms
Lead Channel Sensing Intrinsic Amplitude: 1.25 mV
Lead Channel Sensing Intrinsic Amplitude: 12.125 mV
Lead Channel Setting Pacing Amplitude: 3.5 V
Lead Channel Setting Pacing Amplitude: 3.5 V
Lead Channel Setting Pacing Pulse Width: 0.4 ms
Lead Channel Setting Sensing Sensitivity: 1.2 mV

## 2021-04-10 NOTE — Progress Notes (Signed)
Wound check appointment s/p ppm placement 03/07/21. Steri-strips removed by patient prior to wound check appointment. Wound without redness or edema. Incision edges approximated, wound well healed. Normal device function. Thresholds, sensing, and impedances consistent with implant measurements. Device programmed at 3.5V/auto capture programmed on for extra safety margin until 3 month visit. Histogram distribution appropriate for patient and level of activity. No mode switches or high ventricular rates noted. Patient educated about wound care, arm mobility, lifting restrictions. Patient enrolled in remote monitoring with next transmission scheduled 06/10/21. 91 day ROV post implant with Dr. Lalla Brothers 07/03/21.

## 2021-04-11 NOTE — Telephone Encounter (Signed)
Notified MRI with recommendations. Verbalized understanding

## 2021-04-30 ENCOUNTER — Other Ambulatory Visit: Payer: Self-pay | Admitting: Cardiovascular Disease

## 2021-04-30 NOTE — Telephone Encounter (Signed)
Prescription refill request for Xarelto received.  Indication:atrial fib Last office visit:8/22 Weight:107.5 kg Age:75 Scr:1.0 CrCl:95.56  ml/min  Prescription refilled

## 2021-05-08 ENCOUNTER — Other Ambulatory Visit: Payer: Self-pay

## 2021-05-08 ENCOUNTER — Ambulatory Visit (HOSPITAL_COMMUNITY)
Admission: RE | Admit: 2021-05-08 | Discharge: 2021-05-08 | Disposition: A | Discharge status: Home / Self Care | Payer: Medicare Other | Source: Ambulatory Visit | Attending: Orthopedic Surgery | Admitting: Orthopedic Surgery

## 2021-05-08 DIAGNOSIS — M545 Low back pain, unspecified: Secondary | ICD-10-CM | POA: Insufficient documentation

## 2021-05-08 NOTE — Progress Notes (Signed)
Per order, Changed device settings for MRI to  DOO at 85 bpm  Will program device back to pre-MRI settings after completion of exam, and send transmission 

## 2021-05-14 ENCOUNTER — Telehealth: Payer: Self-pay | Admitting: Cardiovascular Disease

## 2021-05-14 NOTE — Telephone Encounter (Signed)
   [facility] Pre-operative Risk Assessment    Patient Name: Brandon Soto  DOB: 1945-12-21 MRN: [id number]  HEARTCARE STAFF:  - IMPORTANT!!!!!! Under Visit Info/Reason for Call, type in Other and utilize the format Clearance MM/DD/YY or Clearance TBD. Do not use dashes or single digits. - Please review there is not already an duplicate clearance open for this procedure. - If request is for dental extraction, please clarify the # of teeth to be extracted. - If the patient is currently at the dentist's office, call Pre-Op Callback Staff (MA/nurse) to input urgent request.  - If the patient is not currently in the dentist office, please route to the Pre-Op pool.  Request for surgical clearance:  What type of surgery is being performed? L5-S1 transforamenal epidural injection   When is this surgery scheduled? TBD pending clearance   What type of clearance is required (medical clearance vs. Pharmacy clearance to hold med vs. Both)? Pharmacy  Are there any medications that need to be held prior to surgery and how long? Plavix 5 days prior   Practice name and name of physician performing surgery? Dr. Doreatha Martin, [place]  What is the office phone number? [phone] ask for Jessica    7.   What is the office fax number? [phone]  8.   Anesthesia type (None, local, MAC, general) ? Lidocaine numbing, local numbing    Johnna Acosta 05/14/2021, 11:29 AM  _________________________________________________________________   (provider comments below)

## 2021-05-14 NOTE — Telephone Encounter (Signed)
   Primary Cardiologist: Nanetta Batty, MD  Chart reviewed as part of pre-operative protocol coverage. Given past medical history and time since last visit, based on ACC/AHA guidelines, Brandon Soto would be at acceptable risk for the planned procedure without further cardiovascular testing.   His Plavix may be held for 5 days prior to his procedure.  Please resume as soon as hemostasis is achieved.  I will route this recommendation to the requesting party via Epic fax function and remove from pre-op pool.  Please call with questions.  Thomasene Ripple. Damien Cisar NP-C    05/14/2021, 3:33 PM [facility] [address] Office [phone] Fax [phone]

## 2021-05-14 NOTE — Telephone Encounter (Signed)
[name] 75 year old male is requesting preoperative cardiac evaluation for L5-S1 epidural spinal injection.  He was last seen by Dr. Lalla Brothers on 03/07/2021.  During that time he was presenting for PPM.  He was discharged the same day at 77.  His PMH includes PAF, bradycardia, paroxysmal AV block, BPH, coronary artery disease status post LAD PCI 2001, CVA, dyslipidemia, GERD, hypertension, hypothyroidism, and OSA on CPAP.  May his Plavix be held prior to his injection?  Thank you for your help.  Please direct your response to CV DIV preop pool.  Thomasene Ripple. Karen Kinnard NP-C    05/14/2021, 11:51 AM [facility] [address] Office [phone] Fax [phone]

## 2021-06-10 ENCOUNTER — Ambulatory Visit (INDEPENDENT_AMBULATORY_CARE_PROVIDER_SITE_OTHER): Payer: Medicare Other

## 2021-06-10 DIAGNOSIS — I441 Atrioventricular block, second degree: Secondary | ICD-10-CM

## 2021-06-12 LAB — CUP PACEART REMOTE DEVICE CHECK
Battery Remaining Longevity: 151 mo
Battery Voltage: 3.18 V
Brady Statistic AP VP Percent: 3.72 %
Brady Statistic AP VS Percent: 0.01 %
Brady Statistic AS VP Percent: 95.44 %
Brady Statistic AS VS Percent: 0.84 %
Brady Statistic RA Percent Paced: 4.04 %
Brady Statistic RV Percent Paced: 99.15 %
Date Time Interrogation Session: 20221116191125
Implantable Lead Implant Date: 20220812
Implantable Lead Implant Date: 20220812
Implantable Lead Location: 753859
Implantable Lead Location: 753860
Implantable Lead Model: 3830
Implantable Lead Model: 5076
Implantable Pulse Generator Implant Date: 20220812
Lead Channel Impedance Value: 342 Ohm
Lead Channel Impedance Value: 361 Ohm
Lead Channel Impedance Value: 456 Ohm
Lead Channel Impedance Value: 684 Ohm
Lead Channel Pacing Threshold Amplitude: 1 V
Lead Channel Pacing Threshold Amplitude: 1 V
Lead Channel Pacing Threshold Pulse Width: 0.4 ms
Lead Channel Pacing Threshold Pulse Width: 0.4 ms
Lead Channel Sensing Intrinsic Amplitude: 12.375 mV
Lead Channel Sensing Intrinsic Amplitude: 12.375 mV
Lead Channel Sensing Intrinsic Amplitude: 2.375 mV
Lead Channel Sensing Intrinsic Amplitude: 2.375 mV
Lead Channel Setting Pacing Amplitude: 2 V
Lead Channel Setting Pacing Amplitude: 2.25 V
Lead Channel Setting Pacing Pulse Width: 0.4 ms
Lead Channel Setting Sensing Sensitivity: 1.2 mV

## 2021-06-18 NOTE — Progress Notes (Signed)
Remote pacemaker transmission.   

## 2021-07-03 ENCOUNTER — Other Ambulatory Visit: Payer: Self-pay

## 2021-07-03 ENCOUNTER — Encounter: Payer: Self-pay | Admitting: Cardiology

## 2021-07-03 ENCOUNTER — Ambulatory Visit: Payer: Medicare Other | Admitting: Cardiology

## 2021-07-03 VITALS — BP 124/64 | HR 91 | Ht 73.0 in | Wt 235.0 lb

## 2021-07-03 DIAGNOSIS — I441 Atrioventricular block, second degree: Secondary | ICD-10-CM

## 2021-07-03 DIAGNOSIS — I10 Essential (primary) hypertension: Secondary | ICD-10-CM

## 2021-07-03 DIAGNOSIS — I442 Atrioventricular block, complete: Secondary | ICD-10-CM | POA: Diagnosis not present

## 2021-07-03 DIAGNOSIS — Z950 Presence of cardiac pacemaker: Secondary | ICD-10-CM | POA: Diagnosis not present

## 2021-07-03 LAB — CUP PACEART INCLINIC DEVICE CHECK
Battery Remaining Longevity: 159 mo
Battery Voltage: 3.17 V
Brady Statistic AP VP Percent: 3.6 %
Brady Statistic AP VS Percent: 0 %
Brady Statistic AS VP Percent: 95.6 %
Brady Statistic AS VS Percent: 0.79 %
Brady Statistic RA Percent Paced: 3.89 %
Brady Statistic RV Percent Paced: 99.21 %
Date Time Interrogation Session: 20221208141623
Eval Rhythm: 2:1 {titer}
Implantable Lead Implant Date: 20220812
Implantable Lead Implant Date: 20220812
Implantable Lead Location: 753859
Implantable Lead Location: 753860
Implantable Lead Model: 3830
Implantable Lead Model: 5076
Implantable Pulse Generator Implant Date: 20220812
Lead Channel Impedance Value: 342 Ohm
Lead Channel Impedance Value: 380 Ohm
Lead Channel Impedance Value: 456 Ohm
Lead Channel Impedance Value: 722 Ohm
Lead Channel Pacing Threshold Amplitude: 1 V
Lead Channel Pacing Threshold Amplitude: 1 V
Lead Channel Pacing Threshold Pulse Width: 0.4 ms
Lead Channel Pacing Threshold Pulse Width: 0.4 ms
Lead Channel Sensing Intrinsic Amplitude: 1.875 mV
Lead Channel Sensing Intrinsic Amplitude: 14 mV
Lead Channel Sensing Intrinsic Amplitude: 14.625 mV
Lead Channel Sensing Intrinsic Amplitude: 2.375 mV
Lead Channel Setting Pacing Amplitude: 2 V
Lead Channel Setting Pacing Amplitude: 2 V
Lead Channel Setting Pacing Pulse Width: 0.4 ms
Lead Channel Setting Sensing Sensitivity: 1.2 mV

## 2021-07-03 NOTE — Patient Instructions (Addendum)
Medication Instructions:  Your physician recommends that you continue on your current medications as directed. Please refer to the Current Medication list given to you today. *If you need a refill on your cardiac medications before your next appointment, please call your pharmacy*  Lab Work: None. If you have labs (blood work) drawn today and your tests are completely normal, you will receive your results only by: MyChart Message (if you have MyChart) OR A paper copy in the mail If you have any lab test that is abnormal or we need to change your treatment, we will call you to review the results.  Testing/Procedures: None.  Follow-Up: At [facility], you and your health needs are our priority.  As part of our continuing mission to provide you with exceptional heart care, we have created designated Provider Care Teams.  These Care Teams include your primary Cardiologist (physician) and Advanced Practice Providers (APPs -  Physician Assistants and Nurse Practitioners) who all work together to provide you with the care you need, when you need it.  Your physician wants you to follow-up in: 9 months with   one of the following Advanced Practice Providers on your designated Care Team:    Francis Dowse, PA-C Casimiro Needle "Mardelle Matte" [place], [place]   You will receive a reminder letter in the mail two months in advance. If you don't receive a letter, please call our office to schedule the follow-up appointment.  Remote monitoring is used to monitor your Pacemaker from home. This monitoring reduces the number of office visits required to check your device to one time per year. It allows [place] to keep an eye on the functioning of your device to ensure it is working properly. You are scheduled for a device check from home on 09/09/21. You may send your transmission at any time that day. If you have a wireless device, the transmission will be sent automatically. After your physician reviews your transmission, you will  receive a postcard with your next transmission date.  We recommend signing up for the patient portal called "MyChart".  Sign up information is provided on this After Visit Summary.  MyChart is used to connect with patients for Virtual Visits (Telemedicine).  Patients are able to view lab/test results, encounter notes, upcoming appointments, etc.  Non-urgent messages can be sent to your provider as well.   To learn more about what you can do with MyChart, go to [email or web address].    Any Other Special Instructions Will Be Listed Below (If Applicable).

## 2021-07-03 NOTE — Progress Notes (Signed)
Electrophysiology Office Follow up Visit Note:    Date:  07/03/2021   ID:  Brandon Soto, DOB 1945-09-29, MRN [id number]  PCP:  Melida Quitter, MD  [facility] Cardiologist:  Nanetta Batty, MD  [facility] Electrophysiologist:  Lanier Prude, MD    Interval History:    Brandon Soto is a 75 y.o. male who presents for a follow up visit.  He underwent a pacemaker implant on March 07, 2021 with loop recorder removal.  Remote interrogations after implant have shown stable device function. He has done well since implant.  Lead parameters have been stable.  He is nearly 100% ventricular paced.      Past Medical History:  Diagnosis Date   BPH (benign prostatic hyperplasia)    Cataract    bilateral   Coronary artery disease    s/p LAD intervention 2001   CVA (cerebral infarction)    Dyslipidemia    GERD (gastroesophageal reflux disease)    Hypertension    Hypothyroidism    "shrank w/radiation ?1990's" (06/27/2012)   Left knee DJD    Myocardial infarction (HCC)    OSA (obstructive sleep apnea) 11/29/2014   Osteoarthritis    PONV (postoperative nausea and vomiting)    Sleep apnea    cpap   Small intestinal bacterial overgrowth 07/05/2018   Stroke [facility])     Past Surgical History:  Procedure Laterality Date   angioplasty N/A    CARDIAC CATHETERIZATION  ~ 2009   CHOLECYSTECTOMY  ~ 2006   COLONOSCOPY  06/2014   CORONARY ANGIOPLASTY     CORONARY ANGIOPLASTY WITH STENT PLACEMENT  2001   "1" (06/27/2012)   LOOP RECORDER IMPLANT  12-15-13   MDT LinQ implanted by Dr Johney Frame for cryptogenic stroke   LOOP RECORDER IMPLANT N/A 12/15/2013   Procedure: LOOP RECORDER IMPLANT;  Surgeon: Gardiner Rhyme, MD;  Location: [facility] CATH LAB;  Service: Cardiovascular;  Laterality: N/A;   LOOP RECORDER REMOVAL N/A 03/07/2021   Procedure: LOOP RECORDER REMOVAL;  Surgeon: Lanier Prude, MD;  Location: [facility] INVASIVE CV LAB;  Service: Cardiovascular;  Laterality: N/A;   MM CORP SCREENING MAMMO  BIL/CAD ([facility] HX)     PACEMAKER IMPLANT N/A 03/07/2021   Procedure: PACEMAKER IMPLANT;  Surgeon: Lanier Prude, MD;  Location: [facility] INVASIVE CV LAB;  Service: Cardiovascular;  Laterality: N/A;   PARTIAL KNEE ARTHROPLASTY  06/27/2012   Procedure: UNICOMPARTMENTAL KNEE;  Surgeon: Nilda Simmer, MD;  Location: [facility] OR;  Service: Orthopedics;  Laterality: Left;  left unicompartmental knee   SHOULDER ARTHROSCOPY WITH ROTATOR CUFF REPAIR AND SUBACROMIAL DECOMPRESSION Left 2005   (06/27/2012)   stent placement N/A    TEE WITHOUT CARDIOVERSION N/A 01/03/2014   Procedure: TRANSESOPHAGEAL ECHOCARDIOGRAM (TEE);  Surgeon: Thurmon Fair, MD;  Location: [facility] ENDOSCOPY;  Service: Cardiovascular;  Laterality: N/A;    Current Medications: Current Meds  Medication Sig   acetaminophen (TYLENOL) 500 MG tablet Take 1,000 mg by mouth every 6 (six) hours as needed for mild pain (For pain in hands).   amLODipine (NORVASC) 10 MG tablet Take 5 mg by mouth daily.   Brimonidine Tartrate (LUMIFY) 0.025 % SOLN Place 1 drop into both eyes 3 (three) times a week.   chlorthalidone (HYGROTON) 25 MG tablet Take 1 tablet (25 mg total) by mouth daily.   Cyanocobalamin (B-12) 5000 MCG CAPS Take 5,000 mcg by mouth daily.   esomeprazole (NEXIUM) 40 MG capsule TAKE 1 CAPSULE BY MOUTH 2  TIMES DAILY BEFORE A  MEAL. (Patient taking differently: Take 40 mg by mouth daily at 12 noon.)   gabapentin (NEURONTIN) 600 MG tablet Take 600 mg by mouth 2 (two) times daily.   JARDIANCE 10 MG TABS tablet Take 10 mg by mouth daily.   levothyroxine (SYNTHROID, LEVOTHROID) 175 MCG tablet Take 175 mcg by mouth daily before breakfast.   losartan (COZAAR) 100 MG tablet Take 1 tablet (100 mg total) by mouth daily.   Omega-3 Fatty Acids (CVS FISH OIL) 1000 MG CAPS Take 1,000 mg by mouth daily.   rosuvastatin (CRESTOR) 20 MG tablet TAKE 1 TABLET BY MOUTH AT  BEDTIME   tamsulosin (FLOMAX) 0.4 MG CAPS capsule Take 0.4 mg by mouth at bedtime.   XARELTO 20 MG  TABS tablet TAKE 1 TABLET BY MOUTH  DAILY WITH SUPPER     Allergies:   Patient has no known allergies.   Social History   Socioeconomic History   Marital status: Married    Spouse name: Not on file   Number of children: 2   Years of education: Not on file   Highest education level: Not on file  Occupational History   Occupation: retired  Tobacco Use   Smoking status: Never   Smokeless tobacco: Never  Vaping Use   Vaping Use: Never used  Substance and Sexual Activity   Alcohol use: No    Alcohol/week: 0.0 standard drinks   Drug use: No   Sexual activity: Yes  Other Topics Concern   Not on file  Social History Narrative   Married and retired, 2 children   Stays busy keeping up his [place] home plus he has a home in [place] and takes care of his mother-in-law's home   No alcohol tobacco or drug use   Social Determinants of Radio broadcast assistant Strain: Not on file  Food Insecurity: Not on file  Transportation Needs: Not on file  Physical Activity: Not on file  Stress: Not on file  Social Connections: Not on file     Family History: The patient's family history includes Cancer - Lung in his brother; Esophageal cancer in his brother; Heart attack in his mother; Heart disease in his brother; Liver cancer in his brother.  ROS:   Please see the history of present illness.    All other systems reviewed and are negative.  EKGs/Labs/Other Studies Reviewed:    The following studies were reviewed today:  July 03, 2021 in clinic device interrogation Lead parameter stable Ventricular pacing greater than 99% Underlying rhythm 2-1 heart block  EKG:  The ekg ordered today demonstrates a sensed, V pacing.  QRS duration about 140 ms.  PVC.  Recent Labs: 03/04/2021: BUN 13; Creatinine, Ser 1.00; Hemoglobin 16.4; Platelets 175; Potassium 3.9; Sodium 136  Recent Lipid Panel    Component Value Date/Time   CHOL 150 10/31/2019 0941   TRIG 174 (H)  10/31/2019 0941   HDL 51 10/31/2019 0941   CHOLHDL 2.9 10/31/2019 0941   CHOLHDL 2.6 07/09/2015 0824   VLDL 20 07/09/2015 0824   LDLCALC 70 10/31/2019 0941    Physical Exam:    VS:  BP 124/64   Pulse 91   Ht 6\' 1"  (1.854 m)   Wt 235 lb (106.6 kg)   SpO2 97%   BMI 31.00 kg/m     Wt Readings from Last 3 Encounters:  07/03/21 235 lb (106.6 kg)  03/07/21 237 lb (107.5 kg)  03/04/21 237 lb 3.2 oz (107.6 kg)  GEN:  Well nourished, well developed in no acute distress HEENT: Normal NECK: No JVD; No carotid bruits LYMPHATICS: No lymphadenopathy CARDIAC: RRR, no murmurs, rubs, gallops.  Pacemaker pocket well-healed. RESPIRATORY:  Clear to auscultation without rales, wheezing or rhonchi  ABDOMEN: Soft, non-tender, non-distended MUSCULOSKELETAL:  No edema; No deformity  SKIN: Warm and dry NEUROLOGIC:  Alert and oriented x 3 PSYCHIATRIC:  Normal affect        ASSESSMENT:    1. Second degree AV block   2. Heart block AV complete ([place])   3. Pacemaker   4. Primary hypertension    PLAN:    In order of problems listed above:   #Second-degree AV block post permanent pacemaker Doing well after pacemaker implant.  Pacemaker incision is healed well.  Device interrogation shows stable lead parameters. Continue remote monitoring  #Hypertension Controlled Continue current medical regimen  Follow-up 9 months w APP.    Medication Adjustments/Labs and Tests Ordered: Current medicines are reviewed at length with the patient today.  Concerns regarding medicines are outlined above.  No orders of the defined types were placed in this encounter.  No orders of the defined types were placed in this encounter.    Signed, Lars Mage, MD, [facility], [facility] 07/03/2021 2:13 PM    Electrophysiology [facility]

## 2021-08-06 ENCOUNTER — Other Ambulatory Visit: Payer: Self-pay

## 2021-08-06 ENCOUNTER — Encounter: Payer: Self-pay | Admitting: Cardiovascular Disease

## 2021-08-06 ENCOUNTER — Ambulatory Visit: Payer: Medicare Other | Admitting: Cardiovascular Disease

## 2021-08-06 DIAGNOSIS — I10 Essential (primary) hypertension: Secondary | ICD-10-CM

## 2021-08-06 DIAGNOSIS — I441 Atrioventricular block, second degree: Secondary | ICD-10-CM

## 2021-08-06 DIAGNOSIS — G4733 Obstructive sleep apnea (adult) (pediatric): Secondary | ICD-10-CM | POA: Diagnosis not present

## 2021-08-06 DIAGNOSIS — I2510 Atherosclerotic heart disease of native coronary artery without angina pectoris: Secondary | ICD-10-CM

## 2021-08-06 DIAGNOSIS — E7800 Pure hypercholesterolemia, unspecified: Secondary | ICD-10-CM

## 2021-08-06 DIAGNOSIS — I480 Paroxysmal atrial fibrillation: Secondary | ICD-10-CM

## 2021-08-06 DIAGNOSIS — I442 Atrioventricular block, complete: Secondary | ICD-10-CM

## 2021-08-06 NOTE — Assessment & Plan Note (Signed)
History of PAF noted on loop recorder on Xarelto oral anticoagulation.

## 2021-08-06 NOTE — Assessment & Plan Note (Signed)
Symptomatic second-degree AV block status post permanent pacemaker implantation by Dr. Lalla Brothers 03/07/2021 with removal of loop recorder.  Since implantation he feels clinically improved.

## 2021-08-06 NOTE — Assessment & Plan Note (Signed)
History of CAD status post LAD intervention by Dr. Jorje Guild in 2001 in the setting of STEMI.  I catheterized him in 2004 revealing a patent stent with anteroapical wall motion abnormality and EF of 45 to 50%.  Myoview performed October 2011 showed apical scar without ischemia.  Echo performed 10/13/2019 showed normal LV function with grade 1 diastolic dysfunction.  He remains asymptomatic.

## 2021-08-06 NOTE — Progress Notes (Signed)
08/06/2021 Brandon Soto   1946-06-19  [id number]  Primary Physician Jacalyn Lefevre Jesse Sans, MD Primary Cardiologist: Lorretta Harp MD FACP, [place], [place], [place]  HPI:  Brandon Soto is a 76 y.o.   mildly overweight, married, Caucasian male father of 2, grandfather to 3 grandchildren who I last saw in the 06/04/2020.  His wife Ivin Booty is also a patient of mine... He has a history of CAD status post LAD intervention by Dr. Chalmers Cater in 2001 in the setting of a myocardial infarction. I catheterization him in 2004 revealing a patent stent with an anteroapical wall motion abnormality and EF of 45-50%. His other problems include hypertension, hyperlipidemia, and hypothyroidism. He denies chest pain or shortness of breath. His last Myoview performed in October of 2011 showed apical scar, and echo showed a normal EF. His most recent lab work revealed a total cholesterol of 146, LDL of 72, and HDL 49.he had a left total knee replacement performed by Dr. Moshe Salisbury 06/27/12 which was uncomplicated. He denies chest pain or shortness of breath. His primary care physician, [name], follows his lipid profile closely. He had a recent stroke apparently in 2 vascular territories. Workup has been unrevealing including carotid Dopplers which were essentially normal and a transthoracic echo that did not show an embolic source. Dr. Thompson Grayer placed a loop recorder to rule out paroxysmal atrial fibrillation as a cause. He was originally placed on aspirin and Plavix however the loop recorder did reveal short bursts of A. fib and he was switched to Xarelto. He also has had a sleep study and has been placed on C Pap followed by Dr. Radford Pax.    He has noticed some increasing dyspnea on exertion and some bradycardia.    He does have a history of thyroid disease as well.  He denies chest pain.  Recent 2D echo performed 10/13/2019 revealed normal LV systolic function with grade 1 diastolic dysfunction.   I performed an event  monitoring revealing episodes of complete heart block with pauses longest 7 seconds and multiple shorter pauses but still significant.  He is not on any negative chronotropic drugs.  He was seen by Dr. Caryl Comes in for evaluation of this he did not feel that he was a candidate for device implantation.  Most of his long pauses were in the a.m. hours.   Since I saw him a year ago he did ultimately have a permanent transvenous pacemaker placed by Dr. Quentin Ore 03/07/2021 with removal of his loop recorder.  He feels clinically improved since that time.  He denies chest pain or shortness of breath.   Current Meds  Medication Sig   acetaminophen (TYLENOL) 500 MG tablet Take 1,000 mg by mouth every 6 (six) hours as needed for mild pain (For pain in hands).   amLODipine (NORVASC) 10 MG tablet Take 5 mg by mouth daily.   chlorthalidone (HYGROTON) 25 MG tablet Take 1 tablet (25 mg total) by mouth daily.   Cyanocobalamin (B-12) 5000 MCG CAPS Take 5,000 mcg by mouth daily.   esomeprazole (NEXIUM) 40 MG capsule TAKE 1 CAPSULE BY MOUTH 2  TIMES DAILY BEFORE A MEAL. (Patient taking differently: Take 40 mg by mouth daily at 12 noon.)   gabapentin (NEURONTIN) 600 MG tablet Take 600 mg by mouth 2 (two) times daily.   JARDIANCE 10 MG TABS tablet Take 10 mg by mouth daily.   levothyroxine (SYNTHROID, LEVOTHROID) 175 MCG tablet Take 175 mcg by mouth daily before breakfast.  losartan (COZAAR) 100 MG tablet Take 1 tablet (100 mg total) by mouth daily.   Omega-3 Fatty Acids (CVS FISH OIL) 1000 MG CAPS Take 1,000 mg by mouth daily.   rosuvastatin (CRESTOR) 20 MG tablet TAKE 1 TABLET BY MOUTH AT  BEDTIME   tamsulosin (FLOMAX) 0.4 MG CAPS capsule Take 0.4 mg by mouth at bedtime.   XARELTO 20 MG TABS tablet TAKE 1 TABLET BY MOUTH  DAILY WITH SUPPER     No Known Allergies  Social History   Socioeconomic History   Marital status: Married    Spouse name: Not on file   Number of children: 2   Years of education: Not on file    Highest education level: Not on file  Occupational History   Occupation: retired  Tobacco Use   Smoking status: Never   Smokeless tobacco: Never  Vaping Use   Vaping Use: Never used  Substance and Sexual Activity   Alcohol use: No    Alcohol/week: 0.0 standard drinks   Drug use: No   Sexual activity: Yes  Other Topics Concern   Not on file  Social History Narrative   Married and retired, 2 children   Stays busy keeping up his [place] home plus he has a home in [place] and takes care of his mother-in-law's home   No alcohol tobacco or drug use   Social Determinants of Radio broadcast assistant Strain: Not on file  Food Insecurity: Not on file  Transportation Needs: Not on file  Physical Activity: Not on file  Stress: Not on file  Social Connections: Not on file  Intimate Partner Violence: Not on file     Review of Systems: General: negative for chills, fever, night sweats or weight changes.  Cardiovascular: negative for chest pain, dyspnea on exertion, edema, orthopnea, palpitations, paroxysmal nocturnal dyspnea or shortness of breath Dermatological: negative for rash Respiratory: negative for cough or wheezing Urologic: negative for hematuria Abdominal: negative for nausea, vomiting, diarrhea, bright red blood per rectum, melena, or hematemesis Neurologic: negative for visual changes, syncope, or dizziness All other systems reviewed and are otherwise negative except as noted above.    Blood pressure 116/70, pulse 65, resp. rate 20, height 6\' 1"  (1.854 m), weight 238 lb 9.6 oz (108.2 kg), SpO2 96 %.  General appearance: alert and no distress Neck: no adenopathy, no carotid bruit, no JVD, supple, symmetrical, trachea midline, and thyroid not enlarged, symmetric, no tenderness/mass/nodules Lungs: clear to auscultation bilaterally Heart: regular rate and rhythm, S1, S2 normal, no murmur, click, rub or gallop Extremities: extremities normal, atraumatic,  no cyanosis or edema Pulses: 2+ and symmetric Skin: Skin color, texture, turgor normal. No rashes or lesions Neurologic: Grossly normal  EKG atrially sensed, ventricularly paced rhythm at 65.  I personally reviewed this EKG.  ASSESSMENT AND PLAN:   Hypertension History of essential hypertension her blood pressure measured today at 116/70.  He is on amlodipine, chlorthalidone and losartan.  Hypercholesteremia History of hyperlipidemia on statin therapy with lipid profile performed 04/22/2021 revealing a total cholesterol 148, LDL 71 and HDL 43.  Second degree AV block Symptomatic second-degree AV block status post permanent pacemaker implantation by Dr. Quentin Ore 03/07/2021 with removal of loop recorder.  Since implantation he feels clinically improved.  OSA (obstructive sleep apnea) History of obstructive sleep apnea on CPAP.  PAF (paroxysmal atrial fibrillation) (HCC) History of PAF noted on loop recorder on Xarelto oral anticoagulation.  Coronary artery disease History of CAD status post LAD intervention  by Dr. Chalmers Cater in 2001 in the setting of STEMI.  I catheterized him in 2004 revealing a patent stent with anteroapical wall motion abnormality and EF of 45 to 50%.  Myoview performed October 2011 showed apical scar without ischemia.  Echo performed 10/13/2019 showed normal LV function with grade 1 diastolic dysfunction.  He remains asymptomatic.  Heart block AV complete (HCC) Symptomatic complete AV block status post permanent pacemaker implantation by Dr. Quentin Ore 03/07/2021 with removal of loop recorder.  Since implantation he feels clinically improved     Lorretta Harp MD [facility], [facility] 08/06/2021 2:28 PM

## 2021-08-06 NOTE — Assessment & Plan Note (Signed)
History of hyperlipidemia on statin therapy with lipid profile performed 04/22/2021 revealing a total cholesterol 148, LDL 71 and HDL 43.

## 2021-08-06 NOTE — Assessment & Plan Note (Signed)
History of obstructive sleep apnea on CPAP. 

## 2021-08-06 NOTE — Assessment & Plan Note (Signed)
Symptomatic complete AV block status post permanent pacemaker implantation by Dr. Lalla Brothers 03/07/2021 with removal of loop recorder.  Since implantation he feels clinically improved

## 2021-08-06 NOTE — Assessment & Plan Note (Signed)
History of essential hypertension her blood pressure measured today at 116/70.  He is on amlodipine, chlorthalidone and losartan.

## 2021-08-06 NOTE — Patient Instructions (Signed)

## 2021-09-09 ENCOUNTER — Ambulatory Visit (INDEPENDENT_AMBULATORY_CARE_PROVIDER_SITE_OTHER): Payer: Medicare Other

## 2021-09-09 DIAGNOSIS — I441 Atrioventricular block, second degree: Secondary | ICD-10-CM | POA: Diagnosis not present

## 2021-09-10 LAB — CUP PACEART REMOTE DEVICE CHECK
Battery Remaining Longevity: 148 mo
Battery Voltage: 3.14 V
Brady Statistic AP VP Percent: 1.09 %
Brady Statistic AP VS Percent: 0 %
Brady Statistic AS VP Percent: 97.9 %
Brady Statistic AS VS Percent: 1.01 %
Brady Statistic RA Percent Paced: 1.37 %
Brady Statistic RV Percent Paced: 98.99 %
Date Time Interrogation Session: 20230214225729
Implantable Lead Implant Date: 20220812
Implantable Lead Implant Date: 20220812
Implantable Lead Location: 753859
Implantable Lead Location: 753860
Implantable Lead Model: 3830
Implantable Lead Model: 5076
Implantable Pulse Generator Implant Date: 20220812
Lead Channel Impedance Value: 342 Ohm
Lead Channel Impedance Value: 361 Ohm
Lead Channel Impedance Value: 551 Ohm
Lead Channel Impedance Value: 684 Ohm
Lead Channel Pacing Threshold Amplitude: 1.125 V
Lead Channel Pacing Threshold Amplitude: 1.375 V
Lead Channel Pacing Threshold Pulse Width: 0.4 ms
Lead Channel Pacing Threshold Pulse Width: 0.4 ms
Lead Channel Sensing Intrinsic Amplitude: 13 mV
Lead Channel Sensing Intrinsic Amplitude: 13 mV
Lead Channel Sensing Intrinsic Amplitude: 2.75 mV
Lead Channel Sensing Intrinsic Amplitude: 2.75 mV
Lead Channel Setting Pacing Amplitude: 2.25 V
Lead Channel Setting Pacing Amplitude: 2.75 V
Lead Channel Setting Pacing Pulse Width: 0.4 ms
Lead Channel Setting Sensing Sensitivity: 1.2 mV

## 2021-09-15 NOTE — Progress Notes (Signed)
Remote pacemaker transmission.   

## 2021-10-07 ENCOUNTER — Telehealth: Payer: Self-pay

## 2021-10-07 NOTE — Telephone Encounter (Signed)
? ?  Pre-operative Risk Assessment  ?  ?Patient Name: Brandon Soto  ?DOB: 01-06-1946 ?MRN: [id number]  ? ?  ? ?Request for Surgical Clearance   ? ?Procedure:   LEFT L4-5 MINIMALLY INVASIVE MICRODISCECTOMY ? ?Date of Surgery:  Clearance 10/23/21                              ?   ?Surgeon:  DR Emelda Brothers ?Surgeon's Group or Practice Name:  [facility] ?Phone number:  [phone] ?Fax number:  [phone] ?  ?Type of Clearance Requested:   ?- Medical  ?- Pharmacy:  Hold Clopidogrel (Plavix)   ?  ?Type of Anesthesia:  Spinal ?  ?Additional requests/questions:   ? ? ? ?

## 2021-10-07 NOTE — Telephone Encounter (Signed)
Patient with diagnosis of afib on Xarelto for anticoagulation.   ? ?Procedure: LEFT L4-5 MINIMALLY INVASIVE MICRODISCECTOMY ?Date of procedure: 10/23/21 ? ?CHA2DS2-VASc Score = 6  ?This indicates a 9.7% annual risk of stroke. ?The patient's score is based upon: ?CHF History: 0 ?HTN History: 1 ?Diabetes History: 0 ?Stroke History: 2 ?Vascular Disease History: 1 ?Age Score: 2 ?Gender Score: 0 ? ?CrCl 3mL/min using adjusted body weight due to obesity ?Platelet count 175K ? ?Typically require 3 day DOAC hold prior to spinal procedures. In setting of pt's prior stroke (afib detected on loop recorder after and pt subsequently started on anticoag), will forward to MD to confirm 3 day Xarelto hold is acceptable. ?

## 2021-10-07 NOTE — Telephone Encounter (Signed)
Ok to hold Xarelto 3 days before procedure per Dr Gwenlyn Found. Pt should resume as soon as safely possible after given elevated CV risk.  ?

## 2021-10-07 NOTE — Telephone Encounter (Signed)
Clinical pharmacist to review Xarelto 

## 2021-10-08 NOTE — Telephone Encounter (Signed)
? ?  Name: Brandon Soto  ?DOB: [birth year]  ?MRN: [id number]  ? ?Primary Cardiologist: Nanetta Batty, MD ? ?Chart reviewed as part of pre-operative protocol coverage. Patient was contacted 10/08/2021 in reference to pre-operative risk assessment for pending surgery as outlined below.  Brandon Soto was last seen on 08/06/2021 by Dr. Nanetta Batty.  Since that day, Brandon Soto has done well without exertional chest pain or worsening dyspnea. ? ?Therefore, based on ACC/AHA guidelines, the patient would be at acceptable risk for the planned procedure without further cardiovascular testing.  ? ?Patient may hold the Xarelto for 3 days prior to the procedure and restart as soon as possible afterward at the surgeon's discretion. ? ?The patient was advised that if he develops new symptoms prior to surgery to contact our office to arrange for a follow-up visit, and he verbalized understanding. ? ?I will route this recommendation to the requesting party via Epic fax function and remove from pre-op pool. Please call with questions. ? ?Azalee Course, [place] ?10/08/2021, 10:30 AM ? ?

## 2021-10-10 ENCOUNTER — Ambulatory Visit: Payer: Medicare Other | Admitting: Cardiovascular Disease

## 2021-12-09 ENCOUNTER — Ambulatory Visit (INDEPENDENT_AMBULATORY_CARE_PROVIDER_SITE_OTHER): Payer: Medicare Other

## 2021-12-09 DIAGNOSIS — I441 Atrioventricular block, second degree: Secondary | ICD-10-CM

## 2021-12-11 ENCOUNTER — Telehealth: Payer: Self-pay

## 2021-12-11 NOTE — Telephone Encounter (Signed)
   Pre-operative Risk Assessment    Patient Name: Brandon Soto  DOB: 02-Sep-1945 MRN: [id number]      Request for Surgical Clearance    Procedure:   Left L5-S1 TF ESI Lumbar injection  Date of Surgery:  Clearance TBD                                 Surgeon:  Dr. Aviva Signs Surgeon's Group or Practice Name:  [facility]  Phone number:  [phone] Fax number:  [phone]   Type of Clearance Requested:   - Pharmacy:  Hold Clopidogrel (Plavix) 5 days    Type of Anesthesia:  Not Indicated   Additional requests/questions:    Deforest Hoyles   12/11/2021, 11:02 AM

## 2021-12-12 ENCOUNTER — Telehealth: Payer: Self-pay | Admitting: Cardiology

## 2021-12-12 LAB — CUP PACEART REMOTE DEVICE CHECK
Battery Remaining Longevity: 139 mo
Battery Voltage: 3.08 V
Brady Statistic AP VP Percent: 1.45 %
Brady Statistic AP VS Percent: 0 %
Brady Statistic AS VP Percent: 97.67 %
Brady Statistic AS VS Percent: 0.88 %
Brady Statistic RA Percent Paced: 1.6 %
Brady Statistic RV Percent Paced: 99.12 %
Date Time Interrogation Session: 20230517103628
Implantable Lead Implant Date: 20220812
Implantable Lead Implant Date: 20220812
Implantable Lead Location: 753859
Implantable Lead Location: 753860
Implantable Lead Model: 3830
Implantable Lead Model: 5076
Implantable Pulse Generator Implant Date: 20220812
Lead Channel Impedance Value: 323 Ohm
Lead Channel Impedance Value: 361 Ohm
Lead Channel Impedance Value: 570 Ohm
Lead Channel Impedance Value: 627 Ohm
Lead Channel Pacing Threshold Amplitude: 1.25 V
Lead Channel Pacing Threshold Amplitude: 1.5 V
Lead Channel Pacing Threshold Pulse Width: 0.4 ms
Lead Channel Pacing Threshold Pulse Width: 0.4 ms
Lead Channel Sensing Intrinsic Amplitude: 1.875 mV
Lead Channel Sensing Intrinsic Amplitude: 1.875 mV
Lead Channel Sensing Intrinsic Amplitude: 11.75 mV
Lead Channel Sensing Intrinsic Amplitude: 11.75 mV
Lead Channel Setting Pacing Amplitude: 2.5 V
Lead Channel Setting Pacing Amplitude: 3.25 V
Lead Channel Setting Pacing Pulse Width: 0.4 ms
Lead Channel Setting Sensing Sensitivity: 1.2 mV

## 2021-12-12 NOTE — Telephone Encounter (Signed)
Called patient and updated that Dr. Lalla Brothers has not reviewed the report as of yet but when he does if he has recommendations for changes, we will reach out to him.  Advised patient that it was a fast heart beat in the top part of his heart with a short duration.  Patient voiced concern that he was going out of town tomorrow and not sure if he should go. Reassured patient to go on his trip, we will call if Dr. Lalla Brothers would like to make any changes.  Patient was appreciative of call and voiced understanding.

## 2021-12-12 NOTE — Telephone Encounter (Signed)
    Name: Brandon Soto  DOB: 1945-11-20  MRN: [id number]  Primary Cardiologist: Nanetta Batty, MD   Preoperative team, please contact this patient and set up a phone call appointment for further preoperative risk assessment. Please obtain consent and complete medication review. Thank you for your help.  I confirm that guidance regarding antiplatelet and oral anticoagulation therapy has been completed and, if necessary, noted below. It appears the clearances we keep getting have Plavix rather than Xarelto listed. Will plan for a virtual visit so this can be formally discussed to ensure pt is not taking both.      Laurann [place], PA-C 12/12/2021, 1:59 PM [facility] [address] [place], [place] [address]

## 2021-12-12 NOTE — Telephone Encounter (Signed)
Patient with diagnosis of afib on Xarelto for anticoagulation.    Procedure: Left L5-S1 TF ESI Lumbar injection Date of procedure: TBD  CHA2DS2-VASc Score = 6  This indicates a 9.7% annual risk of stroke. The patient's score is based upon: CHF History: 0 HTN History: 1 Diabetes History: 0 Stroke History: 2 Vascular Disease History: 1 Age Score: 2 Gender Score: 0   CrCl 25mL/min using adjusted body weight due to obesity Platelet count 175K  Per office protocol, patient can hold Xarelto for 3 days prior to procedure. Just cleared for a different spinal procedure 2 months ago by Dr Gwenlyn Found. He should resume as soon as safely possible after given prior stroke.

## 2021-12-12 NOTE — Telephone Encounter (Signed)
   1. Has your device fired?   2. Is you device beeping?   3. Are you experiencing draining or swelling at device site?   4. Are you calling to see if we received your device transmission?   5. Have you passed out?   Pt called, a transmission sent on 05/16 and they saw a reading on his monitor that there was a 38 seconds of atrial arrhythmia he wanted to know if this is something he needs to be concern of. He denied any symptoms. He ask if he can get ca call back today    Please route to Device Clinic Pool

## 2021-12-12 NOTE — Telephone Encounter (Signed)
    Name: LC OBLANDER  DOB: 21-Aug-1945  MRN: [id number]  Primary Cardiologist: Nanetta Batty, MD  Will route to pharm for Xarelto hold, suspect 3 days for LESI (see 3/14 for microdiscectomy clearance).  It appears the clearances we keep getting have Plavix rather than Xarelto listed. Will plan for a virtual visit request after pharm reply so this can be formally discussed to ensure pt is not taking both.    Laurann [place], PA-C 12/12/2021, 9:58 AM [facility] [address] [place], [place] [address]

## 2021-12-16 NOTE — Telephone Encounter (Signed)
1st attemp to reach pt to schedule tele preop visit for clearance. Lvm

## 2021-12-17 NOTE — Telephone Encounter (Signed)
Left message for the pt to call for a tele pre op appt.

## 2021-12-24 NOTE — Telephone Encounter (Signed)
I just s/w the pt and he said he is not having a spine injection. Pt tells me that he has had his spine injections and even had back surgery, so a bit of confusion as to why our office was sent a clearance request from Dr. Lucie Leather office. I assured the pt that I will be sure to update all parties involved that there is no need for a clearance request as he is not having any procedure.

## 2021-12-25 NOTE — Progress Notes (Signed)
Remote pacemaker transmission.   

## 2022-02-15 IMAGING — CR DG CHEST 2V
2 series · 2 of 2 positions shown · non-contrast
Comparison: 06/20/2012

CLINICAL DATA: Postop pacemaker

EXAM:
CHEST - 2 VIEW

[w chest pa]
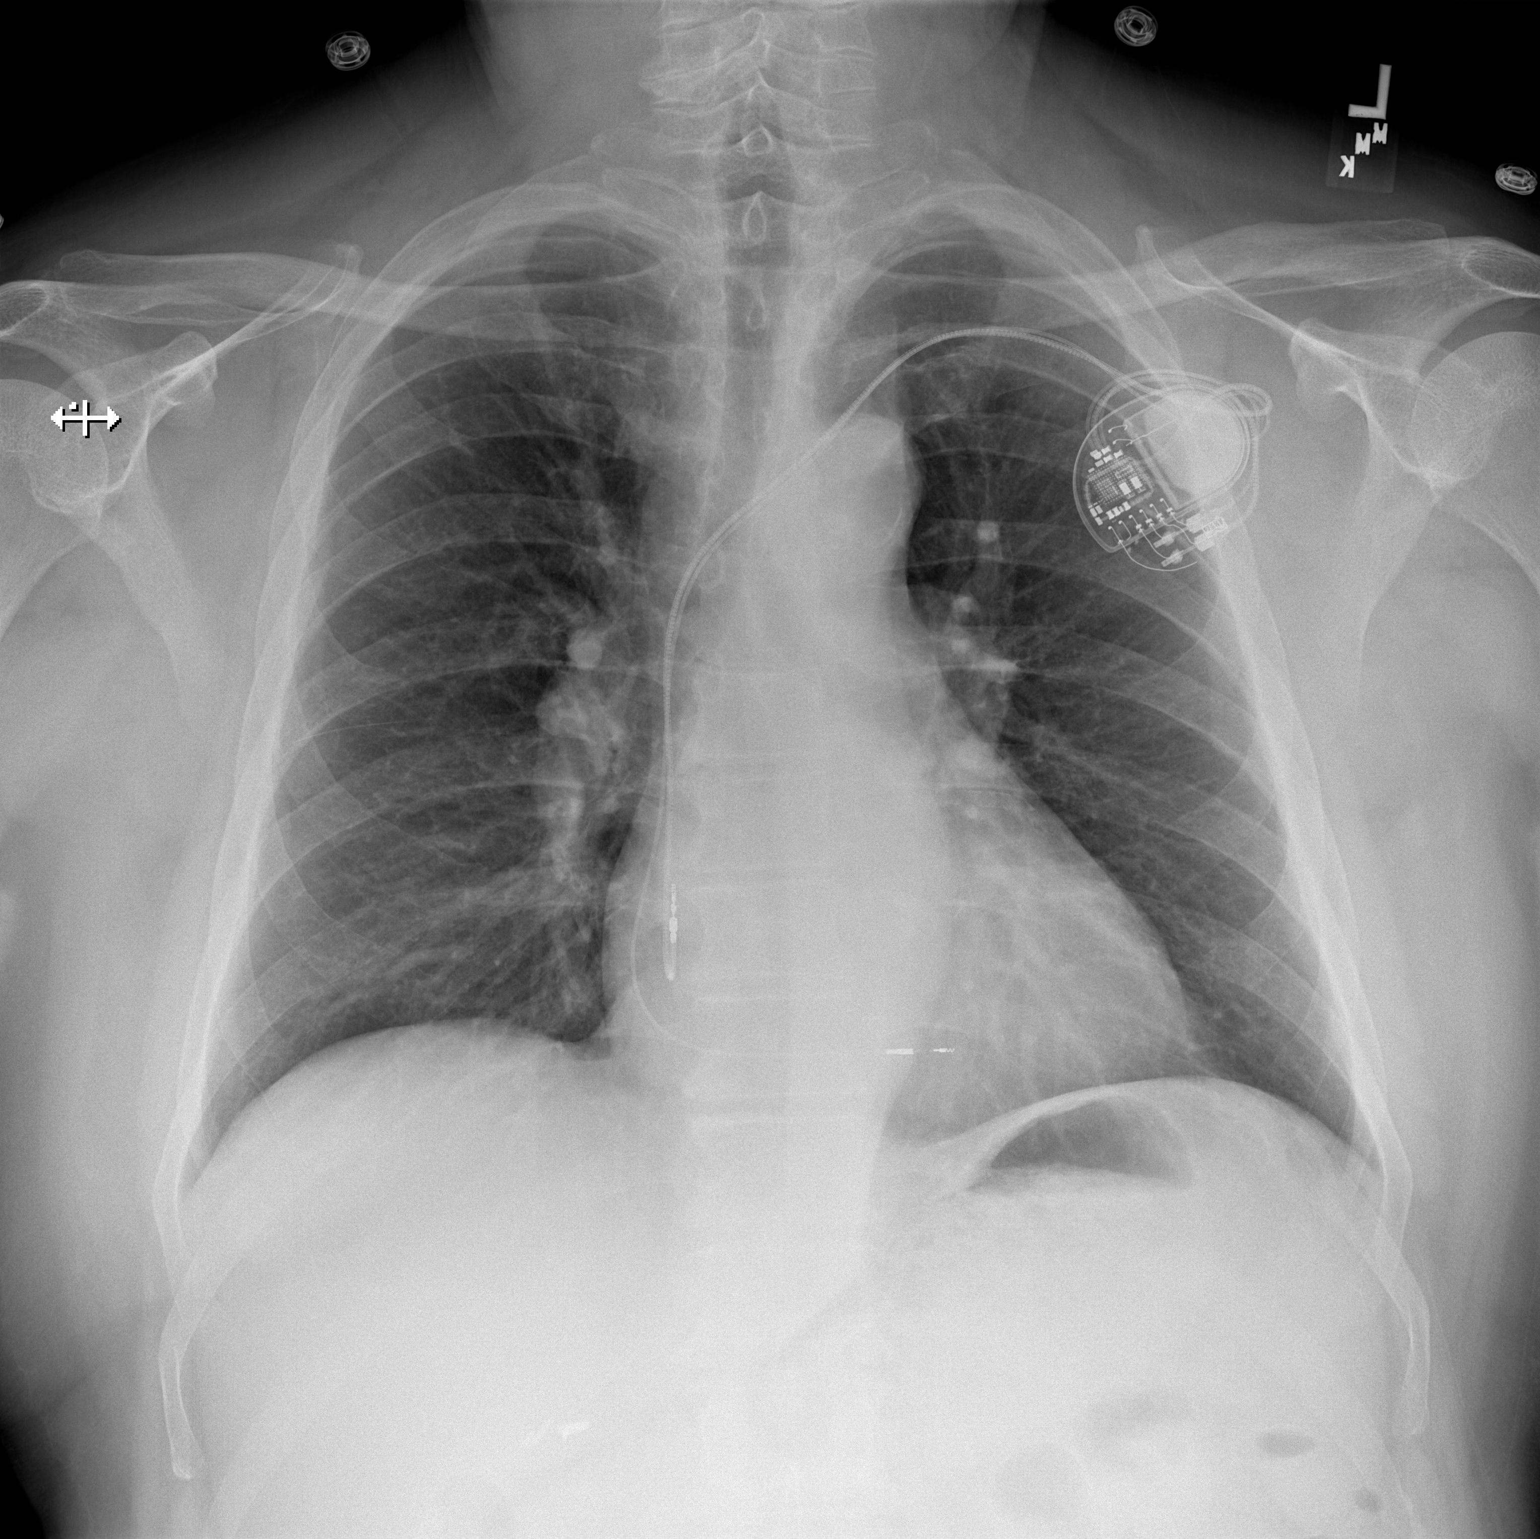

[w chest lat]
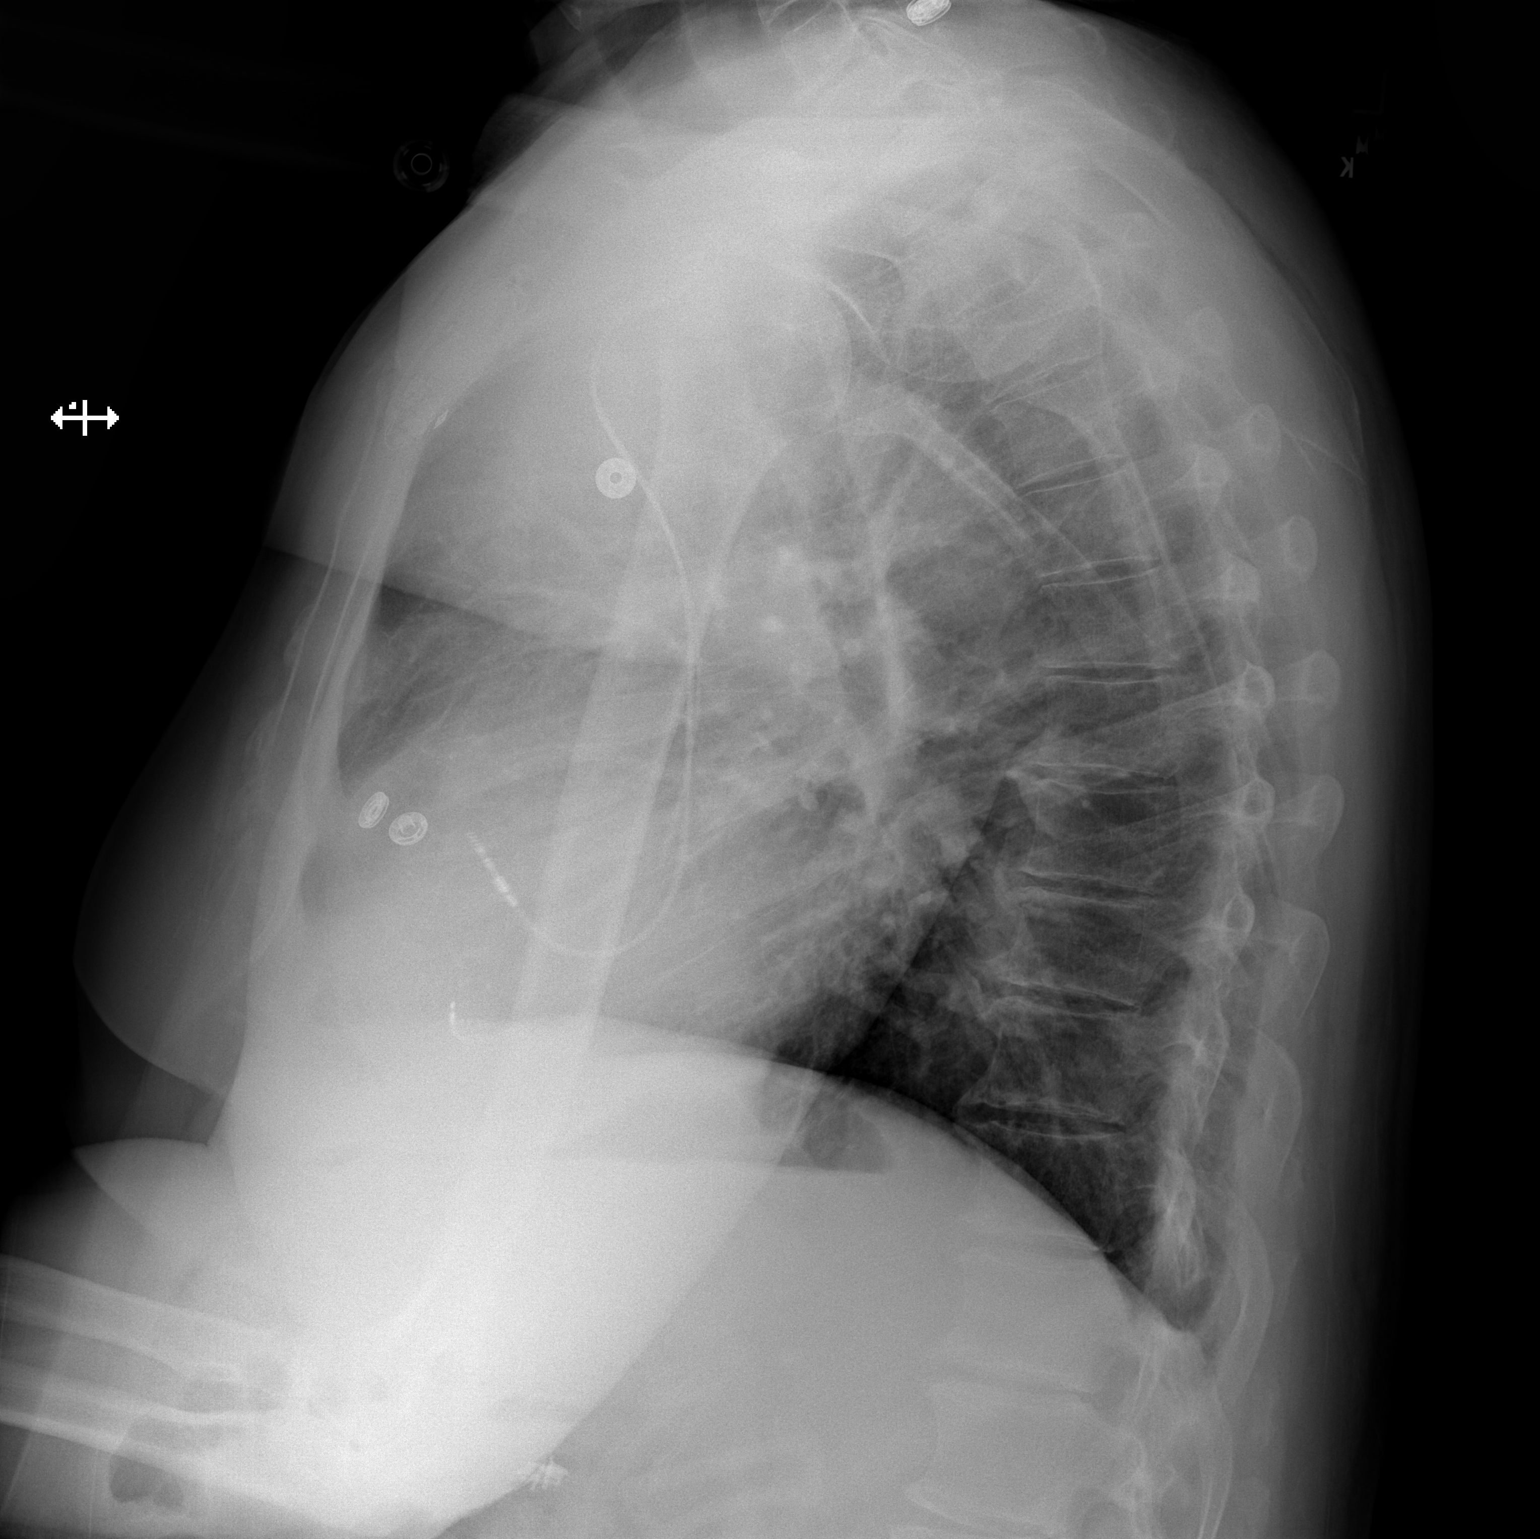

[2 of 2 positions shown; findings below may reference images not displayed]

FINDINGS: Left-sided pacing device with leads over right atrium and right
ventricle. No pneumothorax. No focal opacity, or pleural effusion.
Normal cardiac size. Aortic atherosclerosis.
IMPRESSION: Left pacemaker placement as above without visible pneumothorax.
Clear lung fields.

## 2022-03-10 ENCOUNTER — Ambulatory Visit (INDEPENDENT_AMBULATORY_CARE_PROVIDER_SITE_OTHER): Payer: Medicare Other

## 2022-03-10 DIAGNOSIS — I441 Atrioventricular block, second degree: Secondary | ICD-10-CM | POA: Diagnosis not present

## 2022-03-12 LAB — CUP PACEART REMOTE DEVICE CHECK
Battery Remaining Longevity: 139 mo
Battery Voltage: 3.04 V
Brady Statistic AP VP Percent: 1.12 %
Brady Statistic AP VS Percent: 0 %
Brady Statistic AS VP Percent: 98.28 %
Brady Statistic AS VS Percent: 0.6 %
Brady Statistic RA Percent Paced: 1.28 %
Brady Statistic RV Percent Paced: 99.4 %
Date Time Interrogation Session: 20230817142219
Implantable Lead Implant Date: 20220812
Implantable Lead Implant Date: 20220812
Implantable Lead Location: 753859
Implantable Lead Location: 753860
Implantable Lead Model: 3830
Implantable Lead Model: 5076
Implantable Pulse Generator Implant Date: 20220812
Lead Channel Impedance Value: 323 Ohm
Lead Channel Impedance Value: 361 Ohm
Lead Channel Impedance Value: 380 Ohm
Lead Channel Impedance Value: 608 Ohm
Lead Channel Pacing Threshold Amplitude: 1 V
Lead Channel Pacing Threshold Amplitude: 1 V
Lead Channel Pacing Threshold Pulse Width: 0.4 ms
Lead Channel Pacing Threshold Pulse Width: 0.4 ms
Lead Channel Sensing Intrinsic Amplitude: 1.875 mV
Lead Channel Sensing Intrinsic Amplitude: 1.875 mV
Lead Channel Sensing Intrinsic Amplitude: 11.125 mV
Lead Channel Sensing Intrinsic Amplitude: 11.125 mV
Lead Channel Setting Pacing Amplitude: 2.25 V
Lead Channel Setting Pacing Amplitude: 2.25 V
Lead Channel Setting Pacing Pulse Width: 0.4 ms
Lead Channel Setting Sensing Sensitivity: 1.2 mV

## 2022-04-13 NOTE — Progress Notes (Signed)
Remote pacemaker transmission.   

## 2022-06-09 ENCOUNTER — Ambulatory Visit (INDEPENDENT_AMBULATORY_CARE_PROVIDER_SITE_OTHER): Payer: Medicare Other

## 2022-06-09 DIAGNOSIS — Z950 Presence of cardiac pacemaker: Secondary | ICD-10-CM

## 2022-06-09 DIAGNOSIS — I442 Atrioventricular block, complete: Secondary | ICD-10-CM | POA: Diagnosis not present

## 2022-06-10 LAB — CUP PACEART REMOTE DEVICE CHECK
Battery Remaining Longevity: 136 mo
Battery Voltage: 3.03 V
Brady Statistic AP VP Percent: 1.59 %
Brady Statistic AP VS Percent: 0 %
Brady Statistic AS VP Percent: 97.8 %
Brady Statistic AS VS Percent: 0.6 %
Brady Statistic RA Percent Paced: 1.82 %
Brady Statistic RV Percent Paced: 99.4 %
Date Time Interrogation Session: 20231115123142
Implantable Lead Connection Status: 753985
Implantable Lead Connection Status: 753985
Implantable Lead Implant Date: 20220812
Implantable Lead Implant Date: 20220812
Implantable Lead Location: 753859
Implantable Lead Location: 753860
Implantable Lead Model: 3830
Implantable Lead Model: 5076
Implantable Pulse Generator Implant Date: 20220812
Lead Channel Impedance Value: 323 Ohm
Lead Channel Impedance Value: 361 Ohm
Lead Channel Impedance Value: 456 Ohm
Lead Channel Impedance Value: 608 Ohm
Lead Channel Pacing Threshold Amplitude: 1.125 V
Lead Channel Pacing Threshold Amplitude: 1.125 V
Lead Channel Pacing Threshold Pulse Width: 0.4 ms
Lead Channel Pacing Threshold Pulse Width: 0.4 ms
Lead Channel Sensing Intrinsic Amplitude: 1.75 mV
Lead Channel Sensing Intrinsic Amplitude: 1.75 mV
Lead Channel Sensing Intrinsic Amplitude: 11.125 mV
Lead Channel Sensing Intrinsic Amplitude: 11.125 mV
Lead Channel Setting Pacing Amplitude: 2.25 V
Lead Channel Setting Pacing Amplitude: 2.25 V
Lead Channel Setting Pacing Pulse Width: 0.4 ms
Lead Channel Setting Sensing Sensitivity: 1.2 mV
Zone Setting Status: 755011

## 2022-06-23 NOTE — Progress Notes (Signed)
Cardiology Office Note Date:  06/24/2022  Patient ID:  Brandon Soto, Brandon Soto 12-03-1945, MRN [id number] PCP:  Melida Quitter, MD  Cardiologist:  Dr. Allyson Sabal Electrophysiologist: Dr. Lalla Brothers    Chief Complaint: planned f/u  History of Present Illness: Brandon Soto is a 76 y.o. 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 Lalla Brothers Dec 2022, post PPM implant doing well.No changes were made.  He saw Dr. Allyson Sabal Jan 2023, also doing well, no changes were made.  TODAY He continues to do well. NO CP, palpitations or cardiac awareness. No SOB No bleeding or signs of bleeding  His PMD does labs every 77mo  Device information MDT dual chamber PPM implanted 03/07/21   Past Medical History:  Diagnosis Date   BPH (benign prostatic hyperplasia)    Cataract    bilateral   Coronary artery disease    s/p LAD intervention 2001   CVA (cerebral infarction)    Dyslipidemia    GERD (gastroesophageal reflux disease)    Hypertension    Hypothyroidism    "shrank w/radiation ?1990's" (06/27/2012)   Left knee DJD    Myocardial infarction (HCC)    OSA (obstructive sleep apnea) 11/29/2014   Osteoarthritis    PONV (postoperative nausea and vomiting)    Sleep apnea    cpap   Small intestinal bacterial overgrowth 07/05/2018   Stroke [facility])     Past Surgical History:  Procedure Laterality Date   angioplasty N/A    CARDIAC CATHETERIZATION  ~ 2009   CHOLECYSTECTOMY  ~ 2006   COLONOSCOPY  06/2014   CORONARY ANGIOPLASTY     CORONARY ANGIOPLASTY WITH STENT PLACEMENT  2001   "1" (06/27/2012)   LOOP RECORDER IMPLANT  12-15-13   MDT LinQ implanted by Dr Johney Frame for cryptogenic stroke   LOOP RECORDER IMPLANT N/A 12/15/2013   Procedure: LOOP RECORDER IMPLANT;  Surgeon: Gardiner Rhyme, MD;  Location: [facility] CATH LAB;  Service: Cardiovascular;  Laterality: N/A;   LOOP RECORDER REMOVAL N/A 03/07/2021   Procedure: LOOP RECORDER REMOVAL;   Surgeon: Lanier Prude, MD;  Location: [facility] INVASIVE CV LAB;  Service: Cardiovascular;  Laterality: N/A;   MM CORP SCREENING MAMMO BIL/CAD ([facility] HX)     PACEMAKER IMPLANT N/A 03/07/2021   Procedure: PACEMAKER IMPLANT;  Surgeon: Lanier Prude, MD;  Location: [facility] INVASIVE CV LAB;  Service: Cardiovascular;  Laterality: N/A;   PARTIAL KNEE ARTHROPLASTY  06/27/2012   Procedure: UNICOMPARTMENTAL KNEE;  Surgeon: Nilda Simmer, MD;  Location: [facility] OR;  Service: Orthopedics;  Laterality: Left;  left unicompartmental knee   SHOULDER ARTHROSCOPY WITH ROTATOR CUFF REPAIR AND SUBACROMIAL DECOMPRESSION Left 2005   (06/27/2012)   stent placement N/A    TEE WITHOUT CARDIOVERSION N/A 01/03/2014   Procedure: TRANSESOPHAGEAL ECHOCARDIOGRAM (TEE);  Surgeon: Thurmon Fair, MD;  Location: [facility] ENDOSCOPY;  Service: Cardiovascular;  Laterality: N/A;    Current Outpatient Medications  Medication Sig Dispense Refill   acetaminophen (TYLENOL) 500 MG tablet Take 1,000 mg by mouth every 6 (six) hours as needed for mild pain (For pain in hands).     amLODipine (NORVASC) 10 MG tablet Take 5 mg by mouth daily.     chlorthalidone (HYGROTON) 25 MG tablet Take 1 tablet (25 mg total) by mouth daily. 90 tablet 3   Cyanocobalamin (B-12) 5000 MCG CAPS Take 5,000 mcg by mouth daily.     esomeprazole (NEXIUM) 40  MG capsule Take 40 mg by mouth daily at 6 (six) AM.     gabapentin (NEURONTIN) 600 MG tablet Take 600 mg by mouth 2 (two) times daily.     JARDIANCE 10 MG TABS tablet Take 10 mg by mouth daily.     levothyroxine (SYNTHROID, LEVOTHROID) 175 MCG tablet Take 175 mcg by mouth daily before breakfast.     losartan (COZAAR) 100 MG tablet Take 1 tablet (100 mg total) by mouth daily. 90 tablet 3   Omega-3 Fatty Acids ([employer] FISH OIL) 1000 MG CAPS Take 1,000 mg by mouth daily.     rosuvastatin (CRESTOR) 20 MG tablet TAKE 1 TABLET BY MOUTH AT  BEDTIME 90 tablet 3   tamsulosin (FLOMAX) 0.4 MG CAPS capsule Take 0.4 mg by mouth at  bedtime.     XARELTO 20 MG TABS tablet TAKE 1 TABLET BY MOUTH  DAILY WITH SUPPER 90 tablet 3   No current facility-administered medications for this visit.    Allergies:   Patient has no known allergies.   Social History:  The patient  reports that he has never smoked. He has never used smokeless tobacco. He reports that he does not drink alcohol and does not use drugs.   Family History:  The patient's family history includes Cancer - Lung in his brother; Esophageal cancer in his brother; Heart attack in his mother; Heart disease in his brother; Liver cancer in his brother.  ROS:  Please see the history of present illness.    All other systems are reviewed and otherwise negative.   PHYSICAL EXAM:  VS:  BP 120/72   Pulse 69   Ht 6\' 1"  (1.854 m)   Wt 236 lb (107 kg)   SpO2 97%   BMI 31.14 kg/m  BMI: Body mass index is 31.14 kg/m. Well nourished, well developed, in no acute distress HEENT: normocephalic, atraumatic Neck: no JVD, carotid bruits or masses Cardiac:  RRR; no significant murmurs, no rubs, or gallops Lungs:  CTA b/l, no wheezing, rhonchi or rales Abd: soft, nontender MS: no deformity or atrophy Ext: no edema Skin: warm and dry, no rash Neuro:  No gross deficits appreciated Psych: euthymic mood, full affect  PPM site is stable, no tethering or discomfort   EKG:  not done today  Device interrogation done today and reviewed by myself:  Battery and lead measurements are good AF burden <0.1% No HVR episodes   01/21/21; TTE 1. Left ventricular ejection fraction, by estimation, is 60 to 65%. The  left ventricle has normal function. The left ventricle has no regional  wall motion abnormalities. There is mild concentric left ventricular  hypertrophy. Left ventricular diastolic  parameters are indeterminate.   2. Right ventricular systolic function is normal. The right ventricular  size is normal. Tricuspid regurgitation signal is inadequate for assessing  PA  pressure.   3. Left atrial size was moderately dilated.   4. Right atrial size was mild to moderately dilated.   5. The mitral valve is normal in structure. No evidence of mitral valve  regurgitation. No evidence of mitral stenosis.   6. The aortic valve is tricuspid. Aortic valve regurgitation is not  visualized. No aortic stenosis is present.   7. The inferior vena cava is normal in size with greater than 50%  respiratory variability, suggesting right atrial pressure of 3 mmHg.   Comparison(s): A prior study was performed on 10/13/2019. Prior images  reviewed side by side. Similar from prior.   Recent Labs:  No results found for requested labs within last 365 days.  No results found for requested labs within last 365 days.   CrCl cannot be calculated (Patient's most recent lab result is older than the maximum 21 days allowed.).   Wt Readings from Last 3 Encounters:  06/24/22 236 lb (107 kg)  08/06/21 238 lb 9.6 oz (108.2 kg)  07/03/21 235 lb (106.6 kg)     Other studies reviewed: Additional studies/records reviewed today include: summarized above  ASSESSMENT AND PLAN:  PPM Intact function No programming changes made  Paroxysmal Afib CHA2DS2Vasc is 7, on Xarelto, appropriately dosed <0.1 % burden  CAD No anginal symptoms Sees Dr. Allyson Sabal in Jan  HTN Looks OK  5. HLD Not addressed today   Disposition: F/u with remotes as usual and in clinic in 1 year, sooner if needed  Current medicines are reviewed at length with the patient today.  The patient did not have any concerns regarding medicines.  Norma Fredrickson, PA-C 06/24/2022 10:02 AM     [facility] [address] [place] [address] [phone] (office)  [phone] (fax)

## 2022-06-24 ENCOUNTER — Encounter: Payer: Self-pay | Admitting: Physician Assistant

## 2022-06-24 ENCOUNTER — Ambulatory Visit: Payer: Medicare Other | Attending: Physician Assistant | Admitting: Physician Assistant

## 2022-06-24 VITALS — BP 120/72 | HR 69 | Ht 73.0 in | Wt 236.0 lb

## 2022-06-24 DIAGNOSIS — I10 Essential (primary) hypertension: Secondary | ICD-10-CM

## 2022-06-24 DIAGNOSIS — Z950 Presence of cardiac pacemaker: Secondary | ICD-10-CM | POA: Diagnosis not present

## 2022-06-24 DIAGNOSIS — I480 Paroxysmal atrial fibrillation: Secondary | ICD-10-CM

## 2022-06-24 DIAGNOSIS — I2510 Atherosclerotic heart disease of native coronary artery without angina pectoris: Secondary | ICD-10-CM

## 2022-06-24 LAB — CUP PACEART INCLINIC DEVICE CHECK
Battery Remaining Longevity: 137 mo
Battery Voltage: 3.02 V
Brady Statistic AP VP Percent: 1.36 %
Brady Statistic AP VS Percent: 0 %
Brady Statistic AS VP Percent: 97.86 %
Brady Statistic AS VS Percent: 0.77 %
Brady Statistic RA Percent Paced: 1.58 %
Brady Statistic RV Percent Paced: 99.22 %
Date Time Interrogation Session: 20231129104400
Implantable Lead Connection Status: 753985
Implantable Lead Connection Status: 753985
Implantable Lead Implant Date: 20220812
Implantable Lead Implant Date: 20220812
Implantable Lead Location: 753859
Implantable Lead Location: 753860
Implantable Lead Model: 3830
Implantable Lead Model: 5076
Implantable Pulse Generator Implant Date: 20220812
Lead Channel Impedance Value: 323 Ohm
Lead Channel Impedance Value: 361 Ohm
Lead Channel Impedance Value: 494 Ohm
Lead Channel Impedance Value: 646 Ohm
Lead Channel Pacing Threshold Amplitude: 1.125 V
Lead Channel Pacing Threshold Amplitude: 1.25 V
Lead Channel Pacing Threshold Pulse Width: 0.4 ms
Lead Channel Pacing Threshold Pulse Width: 0.4 ms
Lead Channel Sensing Intrinsic Amplitude: 0.75 mV
Lead Channel Sensing Intrinsic Amplitude: 1.625 mV
Lead Channel Sensing Intrinsic Amplitude: 11.125 mV
Lead Channel Sensing Intrinsic Amplitude: 11.125 mV
Lead Channel Setting Pacing Amplitude: 2.25 V
Lead Channel Setting Pacing Amplitude: 2.5 V
Lead Channel Setting Pacing Pulse Width: 0.4 ms
Lead Channel Setting Sensing Sensitivity: 1.2 mV
Zone Setting Status: 755011

## 2022-06-24 NOTE — Patient Instructions (Signed)
Medication Instructions:   Your physician recommends that you continue on your current medications as directed. Please refer to the Current Medication list given to you today.   *If you need a refill on your cardiac medications before your next appointment, please call your pharmacy*   Lab Work: NONE ORDERED  TODAY    If you have labs (blood work) drawn today and your tests are completely normal, you will receive your results only by: MyChart Message (if you have MyChart) OR A paper copy in the mail If you have any lab test that is abnormal or we need to change your treatment, we will call you to review the results.   Testing/Procedures: NONE ORDERED  TODAY     Follow-Up: At [facility], you and your health needs are our priority.  As part of our continuing mission to provide you with exceptional heart care, we have created designated Provider Care Teams.  These Care Teams include your primary Cardiologist (physician) and Advanced Practice Providers (APPs -  Physician Assistants and Nurse Practitioners) who all work together to provide you with the care you need, when you need it.  We recommend signing up for the patient portal called "MyChart".  Sign up information is provided on this After Visit Summary.  MyChart is used to connect with patients for Virtual Visits (Telemedicine).  Patients are able to view lab/test results, encounter notes, upcoming appointments, etc.  Non-urgent messages can be sent to your provider as well.   To learn more about what you can do with MyChart, go to [email or web address].    Your next appointment:   1 year(s)  The format for your next appointment:   In Person  Provider:   You may see CAMERON T LAMBERT, MD or one of the following Advanced Practice Providers on your designated Care Team:   Renee Ursuy, PA-C   Other Instructions   Important Information About Sugar       

## 2022-07-06 NOTE — Progress Notes (Signed)
Remote pacemaker transmission.   

## 2022-07-28 ENCOUNTER — Other Ambulatory Visit: Payer: Self-pay | Admitting: Cardiovascular Disease

## 2022-07-28 NOTE — Telephone Encounter (Signed)
Prescription refill request for Xarelto received.  Indication: Afib  Last office visit: 06/24/22 Charlcie Cradle)  Weight: 107kg Age: 77 Scr: 1.00 (03/04/21 via [facility])  CrCl: 93.74ml/min  Labs overdue. Pt has appt with Dr Gwenlyn Found on 08/04/22. Note placed on appt to have labs drawn. Refill sent.

## 2022-08-04 ENCOUNTER — Encounter: Payer: Self-pay | Admitting: Cardiovascular Disease

## 2022-08-04 ENCOUNTER — Ambulatory Visit: Payer: Medicare Other | Attending: Cardiovascular Disease | Admitting: Cardiovascular Disease

## 2022-08-04 VITALS — BP 118/66 | HR 61 | Ht 73.0 in | Wt 234.0 lb

## 2022-08-04 DIAGNOSIS — E7800 Pure hypercholesterolemia, unspecified: Secondary | ICD-10-CM | POA: Diagnosis not present

## 2022-08-04 DIAGNOSIS — G4733 Obstructive sleep apnea (adult) (pediatric): Secondary | ICD-10-CM

## 2022-08-04 DIAGNOSIS — I480 Paroxysmal atrial fibrillation: Secondary | ICD-10-CM | POA: Diagnosis not present

## 2022-08-04 DIAGNOSIS — I2510 Atherosclerotic heart disease of native coronary artery without angina pectoris: Secondary | ICD-10-CM

## 2022-08-04 DIAGNOSIS — I10 Essential (primary) hypertension: Secondary | ICD-10-CM | POA: Diagnosis not present

## 2022-08-04 DIAGNOSIS — I442 Atrioventricular block, complete: Secondary | ICD-10-CM

## 2022-08-04 LAB — CBC

## 2022-08-04 MED ORDER — AMLODIPINE BESYLATE 5 MG PO TABS: 5.0000 mg | ORAL_TABLET | Freq: Every day | ORAL | 3 refills | Status: DC

## 2022-08-04 NOTE — Progress Notes (Unsigned)
08/04/2022 Brandon Soto   04-Mar-1946  [id number]  Primary Physician Brandon Rubins Nyoka Cowden, MD Primary Cardiologist: Brandon Gess MD FACP, Brandon Soto, Brandon Soto, [place]  HPI:  Brandon Soto is a 77 y.o.   mildly overweight, married, Caucasian male father of 2, grandfather to 3 grandchildren who I last saw in the office 08/06/2021.  His wife Brandon Soto is also a patient of mine... He has a history of CAD status post LAD intervention by Dr. Jorje Soto in 2001 in the setting of a myocardial infarction. I catheterization him in 2004 revealing a patent stent with an anteroapical wall motion abnormality and EF of 45-50%. His other problems include hypertension, hyperlipidemia, and hypothyroidism. He denies chest pain or shortness of breath. His last Myoview performed in October of 2011 showed apical scar, and echo showed a normal EF. His most recent lab work revealed a total cholesterol of 146, LDL of 72, and HDL 49.he had a left total knee replacement performed by Dr. Hadassah Soto 06/27/12 which was uncomplicated. He denies chest pain or shortness of breath. His primary care physician, Brandon Soto, follows his lipid profile closely. He had a recent stroke apparently in 2 vascular territories. Workup has been unrevealing including carotid Dopplers which were essentially normal and a transthoracic echo that did not show an embolic source. Brandon Soto placed a loop recorder to rule out paroxysmal atrial fibrillation as a cause. He was originally placed on aspirin and Plavix however the loop recorder did reveal short bursts of A. fib and he was switched to Xarelto. He also has had a sleep study and has been placed on C Pap followed by Brandon Soto.    He has noticed some increasing dyspnea on exertion and some bradycardia.    He does have a history of thyroid disease as well.  He denies chest pain.  Recent 2D echo performed 10/13/2019 revealed normal LV systolic function with grade 1 diastolic dysfunction.   I performed an event  monitoring revealing episodes of complete heart block with pauses longest 7 seconds and multiple shorter pauses but still significant.  He is not on any negative chronotropic drugs.  He was seen by Brandon Soto in for evaluation of this he did not feel that he was a candidate for device implantation.  Most of his long pauses were in the a.m. hours.   Since I saw him a year ago he has done fine.  Dr. Lalla Soto did implant a pacemaker 03/07/2021 with removal of his loop recorder for complete heart block.  He feels clinically improved.  He denies chest pain or shortness of breath.    Current Meds  Medication Sig   acetaminophen (TYLENOL) 500 MG tablet Take 1,000 mg by mouth every 6 (six) hours as needed for mild pain (For pain in hands).   amLODipine (NORVASC) 10 MG tablet Take 5 mg by mouth daily.   chlorthalidone (HYGROTON) 25 MG tablet Take 1 tablet (25 mg total) by mouth daily.   Cyanocobalamin (B-12) 5000 MCG CAPS Take 5,000 mcg by mouth daily.   esomeprazole (NEXIUM) 40 MG capsule Take 40 mg by mouth daily at 6 (six) AM.   gabapentin (NEURONTIN) 600 MG tablet Take 600 mg by mouth 2 (two) times daily.   JARDIANCE 10 MG TABS tablet Take 10 mg by mouth daily.   levothyroxine (SYNTHROID, LEVOTHROID) 175 MCG tablet Take 175 mcg by mouth daily before breakfast.   losartan (COZAAR) 100 MG tablet Take 1 tablet (100 mg  total) by mouth daily.   Omega-3 Fatty Acids ([employer] FISH OIL) 1000 MG CAPS Take 1,000 mg by mouth daily.   rivaroxaban (XARELTO) 20 MG TABS tablet TAKE 1 TABLET BY MOUTH  DAILY WITH SUPPER   rosuvastatin (CRESTOR) 20 MG tablet TAKE 1 TABLET BY MOUTH AT  BEDTIME   tamsulosin (FLOMAX) 0.4 MG CAPS capsule Take 0.4 mg by mouth at bedtime.     No Known Allergies  Social History   Socioeconomic History   Marital status: Married    Spouse name: Not on file   Number of children: 2   Years of education: Not on file   Highest education level: Not on file  Occupational History   Occupation:  retired  Tobacco Use   Smoking status: Never   Smokeless tobacco: Never  Vaping Use   Vaping Use: Never used  Substance and Sexual Activity   Alcohol use: No    Alcohol/week: 0.0 standard drinks of alcohol   Drug use: No   Sexual activity: Yes  Other Topics Concern   Not on file  Social History Narrative   Married and retired, 2 children   Stays busy keeping up his [place] home plus he has a home in [place] and takes care of his mother-in-law's home   No alcohol tobacco or drug use   Social Determinants of Radio broadcast assistant Strain: Not on file  Food Insecurity: Not on file  Transportation Needs: Not on file  Physical Activity: Not on file  Stress: Not on file  Social Connections: Not on file  Intimate Partner Violence: Not on file     Review of Systems: General: negative for chills, fever, night sweats or weight changes.  Cardiovascular: negative for chest pain, dyspnea on exertion, edema, orthopnea, palpitations, paroxysmal nocturnal dyspnea or shortness of breath Dermatological: negative for rash Respiratory: negative for cough or wheezing Urologic: negative for hematuria Abdominal: negative for nausea, vomiting, diarrhea, bright red blood per rectum, melena, or hematemesis Neurologic: negative for visual changes, syncope, or dizziness All other systems reviewed and are otherwise negative except as noted above.    Blood pressure 118/66, pulse 61, height 6\' 1"  (1.854 m), weight 234 lb (106.1 kg), SpO2 96 %.  General appearance: alert and no distress Neck: no adenopathy, no carotid bruit, no JVD, supple, symmetrical, trachea midline, and thyroid not enlarged, symmetric, no tenderness/mass/nodules Lungs: clear to auscultation bilaterally Heart: regular rate and rhythm, S1, S2 normal, no murmur, click, rub or gallop Extremities: extremities normal, atraumatic, no cyanosis or edema Pulses: 2+ and symmetric Skin: Skin color, texture, turgor normal.  No rashes or lesions Neurologic: Grossly normal  EKG paced rhythm at 61.  Personally reviewed this EKG.  ASSESSMENT AND PLAN:   Hypertension History of essential hypertension a blood pressure measured today at 118/66.  He is on amlodipine, chlorthalidone and losartan.  Hypercholesteremia History of hyperlipidemia on statin therapy with lipid profile performed 10//23 revealing total cholesterol of 143, LDL 61 and HDL 47.  OSA (obstructive sleep apnea) History of obstructive sleep apnea on CPAP followed by Dr. Radford Pax.  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 Quentin Ore 03/07/2021 with removal of his loop recorder.  He feels clinically improved since that time.  Coronary artery disease His CAD status post LAD stenting by Dr. Chalmers Cater in 2001 in the setting of STEMI.  I catheterized him in 2004 revealing a patent stent with anteroapical wall motion  abnormality.  He denies chest pain.     Brandon Gess MD FACP,FACC,FAHA, [facility] 08/04/2022 11:25 AM

## 2022-08-04 NOTE — Patient Instructions (Signed)
Medication Instructions:  Your physician recommends that you continue on your current medications as directed. Please refer to the Current Medication list given to you today.  *If you need a refill on your cardiac medications before your next appointment, please call your pharmacy*   Lab Work: Your physician recommends that you have labs drawn today: BMET & CBC  If you have labs (blood work) drawn today and your tests are completely normal, you will receive your results only by: [place] (if you have MyChart) OR A paper copy in the mail If you have any lab test that is abnormal or we need to change your treatment, we will call you to review the results.   Follow-Up: At [facility], you and your health needs are our priority.  As part of our continuing mission to provide you with exceptional heart care, we have created designated Provider Care Teams.  These Care Teams include your primary Cardiologist (physician) and Advanced Practice Providers (APPs -  Physician Assistants and Nurse Practitioners) who all work together to provide you with the care you need, when you need it.  We recommend signing up for the patient portal called "MyChart".  Sign up information is provided on this After Visit Summary.  MyChart is used to connect with patients for Virtual Visits (Telemedicine).  Patients are able to view lab/test results, encounter notes, upcoming appointments, etc.  Non-urgent messages can be sent to your provider as well.   To learn more about what you can do with MyChart, go to [email or web address].    Your next appointment:   12 month(s)  The format for your next appointment:   In Person  Provider:   Quay Burow, MD

## 2022-08-04 NOTE — Assessment & Plan Note (Signed)
History of hyperlipidemia on statin therapy with lipid profile performed 10//23 revealing total cholesterol of 143, LDL 61 and HDL 47.

## 2022-08-04 NOTE — Assessment & Plan Note (Signed)
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 Quentin Ore 03/07/2021 with removal of his loop recorder.  He feels clinically improved since that time.

## 2022-08-04 NOTE — Assessment & Plan Note (Signed)
His CAD status post LAD stenting by Dr. Chalmers Cater in 2001 in the setting of STEMI.  I catheterized him in 2004 revealing a patent stent with anteroapical wall motion abnormality.  He denies chest pain.

## 2022-08-04 NOTE — Assessment & Plan Note (Signed)
History of essential hypertension a blood pressure measured today at 118/66.  He is on amlodipine, chlorthalidone and losartan.

## 2022-08-04 NOTE — Assessment & Plan Note (Signed)
History of obstructive sleep apnea on CPAP followed by Dr. Turner. 

## 2022-08-05 LAB — BASIC METABOLIC PANEL
BUN/Creatinine Ratio: 22 (ref 10–24)
BUN: 20 mg/dL (ref 8–27)
CO2: 25 mmol/L (ref 20–29)
Calcium: 10 mg/dL (ref 8.6–10.2)
Chloride: 94 mmol/L — ABNORMAL LOW (ref 96–106)
Creatinine, Ser: 0.92 mg/dL (ref 0.76–1.27)
Glucose: 138 mg/dL — ABNORMAL HIGH (ref 70–99)
Potassium: 3.8 mmol/L (ref 3.5–5.2)
Sodium: 136 mmol/L (ref 134–144)
eGFR: 86 mL/min/{1.73_m2} (ref >59)

## 2022-08-05 LAB — CBC
Hematocrit: 48.3 % (ref 37.5–51.0)
Hemoglobin: 16.2 g/dL (ref 13.0–17.7)
MCH: 32.2 pg (ref 26.6–33.0)
MCHC: 33.5 g/dL (ref 31.5–35.7)
MCV: 96 fL (ref 79–97)
Platelets: 165 10*3/uL (ref 150–450)
RBC: 5.03 x10E6/uL (ref 4.14–5.80)
RDW: 12.8 % (ref 11.6–15.4)
WBC: 9.8 10*3/uL (ref 3.4–10.8)

## 2022-08-06 ENCOUNTER — Other Ambulatory Visit: Payer: Self-pay | Admitting: Urology

## 2022-08-06 DIAGNOSIS — R972 Elevated prostate specific antigen [PSA]: Secondary | ICD-10-CM

## 2022-08-18 ENCOUNTER — Other Ambulatory Visit (HOSPITAL_COMMUNITY): Payer: Self-pay | Admitting: Urology

## 2022-08-18 DIAGNOSIS — R972 Elevated prostate specific antigen [PSA]: Secondary | ICD-10-CM

## 2022-09-08 ENCOUNTER — Ambulatory Visit: Payer: Medicare Other

## 2022-09-08 DIAGNOSIS — I441 Atrioventricular block, second degree: Secondary | ICD-10-CM

## 2022-09-11 LAB — CUP PACEART REMOTE DEVICE CHECK
Battery Remaining Longevity: 133 mo
Battery Voltage: 3.02 V
Brady Statistic AP VP Percent: 2.63 %
Brady Statistic AP VS Percent: 0 %
Brady Statistic AS VP Percent: 96.24 %
Brady Statistic AS VS Percent: 1.12 %
Brady Statistic RA Percent Paced: 3.14 %
Brady Statistic RV Percent Paced: 98.88 %
Date Time Interrogation Session: 20240215225142
Implantable Lead Connection Status: 753985
Implantable Lead Connection Status: 753985
Implantable Lead Implant Date: 20220812
Implantable Lead Implant Date: 20220812
Implantable Lead Location: 753859
Implantable Lead Location: 753860
Implantable Lead Model: 3830
Implantable Lead Model: 5076
Implantable Pulse Generator Implant Date: 20220812
Lead Channel Impedance Value: 323 Ohm
Lead Channel Impedance Value: 361 Ohm
Lead Channel Impedance Value: 532 Ohm
Lead Channel Impedance Value: 608 Ohm
Lead Channel Pacing Threshold Amplitude: 1.125 V
Lead Channel Pacing Threshold Amplitude: 1.125 V
Lead Channel Pacing Threshold Pulse Width: 0.4 ms
Lead Channel Pacing Threshold Pulse Width: 0.4 ms
Lead Channel Sensing Intrinsic Amplitude: 10.875 mV
Lead Channel Sensing Intrinsic Amplitude: 10.875 mV
Lead Channel Sensing Intrinsic Amplitude: 2 mV
Lead Channel Sensing Intrinsic Amplitude: 2 mV
Lead Channel Setting Pacing Amplitude: 2.25 V
Lead Channel Setting Pacing Amplitude: 2.5 V
Lead Channel Setting Pacing Pulse Width: 0.4 ms
Lead Channel Setting Sensing Sensitivity: 1.2 mV
Zone Setting Status: 755011

## 2022-09-25 ENCOUNTER — Ambulatory Visit (HOSPITAL_COMMUNITY)
Admission: RE | Admit: 2022-09-25 | Discharge: 2022-09-25 | Disposition: A | Discharge status: Home / Self Care | Payer: Medicare Other | Source: Ambulatory Visit | Attending: Urology | Admitting: Urology

## 2022-09-25 DIAGNOSIS — R972 Elevated prostate specific antigen [PSA]: Secondary | ICD-10-CM | POA: Diagnosis present

## 2022-09-25 MED ORDER — GADOBUTROL 1 MMOL/ML IV SOLN
10.0000 mL | Freq: Once | INTRAVENOUS | Status: AC | PRN
  Administered 2022-09-25: 10 mL via INTRAVENOUS

## 2022-09-25 NOTE — Progress Notes (Signed)
Informed of MRI for today.   Device system confirmed to be MRI conditional, with implant date > 6 weeks ago, and no evidence of abandoned or epicardial leads in review of most recent CXR Interrogation from today reviewed, pt is currently AS-VP at ~ ~60 bpm  Change device settings for MRI to DOO 85.   Tachy-therapies to off if applicable.  Program device back to pre-MRI settings after completion of exam.  Annamaria Helling  09/25/2022 1:35 PM

## 2022-09-25 NOTE — Progress Notes (Signed)
Per order, changed device settings for MRI to DOO 85.   Tachy-therapies to off if applicable.   Program device back to pre-MRI settings after completion of exam.

## 2022-10-01 ENCOUNTER — Telehealth: Payer: Self-pay | Admitting: Cardiovascular Disease

## 2022-10-01 NOTE — Telephone Encounter (Signed)
Patient with diagnosis of atrial fibriliation  on Xarelto for anticoagulation.    Procedure: MRI Fusion Biopsy  Date of procedure: TBD   CHA2DS2-VASc Score = 6   This indicates a 9.7% annual risk of stroke. The patient's score is based upon: CHF History: 0 HTN History: 1 Diabetes History: 0 Stroke History: 2 Vascular Disease History: 1 Age Score: 2 Gender Score: 0     CrCl 86 mL/min Platelet count 165 K (08/04/2022)  Per office protocol, patient can hold Xarelto for 3 days prior to procedure. Cleared for spinal procedures in May 2023 and March 2023 by Dr Gwenlyn Found. Given hx of prior stroke he should resume as soon as safely possible after the procedure    **This guidance is not considered finalized until pre-operative APP has relayed final recommendations.**

## 2022-10-01 NOTE — Telephone Encounter (Signed)
   Pre-operative Risk Assessment    Patient Name: Brandon Soto  DOB: November 02, 1945 MRN: [id number]      Request for Surgical Clearance    Procedure:   MRI Fusion Biopsy   Date of Surgery:  TBD                                 Surgeon:  Dr. Irine Seal Surgeon's Group or Practice Name:  [facility] Phone number:  [phone] Fax number:  [phone]   Type of Clearance Requested:   - Medical  - Pharmacy:  Hold Rivaroxaban (Xarelto) Hold 5 days prior   Type of Anesthesia:  None    Additional requests/questions:      Marquita Palms   10/01/2022, 8:22 AM

## 2022-10-02 NOTE — Telephone Encounter (Signed)
[facility] call and left a message letting them know we are waiting on a response from Dr. Gwenlyn Found.  Side note: Not sure who called from [facility].

## 2022-10-02 NOTE — Telephone Encounter (Signed)
[facility] calling back to get update. Please advise.

## 2022-10-03 NOTE — Telephone Encounter (Signed)
Dr. Gwenlyn Found is out of town.  I agree with the pharmacist.  Faythe Ghee to hold anticoagulation for 3 days.  Restart when safe postop can be up to 3 to 4 days if necessary.   Glenetta Hew, MD

## 2022-10-05 ENCOUNTER — Telehealth: Payer: Self-pay

## 2022-10-05 NOTE — Telephone Encounter (Signed)
  Patient Consent for Virtual Visit         Brandon Soto has provided verbal consent on 10/05/2022 for a virtual visit (video or telephone).   CONSENT FOR VIRTUAL VISIT FOR:  Brandon Soto  By participating in this virtual visit I agree to the following:  I hereby voluntarily request, consent and authorize [facility] and its employed or contracted physicians, physician assistants, nurse practitioners or other licensed health care professionals (the Practitioner), to provide me with telemedicine health care services (the "Services") as deemed necessary by the treating Practitioner. I acknowledge and consent to receive the Services by the Practitioner via telemedicine. I understand that the telemedicine visit will involve communicating with the Practitioner through live audiovisual communication technology and the disclosure of certain medical information by electronic transmission. I acknowledge that I have been given the opportunity to request an in-person assessment or other available alternative prior to the telemedicine visit and am voluntarily participating in the telemedicine visit.  I understand that I have the right to withhold or withdraw my consent to the use of telemedicine in the course of my care at any time, without affecting my right to future care or treatment, and that the Practitioner or I may terminate the telemedicine visit at any time. I understand that I have the right to inspect all information obtained and/or recorded in the course of the telemedicine visit and may receive copies of available information for a reasonable fee.  I understand that some of the potential risks of receiving the Services via telemedicine include:  Delay or interruption in medical evaluation due to technological equipment failure or disruption; Information transmitted may not be sufficient (e.g. poor resolution of images) to allow for appropriate medical decision making by the Practitioner;  and/or  In rare instances, security protocols could fail, causing a breach of personal health information.  Furthermore, I acknowledge that it is my responsibility to provide information about my medical history, conditions and care that is complete and accurate to the best of my ability. I acknowledge that Practitioner's advice, recommendations, and/or decision may be based on factors not within their control, such as incomplete or inaccurate data provided by me or distortions of diagnostic images or specimens that may result from electronic transmissions. I understand that the practice of medicine is not an exact science and that Practitioner makes no warranties or guarantees regarding treatment outcomes. I acknowledge that a copy of this consent can be made available to me via my patient portal ([facility]), or I can request a printed copy by calling the office of [facility].    I understand that my insurance will be billed for this visit.   I have read or had this consent read to me. I understand the contents of this consent, which adequately explains the benefits and risks of the Services being provided via telemedicine.  I have been provided ample opportunity to ask questions regarding this consent and the Services and have had my questions answered to my satisfaction. I give my informed consent for the services to be provided through the use of telemedicine in my medical care

## 2022-10-05 NOTE — Telephone Encounter (Signed)
   Name: Brandon Soto  DOB: September 11, 1945  MRN: [id number]  Primary Cardiologist: Quay Burow, MD   Preoperative team, please contact this patient and set up a phone call appointment for further preoperative risk assessment. Please obtain consent and complete medication review. Thank you for your help.  I confirm that guidance regarding antiplatelet and oral anticoagulation therapy has been completed and, if necessary, noted below.  Per Dr. Ellyn Hack I agree with the pharmacist. Faythe Ghee to hold anticoagulation for 3 days. Restart when safe postop can be up to 3 to 4 days if necessary.    Mable Fill, Marissa Nestle, NP 10/05/2022, 8:20 AM [place]

## 2022-10-05 NOTE — Telephone Encounter (Addendum)
Left message for the patient to contact the office.   Spoke with the patients wife, Ivin Booty, ok per DPR, she is agreeable with telehealth appointment due to the procedure is scheduled for 10/13/22. Advised her that we were unaware of procedure being scheduled. She gave consent for telehealth appointment and reviewed medications.   She will inform her spouse once he returns back if he has any questions.  They are out of town at [employer], but will be home soon.

## 2022-10-07 NOTE — Telephone Encounter (Signed)
[facility] called with update fax number, new fax number is [phone].   They want to hold Xarelto for 3 days.

## 2022-10-07 NOTE — Telephone Encounter (Signed)
Brandon Soto minutes ago (9:22 AM)   [facility] called with update fax number, new fax number is [phone].   They want to hold Xarelto for 3 days.

## 2022-10-07 NOTE — Telephone Encounter (Signed)
I have updated the clearance notes with the information given today.

## 2022-10-08 ENCOUNTER — Ambulatory Visit: Payer: Medicare Other | Attending: Nurse Practitioner | Admitting: Nurse Practitioner

## 2022-10-08 DIAGNOSIS — Z01810 Encounter for preprocedural cardiovascular examination: Secondary | ICD-10-CM | POA: Diagnosis not present

## 2022-10-08 NOTE — Progress Notes (Signed)
Virtual Visit via Telephone Note   Because of Brandon Soto co-morbid illnesses, he is at least at moderate risk for complications without adequate follow up.  This format is felt to be most appropriate for this patient at this time.  The patient did not have access to video technology/had technical difficulties with video requiring transitioning to audio format only (telephone).  All issues noted in this document were discussed and addressed.  No physical exam could be performed with this format.  Please refer to the patient's chart for his consent to telehealth for Brandon Soto.  Evaluation Performed:  Preoperative cardiovascular risk assessment _____________   Date:  10/08/2022   Patient ID:  Brandon Soto, DOB [birth year], MRN [id number] Patient Location:  Home Provider location:   Office  Primary Care Provider:  Michael Boston, MD Primary Cardiologist:  Brandon Burow, MD  Chief Complaint / Patient Profile   77 y.o. y/o male with a h/o atrial fibrillation, CHB s/p PPM, CAD s/p remote PCI-LAD, hypertension, hyperlipidemia, CVA, hypothyroidism, and OSA who is pending MRI fusion biopsy with [facility] and presents today for telephonic preoperative cardiovascular risk assessment.  History of Present Illness    Brandon Soto is a 77 y.o. male who presents via audio/video conferencing for a telehealth visit today.  Pt was last seen in cardiology clinic on 08/04/2022 by Dr. Gwenlyn Soto. At that time Brandon Soto was doing well.  The patient is now pending procedure as outlined above. Since his last visit, he has done well from a cardiac standpoint.   He denies chest pain, palpitations, dyspnea, pnd, orthopnea, n, v, dizziness, syncope, edema, weight gain, or early satiety. All other systems reviewed and are otherwise negative except as noted above.   Past Medical History    Past Medical History:  Diagnosis Date   BPH (benign prostatic hyperplasia)    Cataract     bilateral   Coronary artery disease    s/p LAD intervention 2001   CVA (cerebral infarction)    Dyslipidemia    GERD (gastroesophageal reflux disease)    Hypertension    Hypothyroidism    "shrank w/radiation ?1990's" (06/27/2012)   Left knee DJD    Myocardial infarction (HCC)    OSA (obstructive sleep apnea) 11/29/2014   Osteoarthritis    PONV (postoperative nausea and vomiting)    Sleep apnea    cpap   Small intestinal bacterial overgrowth 07/05/2018   Stroke [facility])    Past Surgical History:  Procedure Laterality Date   angioplasty N/A    CARDIAC CATHETERIZATION  ~ 2009   CHOLECYSTECTOMY  ~ 2006   COLONOSCOPY  06/2014   CORONARY ANGIOPLASTY     CORONARY ANGIOPLASTY WITH STENT PLACEMENT  2001   "1" (06/27/2012)   LOOP RECORDER IMPLANT  12-15-13   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   LOOP RECORDER IMPLANT N/A 12/15/2013   Procedure: LOOP RECORDER IMPLANT;  Surgeon: Brandon Mark, MD;  Location: [facility] CATH LAB;  Service: Cardiovascular;  Laterality: N/A;   LOOP RECORDER REMOVAL N/A 03/07/2021   Procedure: LOOP RECORDER REMOVAL;  Surgeon: Brandon Epley, MD;  Location: [facility] CV [facility];  Service: Cardiovascular;  Laterality: N/A;   MM CORP SCREENING MAMMO BIL/CAD ([place] HX)     PACEMAKER IMPLANT N/A 03/07/2021   Procedure: PACEMAKER IMPLANT;  Surgeon: Brandon Epley, MD;  Location: [facility];  Service: Cardiovascular;  Laterality: N/A;  PARTIAL KNEE ARTHROPLASTY  06/27/2012   Procedure: UNICOMPARTMENTAL KNEE;  Surgeon: Brandon Junes, MD;  Location: [facility];  Service: Orthopedics;  Laterality: Left;  left unicompartmental knee   SHOULDER ARTHROSCOPY WITH ROTATOR CUFF REPAIR AND SUBACROMIAL DECOMPRESSION Left 2005   (06/27/2012)   stent placement N/A    TEE WITHOUT CARDIOVERSION N/A 01/03/2014   Procedure: TRANSESOPHAGEAL ECHOCARDIOGRAM (TEE);  Surgeon: Brandon Klein, MD;  Location[facility];  Service: Cardiovascular;  Laterality: N/A;     Allergies  No Known Allergies  Home Medications    Prior to Admission medications   Medication Sig Start Date End Date Taking? Authorizing Provider  acetaminophen (TYLENOL) 500 MG tablet Take 1,000 mg by mouth every 6 (six) hours as needed for mild pain (For pain in hands).    [provider]  amLODipine (NORVASC) 5 MG tablet Take 1 tablet (5 mg total) by mouth daily. 08/04/22   Brandon Harp, MD  chlorthalidone (HYGROTON) 25 MG tablet Take 1 tablet (25 mg total) by mouth daily. 08/22/19   Brandon Harp, MD  Cyanocobalamin (B-12) 5000 MCG CAPS Take 5,000 mcg by mouth daily. 02/01/20   [provider]  esomeprazole (NEXIUM) 40 MG capsule Take 40 mg by mouth daily at 6 (six) AM.    [provider]  JARDIANCE 10 MG TABS tablet Take 10 mg by mouth daily. 02/27/21   [provider]  levothyroxine (SYNTHROID, LEVOTHROID) 175 MCG tablet Take 175 mcg by mouth daily before breakfast.    [provider]  losartan (COZAAR) 100 MG tablet Take 1 tablet (100 mg total) by mouth daily. 10/07/18   Brandon Harp, MD  Omega-3 Fatty Acids ([employer] FISH OIL) 1000 MG CAPS Take 1,000 mg by mouth daily. 02/01/20   [provider]  pregabalin (LYRICA) 150 MG capsule Take 150 mg by mouth 2 (two) times daily. 09/09/22   [provider]  rivaroxaban (XARELTO) 20 MG TABS tablet TAKE 1 TABLET BY MOUTH  DAILY WITH SUPPER 07/28/22   Brandon Harp, MD  rosuvastatin (CRESTOR) 20 MG tablet TAKE 1 TABLET BY MOUTH AT  BEDTIME 07/13/19   Brandon Harp, MD  tamsulosin (FLOMAX) 0.4 MG CAPS capsule Take 0.4 mg by mouth in the morning and at bedtime. 09/23/20   [provider]    Physical Exam    Vital Signs:  Brandon Soto does not have vital signs available for review today.  Given telephonic nature of communication, physical exam is limited. AAOx3. NAD. Normal affect.  Speech and respirations are unlabored.  Accessory Clinical Findings     None  Assessment & Plan    1.  Preoperative Cardiovascular Risk Assessment:  According to the Revised Cardiac Risk Index (RCRI), his Perioperative Risk of Major Cardiac Event is (%): 6.6. His Functional Capacity in METs is: 7.34 according to the Duke Activity Status Index (DASI). Therefore, based on ACC/AHA guidelines, patient would be at acceptable risk for the planned procedure without further cardiovascular testing.   The patient was advised that if he develops new symptoms prior to surgery to contact our office to arrange for a follow-up visit, and he verbalized understanding.  Per office protocol, patient can hold Xarelto for 3 days prior to procedure. Cleared for spinal procedures in May 2023 and March 2023 by Dr Brandon Soto. Given hx of prior stroke he should resume as soon as safely possible after the procedure    A copy of this note will be routed to requesting surgeon.  Time:   Today, I have spent 5 minutes with the patient with telehealth technology discussing medical history, symptoms, and management plan.     Lenna Sciara, NP  10/08/2022, 2:12 PM

## 2022-10-08 NOTE — Telephone Encounter (Signed)
Pre op APP Diona Browner, NP faxed clearance today about 2 pm. We will re-fax again to fax # [phone].

## 2022-10-08 NOTE — Telephone Encounter (Signed)
Calling for update. Please advise

## 2022-10-09 NOTE — Progress Notes (Signed)
Remote pacemaker transmission.   

## 2022-10-21 ENCOUNTER — Other Ambulatory Visit (HOSPITAL_COMMUNITY): Payer: Self-pay | Admitting: Urology

## 2022-10-21 DIAGNOSIS — R599 Enlarged lymph nodes, unspecified: Secondary | ICD-10-CM

## 2022-10-21 DIAGNOSIS — R9349 Abnormal radiologic findings on diagnostic imaging of other urinary organs: Secondary | ICD-10-CM

## 2022-11-09 ENCOUNTER — Other Ambulatory Visit (HOSPITAL_COMMUNITY): Payer: Self-pay | Admitting: Urology

## 2022-11-09 ENCOUNTER — Encounter (HOSPITAL_COMMUNITY)
Admission: RE | Admit: 2022-11-09 | Discharge: 2022-11-09 | Disposition: A | Discharge status: Home / Self Care | Payer: Medicare Other | Source: Ambulatory Visit | Attending: Urology | Admitting: Urology

## 2022-11-09 ENCOUNTER — Encounter (HOSPITAL_COMMUNITY): Payer: Medicare Other

## 2022-11-09 DIAGNOSIS — R599 Enlarged lymph nodes, unspecified: Secondary | ICD-10-CM

## 2022-11-09 DIAGNOSIS — R9349 Abnormal radiologic findings on diagnostic imaging of other urinary organs: Secondary | ICD-10-CM | POA: Diagnosis present

## 2022-11-09 LAB — GLUCOSE, CAPILLARY: Glucose-Capillary: 124 mg/dL — ABNORMAL HIGH (ref 70–99)

## 2022-11-09 MED ORDER — FLUDEOXYGLUCOSE F - 18 (FDG) INJECTION
11.7000 | Freq: Once | INTRAVENOUS | Status: AC
  Administered 2022-11-09: 11.7 via INTRAVENOUS

## 2022-11-17 ENCOUNTER — Other Ambulatory Visit (HOSPITAL_COMMUNITY): Payer: Self-pay | Admitting: Urology

## 2022-11-17 DIAGNOSIS — R590 Localized enlarged lymph nodes: Secondary | ICD-10-CM

## 2022-11-17 NOTE — Progress Notes (Signed)
Simonne Come, MD  Leodis Rains D OK for US guided L supraclavicular LN Bx.  PET CT - 11/09/22 - image 50, series 604  Sedation per pt request.  Nedra Hai

## 2022-11-18 ENCOUNTER — Telehealth: Payer: Self-pay | Admitting: *Deleted

## 2022-11-18 NOTE — Telephone Encounter (Signed)
Patient with diagnosis of atrial fibrillation on Xarelto for anticoagulation.    Procedure:   [place] CORE BIOPSY   Date of Surgery:  Clearance 12/02/22      CHA2DS2-VASc Score = 4   This indicates a 4.8% annual risk of stroke. The patient's score is based upon: CHF History: 0 HTN History: 1 Diabetes History: 0 Stroke History: 0 Vascular Disease History: 1 Age Score: 2 Gender Score: 0    CrCl 101 Platelet count 165  Per office protocol, patient can hold Xarelto for 2 days prior to procedure.   Patient will not need bridging with Lovenox (enoxaparin) around procedure.  **This guidance is not considered finalized until pre-operative APP has relayed final recommendations.**

## 2022-11-18 NOTE — Telephone Encounter (Signed)
   Pre-operative Risk Assessment    Patient Name: Brandon Soto  DOB: 12-Jan-1946 MRN: [id number]      Request for Surgical Clearance    Procedure:   [place] CORE BIOPSY  Date of Surgery:  Clearance 12/02/22                                 Surgeon:   Surgeon's Group or Practice Name:  [facility] Phone number:   Fax number:  [phone] ATTN:  TONYA   Type of Clearance Requested:   - Pharmacy:  Hold Rivaroxaban (Xarelto) NOT INDICATED HOW LONG   Type of Anesthesia:   MODERATE SEDATION   Additional requests/questions:    Wilhemina Cash   11/18/2022, 3:33 PM

## 2022-11-18 NOTE — Telephone Encounter (Signed)
   Patient Name: Brandon Soto  DOB: November 01, 1945 MRN: [id number]  Primary Cardiologist: Nanetta Batty, MD  Clinical pharmacists have reviewed the patient's past medical history, labs, and current medications as part of preoperative protocol coverage. The following recommendations have been made:  Patient with diagnosis of atrial fibrillation on Xarelto for anticoagulation.     Procedure:   [place] CORE BIOPSY   Date of Surgery:  Clearance 12/02/22        CHA2DS2-VASc Score = 4  This indicates a 4.8% annual risk of stroke. The patient's score is based upon: CHF History: 0 HTN History: 1 Diabetes History: 0 Stroke History: 0 Vascular Disease History: 1 Age Score: 2 Gender Score: 0    CrCl 101 Platelet count 165   Per office protocol, patient can hold Xarelto for 2 days prior to procedure.   Patient will not need bridging with Lovenox (enoxaparin) around procedure. Please resume Xarelto as soon as possible postprocedure, at the discretion of the surgeon.    I will route this recommendation to the requesting party via Epic fax function and remove from pre-op pool.  Please call with questions.  Joylene Grapes, NP 11/18/2022, 4:50 PM

## 2022-11-26 ENCOUNTER — Other Ambulatory Visit (HOSPITAL_COMMUNITY): Payer: Medicare Other

## 2022-12-01 ENCOUNTER — Other Ambulatory Visit: Payer: Self-pay | Admitting: Radiology

## 2022-12-01 ENCOUNTER — Other Ambulatory Visit: Payer: Self-pay | Admitting: Student

## 2022-12-01 DIAGNOSIS — E7800 Pure hypercholesterolemia, unspecified: Secondary | ICD-10-CM

## 2022-12-02 ENCOUNTER — Other Ambulatory Visit: Payer: Self-pay

## 2022-12-02 ENCOUNTER — Ambulatory Visit (HOSPITAL_COMMUNITY)
Admission: RE | Admit: 2022-12-02 | Discharge: 2022-12-02 | Disposition: A | Discharge status: Home / Self Care | Payer: Medicare Other | Source: Ambulatory Visit | Attending: Urology | Admitting: Urology

## 2022-12-02 ENCOUNTER — Encounter (HOSPITAL_COMMUNITY): Payer: Self-pay

## 2022-12-02 DIAGNOSIS — I2510 Atherosclerotic heart disease of native coronary artery without angina pectoris: Secondary | ICD-10-CM | POA: Diagnosis not present

## 2022-12-02 DIAGNOSIS — Z955 Presence of coronary angioplasty implant and graft: Secondary | ICD-10-CM | POA: Insufficient documentation

## 2022-12-02 DIAGNOSIS — I10 Essential (primary) hypertension: Secondary | ICD-10-CM | POA: Diagnosis not present

## 2022-12-02 DIAGNOSIS — G4733 Obstructive sleep apnea (adult) (pediatric): Secondary | ICD-10-CM | POA: Diagnosis not present

## 2022-12-02 DIAGNOSIS — E785 Hyperlipidemia, unspecified: Secondary | ICD-10-CM | POA: Diagnosis not present

## 2022-12-02 DIAGNOSIS — E039 Hypothyroidism, unspecified: Secondary | ICD-10-CM | POA: Diagnosis not present

## 2022-12-02 DIAGNOSIS — C61 Malignant neoplasm of prostate: Secondary | ICD-10-CM | POA: Insufficient documentation

## 2022-12-02 DIAGNOSIS — K219 Gastro-esophageal reflux disease without esophagitis: Secondary | ICD-10-CM | POA: Insufficient documentation

## 2022-12-02 DIAGNOSIS — R590 Localized enlarged lymph nodes: Secondary | ICD-10-CM | POA: Diagnosis present

## 2022-12-02 DIAGNOSIS — E7800 Pure hypercholesterolemia, unspecified: Secondary | ICD-10-CM

## 2022-12-02 DIAGNOSIS — N400 Enlarged prostate without lower urinary tract symptoms: Secondary | ICD-10-CM | POA: Diagnosis not present

## 2022-12-02 DIAGNOSIS — C9110 Chronic lymphocytic leukemia of B-cell type not having achieved remission: Secondary | ICD-10-CM | POA: Diagnosis not present

## 2022-12-02 DIAGNOSIS — Z8673 Personal history of transient ischemic attack (TIA), and cerebral infarction without residual deficits: Secondary | ICD-10-CM | POA: Diagnosis not present

## 2022-12-02 DIAGNOSIS — I252 Old myocardial infarction: Secondary | ICD-10-CM | POA: Diagnosis not present

## 2022-12-02 LAB — GLUCOSE, CAPILLARY: Glucose-Capillary: 133 mg/dL — ABNORMAL HIGH (ref 70–99)

## 2022-12-02 MED ORDER — SODIUM CHLORIDE 0.9 % IV SOLN: INTRAVENOUS | Status: DC

## 2022-12-02 MED ORDER — LIDOCAINE HCL (PF) 1 % IJ SOLN
10.0000 mL | Freq: Once | INTRAMUSCULAR | Status: AC
  Administered 2022-12-02: 10 mL via INTRADERMAL

## 2022-12-02 NOTE — Procedures (Signed)
Interventional Radiology Procedure Note  Procedure: US Guided Biopsy of left supraclavicular LN  Complications: None  Estimated Blood Loss: < 10 mL  Findings: 18 G core biopsy of 4 cm left supraclavicular LN performed under US guidance.  6 core samples obtained and sent to Pathology.  Jodi Marble. Fredia Sorrow, M.D Pager:  [phone]

## 2022-12-02 NOTE — H&P (Signed)
Chief Complaint: Patient was seen in consultation today for left supraclavicular lymph node biosy at the request of Wrenn,John  Referring Physician(s): Bjorn Pippin  Supervising Physician: Irish Lack  Patient Status: [facility] - Out-pt  History of Present Illness: Brandon Soto is a 77 y.o. male   New Dx prostate Cancer Elevated PSA Follows with Dr Annabell Howells  PET 4/15:  IMPRESSION: 1. Diffuse lymphadenopathy in the neck, chest, abdomen and pelvis with mild hypermetabolism. Most of this disease is Deauville 3. Findings most likely due to a low grade lymphoma such as CLL. Biopsy of the left supraclavicular nodal group or inguinal lymph node is suggested. 2. No worrisome pulmonary nodules. 3. No bone lesions are identified  Approved for Left supraclavicular LN biopsy   Past Medical History:  Diagnosis Date   BPH (benign prostatic hyperplasia)    Cataract    bilateral   Coronary artery disease    s/p LAD intervention 2001   CVA (cerebral infarction)    Dyslipidemia    GERD (gastroesophageal reflux disease)    Hypertension    Hypothyroidism    "shrank w/radiation ?1990's" (06/27/2012)   Left knee DJD    Myocardial infarction (HCC)    OSA (obstructive sleep apnea) 11/29/2014   Osteoarthritis    PONV (postoperative nausea and vomiting)    Sleep apnea    cpap   Small intestinal bacterial overgrowth 07/05/2018   Stroke [facility])     Past Surgical History:  Procedure Laterality Date   angioplasty N/A    CARDIAC CATHETERIZATION  ~ 2009   CHOLECYSTECTOMY  ~ 2006   COLONOSCOPY  06/2014   CORONARY ANGIOPLASTY     CORONARY ANGIOPLASTY WITH STENT PLACEMENT  2001   "1" (06/27/2012)   LOOP RECORDER IMPLANT  12-15-13   MDT LinQ implanted by Dr Johney Frame for cryptogenic stroke   LOOP RECORDER IMPLANT N/A 12/15/2013   Procedure: LOOP RECORDER IMPLANT;  Surgeon: Gardiner Rhyme, MD;  Location: [facility] CATH LAB;  Service: Cardiovascular;  Laterality: N/A;   LOOP RECORDER REMOVAL N/A 03/07/2021    Procedure: LOOP RECORDER REMOVAL;  Surgeon: Lanier Prude, MD;  Location: [facility] INVASIVE CV LAB;  Service: Cardiovascular;  Laterality: N/A;   MM [employer]P SCREENING MAMMO BIL/CAD ([facility] HX)     PACEMAKER IMPLANT N/A 03/07/2021   Procedure: PACEMAKER IMPLANT;  Surgeon: Lanier Prude, MD;  Location: [facility] INVASIVE CV LAB;  Service: Cardiovascular;  Laterality: N/A;   PARTIAL KNEE ARTHROPLASTY  06/27/2012   Procedure: UNICOMPARTMENTAL KNEE;  Surgeon: Nilda Simmer, MD;  Location: [facility] OR;  Service: Orthopedics;  Laterality: Left;  left unicompartmental knee   SHOULDER ARTHROSCOPY WITH ROTATOR CUFF REPAIR AND SUBACROMIAL DECOMPRESSION Left 2005   (06/27/2012)   stent placement N/A    TEE WITHOUT CARDIOVERSION N/A 01/03/2014   Procedure: TRANSESOPHAGEAL ECHOCARDIOGRAM (TEE);  Surgeon: Thurmon Fair, MD;  Location: [facility] ENDOSCOPY;  Service: Cardiovascular;  Laterality: N/A;    Allergies: Patient has no known allergies.  Medications: Prior to Admission medications   Medication Sig Start Date End Date Taking? Authorizing Provider  acetaminophen (TYLENOL) 500 MG tablet Take 1,000 mg by mouth every 6 (six) hours as needed for mild pain (For pain in hands).   Yes [provider]  amLODipine (NORVASC) 5 MG tablet Take 1 tablet (5 mg total) by mouth daily. 08/04/22  Yes Runell Gess, MD  chlorthalidone (HYGROTON) 25 MG tablet Take 1 tablet (25 mg total) by mouth daily. 08/22/19  Yes Runell Gess, MD  Cyanocobalamin (  B-12) 5000 MCG CAPS Take 5,000 mcg by mouth daily. 02/01/20  Yes [provider]  esomeprazole (NEXIUM) 40 MG capsule Take 40 mg by mouth daily at 6 (six) AM.   Yes [provider]  JARDIANCE 10 MG TABS tablet Take 10 mg by mouth daily. 02/27/21  Yes [provider]  levothyroxine (SYNTHROID, LEVOTHROID) 175 MCG tablet Take 175 mcg by mouth daily before breakfast.   Yes [provider]  losartan (COZAAR) 100 MG tablet Take 1 tablet (100 mg total)  by mouth daily. 10/07/18  Yes Runell Gess, MD  pregabalin (LYRICA) 150 MG capsule Take 150 mg by mouth 2 (two) times daily. 09/09/22  Yes [provider]  rosuvastatin (CRESTOR) 20 MG tablet TAKE 1 TABLET BY MOUTH AT  BEDTIME 07/13/19  Yes Runell Gess, MD  tamsulosin (FLOMAX) 0.4 MG CAPS capsule Take 0.4 mg by mouth in the morning and at bedtime. 09/23/20  Yes [provider]  Omega-3 Fatty Acids ([employer] FISH OIL) 1000 MG CAPS Take 1,000 mg by mouth daily. 02/01/20   [provider]  rivaroxaban (XARELTO) 20 MG TABS tablet TAKE 1 TABLET BY MOUTH  DAILY WITH SUPPER 07/28/22   Runell Gess, MD     Family History  Problem Relation Age of Onset   Heart attack Mother    Heart disease Brother    Liver cancer Brother        deceased   Cancer - Lung Brother        deceased   Esophageal cancer Brother     Social History   Socioeconomic History   Marital status: Married    Spouse name: Not on file   Number of children: 2   Years of education: Not on file   Highest education level: Not on file  Occupational History   Occupation: retired  Tobacco Use   Smoking status: Never   Smokeless tobacco: Never  Vaping Use   Vaping Use: Never used  Substance and Sexual Activity   Alcohol use: No    Alcohol/week: 0.0 standard drinks of alcohol   Drug use: No   Sexual activity: Yes  Other Topics Concern   Not on file  Social History Narrative   Married and retired, 2 children   Stays busy keeping up his [place] home plus he has a home in [place] and takes care of his mother-in-law's home   No alcohol tobacco or drug use   Social Determinants of Corporate investment banker Strain: Not on file  Food Insecurity: Not on file  Transportation Needs: Not on file  Physical Activity: Not on file  Stress: Not on file  Social Connections: Not on file    Review of Systems: A 12 point ROS discussed and pertinent positives are indicated in the HPI  above.  All other systems are negative.  Review of Systems  Constitutional:  Negative for activity change, fatigue and fever.  Respiratory:  Negative for cough and shortness of breath.   Cardiovascular:  Negative for chest pain.  Gastrointestinal:  Negative for abdominal pain, nausea and vomiting.  Psychiatric/Behavioral:  Negative for behavioral problems and confusion.     Vital Signs: BP 128/69   Pulse (!) 54   Temp 97.6 F (36.4 C) (Temporal)   Resp 18   Ht 6\' 1"  (1.854 m)   Wt 235 lb (106.6 kg)   SpO2 96%   BMI 31.00 kg/m    Physical Exam Vitals reviewed.  HENT:  Mouth/Throat:     Mouth: Mucous membranes are moist.  Cardiovascular:     Rate and Rhythm: Normal rate and regular rhythm.     Heart sounds: Normal heart sounds.  Pulmonary:     Effort: Pulmonary effort is normal.     Breath sounds: Normal breath sounds.  Abdominal:     Palpations: Abdomen is soft.  Musculoskeletal:        General: Normal range of motion.  Skin:    General: Skin is warm.  Neurological:     Mental Status: He is alert and oriented to person, place, and time.  Psychiatric:        Behavior: Behavior normal.     Imaging: NM PET Image Initial (PI) Skull Base To Thigh (F-18 FDG)  Result Date: 11/12/2022 CLINICAL DATA:  Initial treatment strategy for lymphadenopathy. History of prostate cancer. EXAM: NUCLEAR MEDICINE PET SKULL BASE TO THIGH TECHNIQUE: 11.7 mCi F-18 FDG was injected intravenously. Full-ring PET imaging was performed from the skull base to thigh after the radiotracer. CT data was obtained and used for attenuation correction and anatomic localization. Fasting blood glucose: 124 mg/dl COMPARISON:  MRI pelvis 09/25/2022 FINDINGS: Mediastinal blood pool activity: SUV max 3.15 Liver activity: SUV max 4.03 NECK: Bilateral multi station lymphadenopathy with innumerable borderline enlarged lymph nodes throughout the neck. Right submental node on image 44/4 measures 12 mm and SUV max is  2.54. Multiple enlarged left supraclavicular nodes measuring up to 2 cm on image number 50/4 with SUV max of 2.68. Incidental CT findings: None. CHEST: Bilateral axillary and extensive mediastinal and hilar lymphadenopathy. 12 mm left axillary node on image 74/4 has an SUV max of 2.70. 12 mm AP window node has an SUV max of 2.58. 2 cm subcarinal node on image 83/4 has an SUV max of 3.02. No worrisome pulmonary nodules. Incidental CT findings: Borderline cardiac size. There are pacer wires in good position. Aortic and coronary artery calcifications are noted. ABDOMEN/PELVIS: Extensive mesenteric and retroperitoneal lymphadenopathy. 15 mm gastrohepatic ligament node on image 160/4 has an SUV max of 3.89. Left retroperitoneal node on image number 127/4 measures 14 mm and SUV max is 2.79. Interaortocaval node measures 13 mm on image 135/4 and has an SUV max of 3.45. 2 cm left common iliac node on image 156/4 has an SUV max of 2.62. Left pelvic sidewall node measures 13 mm on image 185/4 and has an SUV max of 2.96. 13 mm right inguinal on image number 192/4 has an SUV max of 2.60. Incidental CT findings: Status post cholecystectomy. No biliary dilatation. Scattered aortic and branch vessel calcifications. SKELETON: No hypermetabolic bone lesions are identified. Incidental CT findings: None. IMPRESSION: 1. Diffuse lymphadenopathy in the neck, chest, abdomen and pelvis with mild hypermetabolism. Most of this disease is Deauville 3. Findings most likely due to a low grade lymphoma such as CLL. Biopsy of the left supraclavicular nodal group or inguinal lymph node is suggested. 2. No worrisome pulmonary nodules. 3. No bone lesions are identified. Aortic Atherosclerosis (ICD10-I70.0). Electronically Signed   By: Rudie Meyer M.D.   On: 11/12/2022 08:22    Labs:  CBC: Recent Labs    08/04/22 1136  WBC 9.8  HGB 16.2  HCT 48.3  PLT 165    COAGS: No results for input(s): "INR", "APTT" in the last 8760  hours.  BMP: Recent Labs    08/04/22 1136  NA 136  K 3.8  CL 94*  CO2 25  GLUCOSE 138*  BUN 20  CALCIUM 10.0  CREATININE 0.92    LIVER FUNCTION TESTS: No results for input(s): "BILITOT", "AST", "ALT", "ALKPHOS", "PROT", "ALBUMIN" in the last 8760 hours.  TUMOR MARKERS: No results for input(s): "AFPTM", "CEA", "CA199", "CHROMGRNA" in the last 8760 hours.  Assessment and Plan:  Scheduled for Left supraclavicular lymph node biopsy Risks and benefits of Left supraclavicular lymph node biopsy was discussed with the patient and/or patient's family including, but not limited to bleeding, infection, damage to adjacent structures or low yield requiring additional tests.  All of the questions were answered and there is agreement to proceed.  Consent signed and in chart.  Thank you for this interesting consult.  I greatly enjoyed meeting Brandon Soto and look forward to participating in their care.  A copy of this report was sent to the requesting provider on this date.  Electronically Signed: Robet Leu, PA-C 12/02/2022, 11:41 AM   I spent a total of  30 Minutes   in face to face in clinical consultation, greater than 50% of which was counseling/coordinating care for L SCLN bx

## 2022-12-04 LAB — SURGICAL PATHOLOGY

## 2022-12-08 ENCOUNTER — Ambulatory Visit (INDEPENDENT_AMBULATORY_CARE_PROVIDER_SITE_OTHER): Payer: Medicare Other

## 2022-12-08 DIAGNOSIS — I441 Atrioventricular block, second degree: Secondary | ICD-10-CM

## 2022-12-10 ENCOUNTER — Telehealth: Payer: Self-pay | Admitting: Cardiology

## 2022-12-10 LAB — CUP PACEART REMOTE DEVICE CHECK
Battery Remaining Longevity: 139 mo
Battery Voltage: 3.02 V
Brady Statistic AP VP Percent: 5.15 %
Brady Statistic AP VS Percent: 0 %
Brady Statistic AS VP Percent: 93.6 %
Brady Statistic AS VS Percent: 1.25 %
Brady Statistic RA Percent Paced: 5.79 %
Brady Statistic RV Percent Paced: 98.75 %
Date Time Interrogation Session: 20240515140827
Implantable Lead Connection Status: 753985
Implantable Lead Connection Status: 753985
Implantable Lead Implant Date: 20220812
Implantable Lead Implant Date: 20220812
Implantable Lead Location: 753859
Implantable Lead Location: 753860
Implantable Lead Model: 3830
Implantable Lead Model: 5076
Implantable Pulse Generator Implant Date: 20220812
Lead Channel Impedance Value: 304 Ohm
Lead Channel Impedance Value: 361 Ohm
Lead Channel Impedance Value: 494 Ohm
Lead Channel Impedance Value: 646 Ohm
Lead Channel Pacing Threshold Amplitude: 1 V
Lead Channel Pacing Threshold Amplitude: 1.375 V
Lead Channel Pacing Threshold Pulse Width: 0.4 ms
Lead Channel Pacing Threshold Pulse Width: 0.4 ms
Lead Channel Sensing Intrinsic Amplitude: 1.5 mV
Lead Channel Sensing Intrinsic Amplitude: 1.5 mV
Lead Channel Sensing Intrinsic Amplitude: 12.125 mV
Lead Channel Sensing Intrinsic Amplitude: 12.125 mV
Lead Channel Setting Pacing Amplitude: 2 V
Lead Channel Setting Pacing Amplitude: 3.25 V
Lead Channel Setting Pacing Pulse Width: 0.4 ms
Lead Channel Setting Sensing Sensitivity: 1.2 mV
Zone Setting Status: 755011

## 2022-12-10 NOTE — Telephone Encounter (Signed)
Patient is calling because they have been without wifi for 9 days. Patient is requesting that we try to get the transmission again for his device. Patient stated if we still have any issues, feel free to give him a call.

## 2022-12-11 NOTE — Telephone Encounter (Signed)
I let the patient know that we got his transmission on 12/09/2022.

## 2022-12-15 ENCOUNTER — Inpatient Hospital Stay: Payer: Medicare Other | Attending: Hematology and Oncology | Admitting: Hematology and Oncology

## 2022-12-15 ENCOUNTER — Inpatient Hospital Stay: Payer: Medicare Other

## 2022-12-15 ENCOUNTER — Other Ambulatory Visit: Payer: Self-pay

## 2022-12-15 VITALS — BP 116/73 | HR 62 | Temp 97.8°F | Resp 13 | Wt 241.3 lb

## 2022-12-15 DIAGNOSIS — C9110 Chronic lymphocytic leukemia of B-cell type not having achieved remission: Secondary | ICD-10-CM | POA: Diagnosis not present

## 2022-12-15 DIAGNOSIS — G4733 Obstructive sleep apnea (adult) (pediatric): Secondary | ICD-10-CM | POA: Insufficient documentation

## 2022-12-15 DIAGNOSIS — R590 Localized enlarged lymph nodes: Secondary | ICD-10-CM | POA: Diagnosis not present

## 2022-12-15 DIAGNOSIS — Z800 Family history of malignant neoplasm of digestive organs: Secondary | ICD-10-CM | POA: Insufficient documentation

## 2022-12-15 DIAGNOSIS — Z801 Family history of malignant neoplasm of trachea, bronchus and lung: Secondary | ICD-10-CM | POA: Diagnosis not present

## 2022-12-15 LAB — CMP (CANCER CENTER ONLY)
ALT: 19 U/L (ref 0–44)
AST: 19 U/L (ref 15–41)
Albumin: 4.5 g/dL (ref 3.5–5.0)
Alkaline Phosphatase: 50 U/L (ref 38–126)
Anion gap: 7 (ref 5–15)
BUN: 18 mg/dL (ref 8–23)
CO2: 29 mmol/L (ref 22–32)
Calcium: 9.5 mg/dL (ref 8.9–10.3)
Chloride: 100 mmol/L (ref 98–111)
Creatinine: 1.12 mg/dL (ref 0.61–1.24)
GFR, Estimated: >60 mL/min (ref >60)
Glucose, Bld: 150 mg/dL — ABNORMAL HIGH (ref 70–99)
Potassium: 3.5 mmol/L (ref 3.5–5.1)
Sodium: 136 mmol/L (ref 135–145)
Total Bilirubin: 1.3 mg/dL — ABNORMAL HIGH (ref 0.3–1.2)
Total Protein: 6.7 g/dL (ref 6.5–8.1)

## 2022-12-15 LAB — LACTATE DEHYDROGENASE: LDH: 181 U/L (ref 98–192)

## 2022-12-15 LAB — CBC WITH DIFFERENTIAL (CANCER CENTER ONLY)
Abs Immature Granulocytes: 0.04 10*3/uL (ref 0.00–0.07)
Basophils Absolute: 0 10*3/uL (ref 0.0–0.1)
Basophils Relative: 1 %
Eosinophils Absolute: 0.1 10*3/uL (ref 0.0–0.5)
Eosinophils Relative: 1 %
HCT: 43.4 % (ref 39.0–52.0)
Hemoglobin: 14.7 g/dL (ref 13.0–17.0)
Immature Granulocytes: 1 %
Lymphocytes Relative: 51 %
Lymphs Abs: 4 10*3/uL (ref 0.7–4.0)
MCH: 32.5 pg (ref 26.0–34.0)
MCHC: 33.9 g/dL (ref 30.0–36.0)
MCV: 96 fL (ref 80.0–100.0)
Monocytes Absolute: 0.8 10*3/uL (ref 0.1–1.0)
Monocytes Relative: 10 %
Neutro Abs: 2.8 10*3/uL (ref 1.7–7.7)
Neutrophils Relative %: 36 %
Platelet Count: 143 10*3/uL — ABNORMAL LOW (ref 150–400)
RBC: 4.52 MIL/uL (ref 4.22–5.81)
RDW: 13.2 % (ref 11.5–15.5)
WBC Count: 7.8 10*3/uL (ref 4.0–10.5)
nRBC: 0 % (ref 0.0–0.2)

## 2022-12-15 NOTE — Progress Notes (Signed)
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 Patient found to have  diffuse lymphadenopathy in the neck, chest, abdomen and pelvis with mild hypermetabolism. Most of this disease is Deauville 3. Findings most likely due to a low grade lymphoma such as CLL 12/02/2022: Ultrasound guided core biopsy performed findings consistent with SLL/CLL. 12/15/2022: establish care with Dr. Leonides Schanz   CHIEF COMPLAINTS/PURPOSE OF CONSULTATION:  "Small Lymphocytic Leukemia "  HISTORY OF PRESENTING ILLNESS:  Brandon Soto 77 y.o. male with medical history significant for coronary artery disease, CVA, hyperlipidemia, GERD, hypothyroidism, OSA, and hypertension who presents for evaluation of newly diagnosed CLL.  On review of the previous records Brandon Soto was undergoing evaluation for his prostate cancer by his primary urologist Dr. Annabell Howells.  As part of that workup he underwent nuclear medicine PET CT scan on 11/12/2022 which showed diffuse lymphadenopathy in the neck, chest, abdomen, pelvis consistent with low-grade lymphoma such as CLL.  An ultrasound-guided core biopsy was performed on 12/02/2022 which showed findings consistent with SLL/CLL.  Due to concern for these findings the patient was referred to hematology/oncology for further evaluation and management.  On exam today Brandon Soto is accompanied by his wife.  He reports that he does have some occasional bouts of pain in his lower abdomen that the source of which is unclear.  He also does have palpable lymphadenopathy in  his submandibular region as well as in his neck.  He reports he does try to eat a healthy regular diet including fruits and vegetables.  He notes he is not having any B symptoms such as fevers, chills, sweats, nausea, vomiting or diarrhea.  On further discussion he reports his family history is remarkable for heart disease and a CPAP machine in his mother.  He does not know his father.  He has extensive oncological history and his brothers, 1 had esophageal, throat, liver, kidney, and another died of "heart disease".  He notes he has 2 children both of whom are healthy.  He is a never smoker and drinks alcohol approximately once or twice per year.  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  He notes his energy levels have been running low but he has not been having any focal symptoms.  A full 10 point ROS is otherwise negative.  MEDICAL HISTORY:  Past Medical History:  Diagnosis Date   BPH (benign prostatic hyperplasia)    Cataract    bilateral   Coronary artery disease    s/p LAD intervention 2001   CVA (cerebral infarction)    Dyslipidemia    GERD (gastroesophageal reflux disease)    Hypertension    Hypothyroidism    "shrank w/radiation ?1990's" (06/27/2012)   Left knee DJD    Myocardial infarction (HCC)    OSA (obstructive sleep apnea) 11/29/2014   Osteoarthritis    PONV (postoperative nausea and vomiting)    Sleep apnea    cpap   Small intestinal bacterial overgrowth 07/05/2018   Stroke [facility])     SURGICAL HISTORY: Past Surgical History:  Procedure Laterality Date   angioplasty N/A    CARDIAC  CATHETERIZATION  ~ 2009   CHOLECYSTECTOMY  ~ 2006   COLONOSCOPY  06/2014   CORONARY ANGIOPLASTY     CORONARY ANGIOPLASTY WITH STENT PLACEMENT  2001   "1" (06/27/2012)   LOOP RECORDER IMPLANT  12-15-13   MDT LinQ implanted by Dr Johney Frame for cryptogenic stroke   LOOP RECORDER IMPLANT N/A 12/15/2013   Procedure: LOOP RECORDER IMPLANT;  Surgeon: Gardiner Rhyme, MD;   Location: [facility] CATH LAB;  Service: Cardiovascular;  Laterality: N/A;   LOOP RECORDER REMOVAL N/A 03/07/2021   Procedure: LOOP RECORDER REMOVAL;  Surgeon: Lanier Prude, MD;  Location: [facility] INVASIVE CV LAB;  Service: Cardiovascular;  Laterality: N/A;   MM CORP SCREENING MAMMO BIL/CAD ([facility] HX)     PACEMAKER IMPLANT N/A 03/07/2021   Procedure: PACEMAKER IMPLANT;  Surgeon: Lanier Prude, MD;  Location: [facility] INVASIVE CV LAB;  Service: Cardiovascular;  Laterality: N/A;   PARTIAL KNEE ARTHROPLASTY  06/27/2012   Procedure: UNICOMPARTMENTAL KNEE;  Surgeon: Nilda Simmer, MD;  Location: [facility] OR;  Service: Orthopedics;  Laterality: Left;  left unicompartmental knee   SHOULDER ARTHROSCOPY WITH ROTATOR CUFF REPAIR AND SUBACROMIAL DECOMPRESSION Left 2005   (06/27/2012)   stent placement N/A    TEE WITHOUT CARDIOVERSION N/A 01/03/2014   Procedure: TRANSESOPHAGEAL ECHOCARDIOGRAM (TEE);  Surgeon: Thurmon Fair, MD;  Location: [facility] ENDOSCOPY;  Service: Cardiovascular;  Laterality: N/A;    SOCIAL HISTORY: Social History   Socioeconomic History   Marital status: Married    Spouse name: Not on file   Number of children: 2   Years of education: Not on file   Highest education level: Not on file  Occupational History   Occupation: retired  Tobacco Use   Smoking status: Never   Smokeless tobacco: Never  Vaping Use   Vaping Use: Never used  Substance and Sexual Activity   Alcohol use: No    Alcohol/week: 0.0 standard drinks of alcohol   Drug use: No   Sexual activity: Yes  Other Topics Concern   Not on file  Social History Narrative   Married and retired, 2 children   Stays busy keeping up his [place] home plus he has a home in [place] and takes care of his mother-in-law's home   No alcohol tobacco or drug use   Social Determinants of Corporate investment banker Strain: Not on file  Food Insecurity: No Food Insecurity (12/15/2022)   Hunger Vital Sign    Worried About Running Out  of Food in the Last Year: Never true    Ran Out of Food in the Last Year: Never true  Transportation Needs: No Transportation Needs (12/15/2022)   PRAPARE - Administrator, Civil Service (Medical): No    Lack of Transportation (Non-Medical): No  Physical Activity: Not on file  Stress: Not on file  Social Connections: Not on file  Intimate Partner Violence: Patient Unable To Answer (12/15/2022)   Humiliation, Afraid, Rape, and Kick questionnaire    Fear of Current or Ex-Partner: Patient unable to answer    Emotionally Abused: Patient unable to answer    Physically Abused: Patient unable to answer    Sexually Abused: Patient unable to answer    FAMILY HISTORY: Family History  Problem Relation Age of Onset   Heart attack Mother    Heart disease Brother    Liver cancer Brother        deceased   Cancer - Lung Brother        deceased  Esophageal cancer Brother     ALLERGIES:  has No Known Allergies.  MEDICATIONS:  Current Outpatient Medications  Medication Sig Dispense Refill   acetaminophen (TYLENOL) 500 MG tablet Take 1,000 mg by mouth every 6 (six) hours as needed for mild pain (For pain in hands).     amLODipine (NORVASC) 5 MG tablet Take 1 tablet (5 mg total) by mouth daily. 90 tablet 3   chlorthalidone (HYGROTON) 25 MG tablet Take 1 tablet (25 mg total) by mouth daily. 90 tablet 3   Cyanocobalamin (B-12) 5000 MCG CAPS Take 5,000 mcg by mouth daily.     esomeprazole (NEXIUM) 40 MG capsule Take 40 mg by mouth daily at 6 (six) AM.     JARDIANCE 10 MG TABS tablet Take 10 mg by mouth daily.     levothyroxine (SYNTHROID, LEVOTHROID) 175 MCG tablet Take 175 mcg by mouth daily before breakfast.     losartan (COZAAR) 100 MG tablet Take 1 tablet (100 mg total) by mouth daily. 90 tablet 3   Omega-3 Fatty Acids ([employer] FISH OIL) 1000 MG CAPS Take 1,000 mg by mouth daily.     pregabalin (LYRICA) 150 MG capsule Take 150 mg by mouth 2 (two) times daily.     rivaroxaban (XARELTO)  20 MG TABS tablet TAKE 1 TABLET BY MOUTH  DAILY WITH SUPPER 90 tablet 1   rosuvastatin (CRESTOR) 20 MG tablet TAKE 1 TABLET BY MOUTH AT  BEDTIME 90 tablet 3   tamsulosin (FLOMAX) 0.4 MG CAPS capsule Take 0.4 mg by mouth in the morning and at bedtime.     No current facility-administered medications for this visit.    REVIEW OF SYSTEMS:   Constitutional: ( - ) fevers, ( - )  chills , ( - ) night sweats Eyes: ( - ) blurriness of vision, ( - ) double vision, ( - ) watery eyes Ears, nose, mouth, throat, and face: ( - ) mucositis, ( - ) sore throat Respiratory: ( - ) cough, ( - ) dyspnea, ( - ) wheezes Cardiovascular: ( - ) palpitation, ( - ) chest discomfort, ( - ) lower extremity swelling Gastrointestinal:  ( - ) nausea, ( - ) heartburn, ( - ) change in bowel habits Skin: ( - ) abnormal skin rashes Lymphatics: ( - ) new lymphadenopathy, ( - ) easy bruising Neurological: ( - ) numbness, ( - ) tingling, ( - ) new weaknesses Behavioral/Psych: ( - ) mood change, ( - ) new changes  All other systems were reviewed with the patient and are negative.  PHYSICAL EXAMINATION: ECOG PERFORMANCE STATUS: 0 - Asymptomatic  Vitals:   12/15/22 1258  BP: 116/73  Pulse: 62  Resp: 13  Temp: 97.8 F (36.6 C)  SpO2: 91%   Filed Weights   12/15/22 1258  Weight: 241 lb 4.8 oz (109.5 kg)    GENERAL: well appearing elderly Caucasian male in NAD  SKIN: skin color, texture, turgor are normal, no rashes or significant lesions EYES: conjunctiva are pink and non-injected, sclera clear OROPHARYNX: no exudate, no erythema; lips, buccal mucosa, and tongue normal  NECK: supple, non-tender LYMPH:   palpable lymphadenopathy in the cervical and submandibular nodes. No palpable axillary or supraclavicular lymph nodes. LUNGS: clear to auscultation and percussion with normal breathing effort HEART: regular rate & rhythm and no murmurs and no lower extremity edema ABDOMEN: soft, non-tender, non-distended, normal  bowel sounds Musculoskeletal: no cyanosis of digits and no clubbing  PSYCH: alert & oriented x 3, fluent speech  NEURO: no focal motor/sensory deficits  LABORATORY DATA:  I have reviewed the data as listed    Latest Ref Rng & Units 12/15/2022    1:49 PM 08/04/2022   11:36 AM 03/04/2021   10:29 AM  CBC  WBC 4.0 - 10.5 K/uL 7.8  9.8  7.7   Hemoglobin 13.0 - 17.0 g/dL 78.2  95.6  21.3   Hematocrit 39.0 - 52.0 % 43.4  48.3  46.8   Platelets 150 - 400 K/uL 143  165  175        Latest Ref Rng & Units 12/15/2022    1:49 PM 08/04/2022   11:36 AM 03/04/2021   10:29 AM  CMP  Glucose 70 - 99 mg/dL 086  578  469   BUN 8 - 23 mg/dL 18  20  13    Creatinine 0.61 - 1.24 mg/dL 6.29  5.28  4.13   Sodium 135 - 145 mmol/L 136  136  136   Potassium 3.5 - 5.1 mmol/L 3.5  3.8  3.9   Chloride 98 - 111 mmol/L 100  94  93   CO2 22 - 32 mmol/L 29  25  24    Calcium 8.9 - 10.3 mg/dL 9.5  24.4  01.0   Total Protein 6.5 - 8.1 g/dL 6.7     Total Bilirubin 0.3 - 1.2 mg/dL 1.3     Alkaline Phos 38 - 126 U/L 50     AST 15 - 41 U/L 19     ALT 0 - 44 U/L 19        PATHOLOGY:  SURGICAL PATHOLOGY CASE: [id number] PATIENT: Brandon Soto Surgical Pathology Report     Clinical History: diffuse lymphadenopathy (cm)     FINAL MICROSCOPIC DIAGNOSIS:  A. LYMPH NODE, LEFT SUPRACLAVICULAR, NEEDLE CORE BIOPSY: -  Findings most consistent with small lymphocytic lymphoma/chronic lymphocytic leukemia  Note: The lymph node cores consist of sheets of small round mature appearing lymphocytes without significant atypia.  There is a vaguely nodular pattern, but is overall diffuse.  The cells are weakly positive for CD20 and coexpress CD23, CD5, CD43 and Bcl-2.  They are negative for cyclin D1, CD3, CD10 and BCL6.  The proliferation rate is 1%.  Flow cytometry identified 93% of the cells as CD5/CD200 positive B cells (CD19/CD20 positive) that are lambda restricted.  The overall findings are consistent with small  lymphocytic lymphoma.  Interrogation of the peripheral blood for a leukocytosis is recommended.    GROSS DESCRIPTION:  A. Received in saline labeled with the patient's name and DOB are multiple cores of tan soft tissue, each 2.5 x 0.1 cm. Half of a piece is placed in RPMI and sent to [facility] for possible flow cytometry. The remaining tissue is equally submitted in three cassettes for routine histology.  (LEF 12/02/2022)   Final Diagnosis performed by Orene Desanctis DO.   Electronically signed 12/04/2022     RADIOGRAPHIC STUDIES: CUP PACEART REMOTE DEVICE CHECK  Result Date: 12/10/2022 Scheduled remote reviewed. Normal device function.  1 AF episode, 1hr in duration, controlled rates Burden 0.1%, Xarelto per EPIC Next remote 91 days. LA, CVRS  [place] CORE BIOPSY (LYMPH NODES)  Result Date: 12/02/2022 INDICATION: Diffuse lymphadenopathy and history of prostate carcinoma. EXAM: ULTRASOUND GUIDED CORE BIOPSY OF LEFT SUPRACLAVICULAR LYMPH NODE MEDICATIONS: None. ANESTHESIA/SEDATION: None PROCEDURE: The procedure, risks, benefits, and alternatives were explained to the patient. Questions regarding the procedure were encouraged and answered. The patient understands and consents to the procedure.  A time-out was performed prior to initiating the procedure. The left neck was prepped with chlorhexidine in a sterile fashion, and a sterile drape was applied covering the operative field. A sterile gown and sterile gloves were used for the procedure. Local anesthesia was provided with 1% Lidocaine. An 18 gauge core biopsy device was utilized in sampling an enlarged left supraclavicular lymph node. Five total core biopsy samples were submitted in saline. Additional ultrasound was performed following the procedure. COMPLICATIONS: None immediate. FINDINGS: Numerous enlarged left cervical and supraclavicular lymph nodes are identified by ultrasound. A left supraclavicular lymph node measuring  approximately 4.1 x 1.5 x 2.5 cm was targeted for sampling. Solid core biopsy samples were obtained. IMPRESSION: Ultrasound-guided core biopsy performed of an enlarged left supraclavicular lymph node. Electronically Signed   By: Irish Lack M.D.   On: 12/02/2022 16:25    ASSESSMENT & PLAN Brandon Soto 77 y.o. male with medical history significant for coronary artery disease, CVA, hyperlipidemia, GERD, hypothyroidism, OSA, and hypertension who presents for evaluation of newly diagnosed CLL.  After review of the labs, review of the records, and discussion with the patient the patients findings are most consistent with CLL/SLL.   Today we discussed the diagnosis of CLL and what to expect moving forward.  We discussed that this tends to be a more indolent form of leukemia/lymphoma and may not require treatment initially.  PET CT scan has confirmed that the patient has at least stage I disease, but we will order CBC in order to confirm that he is not stage III or IV.  There does not be appear to be any splenic involvement.  The patient voices understanding of the findings, prognosis, steps moving forward for his diagnosis.  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  Patient is at least a stage I. -- Will plan to order prognostic panels including a CLL FISH, IgHV, and ZAP 70 -- Plan to have the patient return to clinic in 3 months time.  If everything is stable we will plan for every 6 month visits.  Orders Placed This Encounter  Procedures   CBC with Differential (Cancer Center Only)    Standing Status:   Future    Number of Occurrences:   1    Standing Expiration Date:   12/15/2023   CMP (Cancer Center only)    Standing Status:   Future    Number of Occurrences:   1    Standing Expiration Date:   12/15/2023   Lactate dehydrogenase (LDH)     Standing Status:   Future    Number of Occurrences:   1    Standing Expiration Date:   12/15/2023   Flow Cytometry, Peripheral Blood (Oncology)    Standing Status:   Future    Number of Occurrences:   1    Standing Expiration Date:   12/15/2023   FISH, CLL Prognostic Panel    Standing Status:   Future    Number of Occurrences:   1    Standing Expiration Date:   12/15/2023   ZAP-70 Panel (ASR)    Standing Status:   Future    Number of Occurrences:   1    Standing Expiration Date:   12/15/2023   IgVH Somatic Hypermutation    Standing Status:   Future    Number of Occurrences:   1  Standing Expiration Date:   12/15/2023    All questions were answered. The patient knows to call the clinic with any problems, questions or concerns.  A total of more than 60 minutes were spent on this encounter with face-to-face time and non-face-to-face time, including preparing to see the patient, ordering tests and/or medications, counseling the patient and coordination of care as outlined above.   Ulysees Barns, MD Department of Hematology/Oncology [facility] Phone: [phone] Pager: [phone] Email: [email or web address]  12/15/2022 3:14 PM

## 2022-12-16 LAB — SURGICAL PATHOLOGY

## 2022-12-17 LAB — FLOW CYTOMETRY

## 2022-12-23 LAB — FISH,CLL PROGNOSTIC PANEL

## 2022-12-25 ENCOUNTER — Other Ambulatory Visit: Payer: Self-pay | Admitting: *Deleted

## 2022-12-25 ENCOUNTER — Telehealth: Payer: Self-pay | Admitting: *Deleted

## 2022-12-25 ENCOUNTER — Telehealth: Payer: Self-pay | Admitting: Hematology and Oncology

## 2022-12-25 DIAGNOSIS — C9110 Chronic lymphocytic leukemia of B-cell type not having achieved remission: Secondary | ICD-10-CM

## 2022-12-25 NOTE — Telephone Encounter (Signed)
TCT patient regarding scheduling another lab appt. No answer but was able to lv vm message on identified phone # that another lab appt is necessary. Advised that there was a lab accident and the Zap 70 needs to be redrawn. Advised that scheduling will be calling to schedule that appt for next week. Lab order placed.

## 2022-12-28 ENCOUNTER — Telehealth: Payer: Self-pay | Admitting: Hematology and Oncology

## 2022-12-28 ENCOUNTER — Inpatient Hospital Stay: Payer: Medicare Other | Attending: Hematology and Oncology

## 2022-12-28 DIAGNOSIS — C9110 Chronic lymphocytic leukemia of B-cell type not having achieved remission: Secondary | ICD-10-CM

## 2022-12-29 ENCOUNTER — Other Ambulatory Visit: Payer: Self-pay | Admitting: Cardiovascular Disease

## 2022-12-29 NOTE — Telephone Encounter (Signed)
Prescription refill request for Xarelto received.  Indication: afib  Last office visit: Allyson Sabal 08/04/2022 Weight: 109.5 kg  Age: 77 yo  Scr: 1.12, 12/15/2022 CrCl: 86 ml/min   Refill sent.

## 2022-12-30 ENCOUNTER — Other Ambulatory Visit: Payer: Medicare Other

## 2022-12-31 LAB — ZAP-70 PANEL (ASR)

## 2022-12-31 LAB — IGVH SOMATIC HYPERMUTATION

## 2023-01-04 LAB — ZAP-70 PANEL (ASR)

## 2023-01-04 NOTE — Progress Notes (Signed)
Remote pacemaker transmission.   

## 2023-01-06 ENCOUNTER — Encounter: Payer: Self-pay | Admitting: Hematology and Oncology

## 2023-01-22 ENCOUNTER — Ambulatory Visit (HOSPITAL_COMMUNITY)
Admission: RE | Admit: 2023-01-22 | Discharge: 2023-01-22 | Disposition: A | Discharge status: Home / Self Care | Payer: Medicare Other | Source: Ambulatory Visit | Attending: Vascular Surgery | Admitting: Vascular Surgery

## 2023-01-22 ENCOUNTER — Other Ambulatory Visit (HOSPITAL_COMMUNITY): Payer: Self-pay | Admitting: Internal Medicine

## 2023-01-22 DIAGNOSIS — R609 Edema, unspecified: Secondary | ICD-10-CM | POA: Insufficient documentation

## 2023-03-09 ENCOUNTER — Ambulatory Visit (INDEPENDENT_AMBULATORY_CARE_PROVIDER_SITE_OTHER): Payer: Medicare Other

## 2023-03-09 DIAGNOSIS — I441 Atrioventricular block, second degree: Secondary | ICD-10-CM

## 2023-03-10 LAB — CUP PACEART REMOTE DEVICE CHECK
Battery Remaining Longevity: 129 mo
Battery Voltage: 3.01 V
Brady Statistic AP VP Percent: 8.78 %
Brady Statistic AP VS Percent: 0 %
Brady Statistic AS VP Percent: 90.08 %
Brady Statistic AS VS Percent: 1.14 %
Brady Statistic RA Percent Paced: 9.43 %
Brady Statistic RV Percent Paced: 98.86 %
Date Time Interrogation Session: 20240814222824
Implantable Lead Connection Status: 753985
Implantable Lead Connection Status: 753985
Implantable Lead Implant Date: 20220812
Implantable Lead Implant Date: 20220812
Implantable Lead Location: 753859
Implantable Lead Location: 753860
Implantable Lead Model: 3830
Implantable Lead Model: 5076
Implantable Pulse Generator Implant Date: 20220812
Lead Channel Impedance Value: 304 Ohm
Lead Channel Impedance Value: 380 Ohm
Lead Channel Impedance Value: 551 Ohm
Lead Channel Impedance Value: 646 Ohm
Lead Channel Pacing Threshold Amplitude: 1 V
Lead Channel Pacing Threshold Amplitude: 1.5 V
Lead Channel Pacing Threshold Pulse Width: 0.4 ms
Lead Channel Pacing Threshold Pulse Width: 0.4 ms
Lead Channel Sensing Intrinsic Amplitude: 1.625 mV
Lead Channel Sensing Intrinsic Amplitude: 1.625 mV
Lead Channel Sensing Intrinsic Amplitude: 10.25 mV
Lead Channel Sensing Intrinsic Amplitude: 10.25 mV
Lead Channel Setting Pacing Amplitude: 2.25 V
Lead Channel Setting Pacing Amplitude: 3 V
Lead Channel Setting Pacing Pulse Width: 0.4 ms
Lead Channel Setting Sensing Sensitivity: 1.2 mV
Zone Setting Status: 755011

## 2023-03-17 ENCOUNTER — Ambulatory Visit: Payer: Medicare Other | Admitting: Hematology and Oncology

## 2023-03-17 ENCOUNTER — Inpatient Hospital Stay: Payer: Medicare Other | Admitting: Hematology and Oncology

## 2023-03-17 ENCOUNTER — Other Ambulatory Visit: Payer: Self-pay | Admitting: Hematology and Oncology

## 2023-03-17 ENCOUNTER — Inpatient Hospital Stay: Payer: Medicare Other | Attending: Hematology and Oncology

## 2023-03-17 ENCOUNTER — Other Ambulatory Visit: Payer: Medicare Other

## 2023-03-17 VITALS — BP 110/59 | HR 56 | Temp 97.5°F | Resp 16 | Wt 243.4 lb

## 2023-03-17 DIAGNOSIS — Z79899 Other long term (current) drug therapy: Secondary | ICD-10-CM | POA: Diagnosis not present

## 2023-03-17 DIAGNOSIS — C9110 Chronic lymphocytic leukemia of B-cell type not having achieved remission: Secondary | ICD-10-CM | POA: Diagnosis present

## 2023-03-17 DIAGNOSIS — Z7901 Long term (current) use of anticoagulants: Secondary | ICD-10-CM | POA: Insufficient documentation

## 2023-03-17 LAB — CBC WITH DIFFERENTIAL (CANCER CENTER ONLY)
Abs Immature Granulocytes: 0.05 10*3/uL (ref 0.00–0.07)
Basophils Absolute: 0 10*3/uL (ref 0.0–0.1)
Basophils Relative: 1 %
Eosinophils Absolute: 0.1 10*3/uL (ref 0.0–0.5)
Eosinophils Relative: 1 %
HCT: 45.4 % (ref 39.0–52.0)
Hemoglobin: 14.9 g/dL (ref 13.0–17.0)
Immature Granulocytes: 1 %
Lymphocytes Relative: 50 %
Lymphs Abs: 4.2 10*3/uL — ABNORMAL HIGH (ref 0.7–4.0)
MCH: 31.6 pg (ref 26.0–34.0)
MCHC: 32.8 g/dL (ref 30.0–36.0)
MCV: 96.4 fL (ref 80.0–100.0)
Monocytes Absolute: 0.8 10*3/uL (ref 0.1–1.0)
Monocytes Relative: 9 %
Neutro Abs: 3.2 10*3/uL (ref 1.7–7.7)
Neutrophils Relative %: 38 %
Platelet Count: 139 10*3/uL — ABNORMAL LOW (ref 150–400)
RBC: 4.71 MIL/uL (ref 4.22–5.81)
RDW: 13.4 % (ref 11.5–15.5)
WBC Count: 8.3 10*3/uL (ref 4.0–10.5)
nRBC: 0 % (ref 0.0–0.2)

## 2023-03-17 LAB — LACTATE DEHYDROGENASE: LDH: 180 U/L (ref 98–192)

## 2023-03-17 LAB — CMP (CANCER CENTER ONLY)
ALT: 20 U/L (ref 0–44)
AST: 19 U/L (ref 15–41)
Albumin: 4.5 g/dL (ref 3.5–5.0)
Alkaline Phosphatase: 54 U/L (ref 38–126)
Anion gap: 6 (ref 5–15)
BUN: 21 mg/dL (ref 8–23)
CO2: 31 mmol/L (ref 22–32)
Calcium: 9.5 mg/dL (ref 8.9–10.3)
Chloride: 99 mmol/L (ref 98–111)
Creatinine: 0.97 mg/dL (ref 0.61–1.24)
GFR, Estimated: >60 mL/min (ref >60)
Glucose, Bld: 153 mg/dL — ABNORMAL HIGH (ref 70–99)
Potassium: 3.9 mmol/L (ref 3.5–5.1)
Sodium: 136 mmol/L (ref 135–145)
Total Bilirubin: 1.3 mg/dL — ABNORMAL HIGH (ref 0.3–1.2)
Total Protein: 7.1 g/dL (ref 6.5–8.1)

## 2023-03-17 NOTE — Progress Notes (Signed)
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 Patient found to have  diffuse lymphadenopathy in the neck, chest, abdomen and pelvis with mild hypermetabolism. Most of this disease is Deauville 3. Findings most likely due to a low grade lymphoma such as CLL 12/02/2022: Ultrasound guided core biopsy performed findings consistent with SLL/CLL. 12/15/2022: establish care with Dr. Leonides Schanz   Interval History:  Brandon Soto 77 y.o. male with medical history significant for SLL/CLL who presents for a follow up visit. The patient's last visit was on 12/15/2022. In the interim since the last visit he has had no major changes in his health.  On exam today [name] reports that he has been well overall in the interim since our last visit.  He reports that he has noticed some increase in the size of his lymph nodes.  He reports that they are not tender to palpation and are not causing any difficulty on a day-to-day basis.  He reports his energy levels are so-so currently ranking his energy is a 5 or 6 out of 10.  His appetite has been good and he has not been having any B symptoms such as fevers, chills, sweats.  He reports he is also not having any infectious symptoms such as runny nose, sore throat, or cough.  He does have some occasional runny nose.  His weight has been steady and is currently 243 pounds.  He notes that overall he feels well.  He recently started on finasteride to increase urine flow with Dr. Annabell Howells.  He otherwise denies any fevers, chills, sweats, nausea,  vomiting or diarrhea.  A full 10 point ROS is otherwise negative.  MEDICAL HISTORY:  Past Medical History:  Diagnosis Date   BPH (benign prostatic hyperplasia)    Cataract    bilateral   Coronary artery disease    s/p LAD intervention 2001   CVA (cerebral infarction)    Dyslipidemia    GERD (gastroesophageal reflux disease)    Hypertension    Hypothyroidism    "shrank w/radiation ?1990's" (06/27/2012)   Left knee DJD    Myocardial infarction (HCC)    OSA (obstructive sleep apnea) 11/29/2014   Osteoarthritis    PONV (postoperative nausea and vomiting)    Sleep apnea    cpap   Small intestinal bacterial overgrowth 07/05/2018   Stroke [facility])     SURGICAL HISTORY: Past Surgical History:  Procedure Laterality Date   angioplasty N/A    CARDIAC CATHETERIZATION  ~ 2009   CHOLECYSTECTOMY  ~ 2006   COLONOSCOPY  06/2014   CORONARY ANGIOPLASTY     CORONARY ANGIOPLASTY WITH STENT PLACEMENT  2001   "1" (06/27/2012)   LOOP RECORDER IMPLANT  12-15-13   MDT LinQ implanted by Dr Johney Frame for cryptogenic stroke   LOOP RECORDER IMPLANT N/A 12/15/2013   Procedure: LOOP RECORDER IMPLANT;  Surgeon: Gardiner Rhyme, MD;  Location: [facility] CATH LAB;  Service: Cardiovascular;  Laterality: N/A;   LOOP RECORDER REMOVAL N/A 03/07/2021   Procedure: LOOP RECORDER REMOVAL;  Surgeon: Lanier Prude, MD;  Location: [facility] INVASIVE CV LAB;  Service: Cardiovascular;  Laterality:  N/A;   MM CORP SCREENING MAMMO BIL/CAD ([facility] HX)     PACEMAKER IMPLANT N/A 03/07/2021   Procedure: PACEMAKER IMPLANT;  Surgeon: Lanier Prude, MD;  Location: [facility] INVASIVE CV LAB;  Service: Cardiovascular;  Laterality: N/A;   PARTIAL KNEE ARTHROPLASTY  06/27/2012   Procedure: UNICOMPARTMENTAL KNEE;  Surgeon: Nilda Simmer, MD;  Location: [facility] OR;  Service: Orthopedics;  Laterality: Left;  left unicompartmental knee   SHOULDER ARTHROSCOPY WITH ROTATOR CUFF REPAIR AND SUBACROMIAL DECOMPRESSION Left 2005   (06/27/2012)   stent placement N/A     TEE WITHOUT CARDIOVERSION N/A 01/03/2014   Procedure: TRANSESOPHAGEAL ECHOCARDIOGRAM (TEE);  Surgeon: Thurmon Fair, MD;  Location: [facility] ENDOSCOPY;  Service: Cardiovascular;  Laterality: N/A;    SOCIAL HISTORY: Social History   Socioeconomic History   Marital status: Married    Spouse name: Not on file   Number of children: 2   Years of education: Not on file   Highest education level: Not on file  Occupational History   Occupation: retired  Tobacco Use   Smoking status: Never   Smokeless tobacco: Never  Vaping Use   Vaping status: Never Used  Substance and Sexual Activity   Alcohol use: No    Alcohol/week: 0.0 standard drinks of alcohol   Drug use: No   Sexual activity: Yes  Other Topics Concern   Not on file  Social History Narrative   Married and retired, 2 children   Stays busy keeping up his [place] home plus he has a home in [place] and takes care of his mother-in-law's home   No alcohol tobacco or drug use   Social Determinants of Corporate investment banker Strain: Not on file  Food Insecurity: No Food Insecurity (12/15/2022)   Hunger Vital Sign    Worried About Running Out of Food in the Last Year: Never true    Ran Out of Food in the Last Year: Never true  Transportation Needs: No Transportation Needs (12/15/2022)   PRAPARE - Administrator, Civil Service (Medical): No    Lack of Transportation (Non-Medical): No  Physical Activity: Insufficiently Active (02/08/2019)   Received from [facility] visits prior to 09/26/2022., [facility] visits prior to 09/26/2022.   Exercise Vital Sign    Days of Exercise per Week: 3 days    Minutes of Exercise per Session: 30 min  Stress: Not on file  Social Connections: Not on file  Intimate Partner Violence: Patient Unable To Answer (12/15/2022)   Humiliation, Afraid, Rape, and Kick questionnaire    Fear of Current or Ex-Partner: Patient unable to answer     Emotionally Abused: Patient unable to answer    Physically Abused: Patient unable to answer    Sexually Abused: Patient unable to answer    FAMILY HISTORY: Family History  Problem Relation Age of Onset   Heart attack Mother    Heart disease Brother    Liver cancer Brother        deceased   Cancer - Lung Brother        deceased   Esophageal cancer Brother     ALLERGIES:  has No Known Allergies.  MEDICATIONS:  Current Outpatient Medications  Medication Sig Dispense Refill   acetaminophen (TYLENOL) 500 MG tablet Take 1,000 mg by mouth every 6 (six) hours as needed for mild pain (For pain in hands).     amLODipine (NORVASC) 5 MG tablet Take 1 tablet (5 mg  total) by mouth daily. 90 tablet 3   chlorthalidone (HYGROTON) 25 MG tablet Take 1 tablet (25 mg total) by mouth daily. 90 tablet 3   Cyanocobalamin (B-12) 5000 MCG CAPS Take 5,000 mcg by mouth daily.     esomeprazole (NEXIUM) 40 MG capsule Take 40 mg by mouth daily at 6 (six) AM.     JARDIANCE 10 MG TABS tablet Take 10 mg by mouth daily.     levothyroxine (SYNTHROID, LEVOTHROID) 175 MCG tablet Take 175 mcg by mouth daily before breakfast.     losartan (COZAAR) 100 MG tablet Take 1 tablet (100 mg total) by mouth daily. 90 tablet 3   Omega-3 Fatty Acids ([employer] FISH OIL) 1000 MG CAPS Take 1,000 mg by mouth daily.     pregabalin (LYRICA) 150 MG capsule Take 150 mg by mouth 2 (two) times daily.     rivaroxaban (XARELTO) 20 MG TABS tablet TAKE 1 TABLET BY MOUTH DAILY  WITH SUPPER 90 tablet 1   rosuvastatin (CRESTOR) 20 MG tablet TAKE 1 TABLET BY MOUTH AT  BEDTIME 90 tablet 3   tamsulosin (FLOMAX) 0.4 MG CAPS capsule Take 0.4 mg by mouth in the morning and at bedtime.     No current facility-administered medications for this visit.    REVIEW OF SYSTEMS:   Constitutional: ( - ) fevers, ( - )  chills , ( - ) night sweats Eyes: ( - ) blurriness of vision, ( - ) double vision, ( - ) watery eyes Ears, nose, mouth, throat, and face: ( - )  mucositis, ( - ) sore throat Respiratory: ( - ) cough, ( - ) dyspnea, ( - ) wheezes Cardiovascular: ( - ) palpitation, ( - ) chest discomfort, ( - ) lower extremity swelling Gastrointestinal:  ( - ) nausea, ( - ) heartburn, ( - ) change in bowel habits Skin: ( - ) abnormal skin rashes Lymphatics: ( - ) new lymphadenopathy, ( - ) easy bruising Neurological: ( - ) numbness, ( - ) tingling, ( - ) new weaknesses Behavioral/Psych: ( - ) mood change, ( - ) new changes  All other systems were reviewed with the patient and are negative.  PHYSICAL EXAMINATION: Vitals:   03/17/23 1047  BP: (!) 110/59  Pulse: (!) 56  Resp: 16  Temp: (!) 97.5 F (36.4 C)  SpO2: 100%   Filed Weights   03/17/23 1047  Weight: 243 lb 6.4 oz (110.4 kg)    GENERAL: Well-appearing elderly Caucasian male, alert, no distress and comfortable SKIN: skin color, texture, turgor are normal, no rashes or significant lesions EYES: conjunctiva are pink and non-injected, sclera clear LUNGS: clear to auscultation and percussion with normal breathing effort HEART: regular rate & rhythm and no murmurs and no lower extremity edema Musculoskeletal: no cyanosis of digits and no clubbing  PSYCH: alert & oriented x 3, fluent speech NEURO: no focal motor/sensory deficits  LABORATORY DATA:  I have reviewed the data as listed    Latest Ref Rng & Units 03/17/2023   10:16 AM 12/15/2022    1:49 PM 08/04/2022   11:36 AM  CBC  WBC 4.0 - 10.5 K/uL 8.3  7.8  9.8   Hemoglobin 13.0 - 17.0 g/dL 40.9  81.1  91.4   Hematocrit 39.0 - 52.0 % 45.4  43.4  48.3   Platelets 150 - 400 K/uL 139  143  165        Latest Ref Rng & Units 03/17/2023   10:16 AM 12/15/2022  1:49 PM 08/04/2022   11:36 AM  CMP  Glucose 70 - 99 mg/dL 960  454  098   BUN 8 - 23 mg/dL 21  18  20    Creatinine 0.61 - 1.24 mg/dL 1.19  1.47  8.29   Sodium 135 - 145 mmol/L 136  136  136   Potassium 3.5 - 5.1 mmol/L 3.9  3.5  3.8   Chloride 98 - 111 mmol/L 99  100  94   CO2  22 - 32 mmol/L 31  29  25    Calcium 8.9 - 10.3 mg/dL 9.5  9.5  56.2   Total Protein 6.5 - 8.1 g/dL 7.1  6.7    Total Bilirubin 0.3 - 1.2 mg/dL 1.3  1.3    Alkaline Phos 38 - 126 U/L 54  50    AST 15 - 41 U/L 19  19    ALT 0 - 44 U/L 20  19     RADIOGRAPHIC STUDIES: CUP PACEART REMOTE DEVICE CHECK  Result Date: 03/10/2023 Scheduled remote reviewed. Normal device function.  Next remote 91 days. Hassell Halim, RN, CCDS, [employer]   ASSESSMENT & PLAN Brandon Soto 77 y.o. male with medical history significant for SLL/CLL who presents for a follow up visit.   After review of the labs, review of the records, and discussion with the patient the patients findings are most consistent with CLL/SLL.    Previously we discussed the diagnosis of CLL and what to expect moving forward.  We discussed that this tends to be a more indolent form of leukemia/lymphoma and may not require treatment initially.  PET CT scan has confirmed that the patient has at least stage I disease, but we will order CBC in order to confirm that he is not stage III or IV.  There does not be appear to be any splenic involvement.  The patient voices understanding of the findings, prognosis, steps moving forward for his diagnosis.   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  Patient is at least a stage I. -- prognostic panel showed the primary mutation is deletion 13.  IGHV was not detected and ZAP70 was positive. -- labs today show white blood cell 8.3, hemoglobin 14.9, MCV 96.4, and platelets of 139 -- no indication for routine images unless patient were to develop new symptoms or changes in blood counts.  -- Plan to have the patient return to clinic in 6 month's time.     No orders of the defined types were placed in this encounter.   All questions were answered. The patient knows to call the clinic with any  problems, questions or concerns.  A total of more than 30 minutes were spent on this encounter with face-to-face time and non-face-to-face time, including preparing to see the patient, ordering tests and/or medications, counseling the patient and coordination of care as outlined above.   Ulysees Barns, MD Department of Hematology/Oncology [facility] at [facility] Phone: [phone] Pager: [phone] Email: [email or web address]  03/27/2023 12:32 PM

## 2023-03-24 NOTE — Progress Notes (Signed)
Remote pacemaker transmission.   

## 2023-04-23 ENCOUNTER — Other Ambulatory Visit: Payer: Self-pay | Admitting: Cardiovascular Disease

## 2023-06-08 ENCOUNTER — Ambulatory Visit (INDEPENDENT_AMBULATORY_CARE_PROVIDER_SITE_OTHER): Payer: Medicare Other

## 2023-06-08 DIAGNOSIS — I441 Atrioventricular block, second degree: Secondary | ICD-10-CM | POA: Diagnosis not present

## 2023-06-10 LAB — CUP PACEART REMOTE DEVICE CHECK
Battery Remaining Longevity: 123 mo
Battery Voltage: 3.01 V
Brady Statistic AP VP Percent: 5.73 %
Brady Statistic AP VS Percent: 0 %
Brady Statistic AS VP Percent: 93.5 %
Brady Statistic AS VS Percent: 0.77 %
Brady Statistic RA Percent Paced: 6.04 %
Brady Statistic RV Percent Paced: 99.23 %
Date Time Interrogation Session: 20241112000542
Implantable Lead Connection Status: 753985
Implantable Lead Connection Status: 753985
Implantable Lead Implant Date: 20220812
Implantable Lead Implant Date: 20220812
Implantable Lead Location: 753859
Implantable Lead Location: 753860
Implantable Lead Model: 3830
Implantable Lead Model: 5076
Implantable Pulse Generator Implant Date: 20220812
Lead Channel Impedance Value: 323 Ohm
Lead Channel Impedance Value: 399 Ohm
Lead Channel Impedance Value: 494 Ohm
Lead Channel Impedance Value: 665 Ohm
Lead Channel Pacing Threshold Amplitude: 1.25 V
Lead Channel Pacing Threshold Amplitude: 1.25 V
Lead Channel Pacing Threshold Pulse Width: 0.4 ms
Lead Channel Pacing Threshold Pulse Width: 0.4 ms
Lead Channel Sensing Intrinsic Amplitude: 1.75 mV
Lead Channel Sensing Intrinsic Amplitude: 1.75 mV
Lead Channel Sensing Intrinsic Amplitude: 10.125 mV
Lead Channel Sensing Intrinsic Amplitude: 10.125 mV
Lead Channel Setting Pacing Amplitude: 2.5 V
Lead Channel Setting Pacing Amplitude: 2.5 V
Lead Channel Setting Pacing Pulse Width: 0.4 ms
Lead Channel Setting Sensing Sensitivity: 1.2 mV
Zone Setting Status: 755011

## 2023-06-11 ENCOUNTER — Other Ambulatory Visit: Payer: Self-pay | Admitting: Physician Assistant

## 2023-06-11 DIAGNOSIS — R1032 Left lower quadrant pain: Secondary | ICD-10-CM

## 2023-06-22 ENCOUNTER — Ambulatory Visit
Admission: RE | Admit: 2023-06-22 | Discharge: 2023-06-22 | Disposition: A | Discharge status: Home / Self Care | Payer: Medicare Other | Source: Ambulatory Visit | Attending: Physician Assistant | Admitting: Physician Assistant

## 2023-06-22 DIAGNOSIS — R1032 Left lower quadrant pain: Secondary | ICD-10-CM

## 2023-06-22 MED ORDER — IOPAMIDOL (ISOVUE-300) INJECTION 61%
500.0000 mL | Freq: Once | INTRAVENOUS | Status: AC | PRN
  Administered 2023-06-22: 100 mL via INTRAVENOUS

## 2023-07-02 NOTE — Progress Notes (Signed)
Remote pacemaker transmission.   

## 2023-08-02 ENCOUNTER — Encounter: Payer: Self-pay | Admitting: Cardiovascular Disease

## 2023-08-02 ENCOUNTER — Ambulatory Visit: Payer: Medicare Other | Attending: Cardiovascular Disease | Admitting: Cardiovascular Disease

## 2023-08-02 VITALS — BP 112/64 | HR 63 | Ht 73.0 in | Wt 240.0 lb

## 2023-08-02 DIAGNOSIS — E7800 Pure hypercholesterolemia, unspecified: Secondary | ICD-10-CM | POA: Diagnosis not present

## 2023-08-02 DIAGNOSIS — R0683 Snoring: Secondary | ICD-10-CM | POA: Diagnosis not present

## 2023-08-02 DIAGNOSIS — I480 Paroxysmal atrial fibrillation: Secondary | ICD-10-CM

## 2023-08-02 DIAGNOSIS — I2510 Atherosclerotic heart disease of native coronary artery without angina pectoris: Secondary | ICD-10-CM | POA: Diagnosis not present

## 2023-08-02 DIAGNOSIS — I442 Atrioventricular block, complete: Secondary | ICD-10-CM

## 2023-08-02 DIAGNOSIS — I10 Essential (primary) hypertension: Secondary | ICD-10-CM | POA: Diagnosis not present

## 2023-08-02 MED ORDER — CHLORTHALIDONE 25 MG PO TABS: 25.0000 mg | ORAL_TABLET | Freq: Every day | ORAL | 3 refills | Status: AC

## 2023-08-02 MED ORDER — RIVAROXABAN 20 MG PO TABS: 20.0000 mg | ORAL_TABLET | Freq: Every day | ORAL | 3 refills | Status: DC

## 2023-08-02 MED ORDER — AMLODIPINE BESYLATE 5 MG PO TABS: 5.0000 mg | ORAL_TABLET | Freq: Every day | ORAL | 3 refills | Status: DC

## 2023-08-02 MED ORDER — ROSUVASTATIN CALCIUM 20 MG PO TABS: 20.0000 mg | ORAL_TABLET | Freq: Every day | ORAL | 3 refills | Status: DC

## 2023-08-02 MED ORDER — LOSARTAN POTASSIUM 100 MG PO TABS: 100.0000 mg | ORAL_TABLET | Freq: Every day | ORAL | 3 refills | Status: AC

## 2023-08-02 NOTE — Assessment & Plan Note (Signed)
 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 Steffanie Dunn 03/07/2021 with removal of his loop recorder.  He felt clinically improved after pacemaker insertion.

## 2023-08-02 NOTE — Assessment & Plan Note (Signed)
 History of hyperlipidemia on statin therapy with lipid profile performed 05/13/2023 revealing a total cholesterol of 155, LDL of 73 and HDL 51.

## 2023-08-02 NOTE — Assessment & Plan Note (Signed)
 History of persistent A-fib now on Xarelto oral anticoagulation.  He has had a remote history of stroke.

## 2023-08-02 NOTE — Addendum Note (Signed)
 Addended by: Bernita Buffy on: 08/02/2023 11:52 AM   Modules accepted: Orders

## 2023-08-02 NOTE — Patient Instructions (Signed)

## 2023-08-02 NOTE — Progress Notes (Signed)
 08/02/2023 [name]   October 31, 1945  [id number]  Primary Physician Brandon Leita DEL, MD Primary Cardiologist: Brandon JINNY Lesches MD FACP, [place], [place], [place]  HPI:  Brandon Soto is a 78 y.o.   mildly overweight, married, Caucasian male father of 2, grandfather to 3 grandchildren who I last saw in the office 08/04/2022.  His wife Brandon Soto is also a patient of mine... He has a history of CAD status post LAD intervention by Dr. Larnell Soto in 2001 in the setting of a myocardial infarction. I catheterization him in 2004 revealing a patent stent with an anteroapical wall motion abnormality and EF of 45-50%. His other problems include hypertension, hyperlipidemia, and hypothyroidism. He denies chest pain or shortness of breath. His last Myoview  performed in October of 2011 showed apical scar, and echo showed a normal EF. His most recent lab work revealed a total cholesterol of 146, LDL of 72, and HDL 49.he had a left total knee replacement performed by Dr. Beryl Soto 06/27/12 which was uncomplicated. He denies chest pain or shortness of breath. His primary care physician, Brandon Soto, follows his lipid profile closely. He had a recent stroke apparently in 2 vascular territories. Workup has been unrevealing including carotid Dopplers which were essentially normal and a transthoracic echo that did not show an embolic source. Dr. Lynwood Soto placed a loop recorder to rule out paroxysmal atrial fibrillation as a cause. He was originally placed on aspirin and Plavix  however the loop recorder did reveal short bursts of A. fib and he was switched to Xarelto . He also has had a sleep study and has been placed on C Pap followed by Dr. Shlomo.    He has noticed some increasing dyspnea on exertion and some bradycardia.    He does have a history of thyroid  disease as well.  He denies chest pain.  Recent 2D echo performed 10/13/2019 revealed normal LV systolic function with grade 1 diastolic dysfunction.   I performed an event  monitoring revealing episodes of complete heart block with pauses longest 7 seconds and multiple shorter pauses but still significant.  He is not on any negative chronotropic drugs.  He was seen by Dr. Fernande in for evaluation of this he did not feel that he was a candidate for device implantation.  Most of his long pauses were in the a.m. hours.   Since I saw him a year ago he has done fine.  Dr. Cindie did implant a pacemaker 03/07/2021 with removal of his loop recorder for complete heart block.  He feels clinically improved.  He denies chest pain or shortness of breath.  He was recently diagnosed with prostate cancer and is being treated by Dr. Wrenn as well as CLL treated by Dr. Federico.   Current Meds  Medication Sig   acetaminophen  (TYLENOL ) 500 MG tablet Take 1,000 mg by mouth every 6 (six) hours as needed for mild pain (For pain in hands).   amLODipine  (NORVASC ) 5 MG tablet TAKE 1 TABLET BY MOUTH DAILY   chlorthalidone  (HYGROTON ) 25 MG tablet Take 1 tablet (25 mg total) by mouth daily.   Cyanocobalamin (B-12) 5000 MCG CAPS Take 5,000 mcg by mouth daily.   esomeprazole  (NEXIUM ) 40 MG capsule Take 40 mg by mouth daily at 6 (six) AM.   JARDIANCE  10 MG TABS tablet Take 10 mg by mouth daily.   levothyroxine  (SYNTHROID , LEVOTHROID) 175 MCG tablet Take 175 mcg by mouth daily before breakfast.   losartan  (COZAAR ) 100 MG  tablet Take 1 tablet (100 mg total) by mouth daily.   Omega-3 Fatty Acids ([employer]S FISH OIL) 1000 MG CAPS Take 1,000 mg by mouth daily.   OZEMPIC, 0.25 OR 0.5 MG/DOSE, 2 MG/3ML SOPN INJECT 1/4 (ONE-FOURTH) MG SUBCUTANEOUSLY ONCE A WEEK FOR 2 WEEKS THEN INCREASE TO 1/2 (ONE-HALF) MG ONCE A WEEK THEREAFTER   pregabalin  (LYRICA ) 150 MG capsule Take 150 mg by mouth 2 (two) times daily.   rivaroxaban  (XARELTO ) 20 MG TABS tablet TAKE 1 TABLET BY MOUTH DAILY  WITH SUPPER   rosuvastatin  (CRESTOR ) 20 MG tablet TAKE 1 TABLET BY MOUTH AT  BEDTIME   tamsulosin  (FLOMAX ) 0.4 MG CAPS capsule Take 0.4  mg by mouth in the morning and at bedtime.     No Known Allergies  Social History   Socioeconomic History   Marital status: Married    Spouse name: Not on file   Number of children: 2   Years of education: Not on file   Highest education level: Not on file  Occupational History   Occupation: retired  Tobacco Use   Smoking status: Never   Smokeless tobacco: Never  Vaping Use   Vaping status: Never Used  Substance and Sexual Activity   Alcohol use: No    Alcohol/week: 0.0 standard drinks of alcohol   Drug use: No   Sexual activity: Yes  Other Topics Concern   Not on file  Social History Narrative   Married and retired, 2 children   Stays busy keeping up his [place] home plus he has a home in [place]  and takes care of his mother-in-law's home   No alcohol tobacco or drug use   Social Drivers of Corporate Investment Banker Strain: Not on file  Food Insecurity: No Food Insecurity (12/15/2022)   Hunger Vital Sign    Worried About Running Out of Food in the Last Year: Never true    Ran Out of Food in the Last Year: Never true  Transportation Needs: No Transportation Needs (12/15/2022)   PRAPARE - Administrator, Civil Service (Medical): No    Lack of Transportation (Non-Medical): No  Physical Activity: Insufficiently Active (02/08/2019)   Received from [facility] visits prior to 09/26/2022., [facility] visits prior to 09/26/2022.   Exercise Vital Sign    Days of Exercise per Week: 3 days    Minutes of Exercise per Session: 30 min  Stress: Not on file  Social Connections: Not on file  Intimate Partner Violence: Patient Unable To Answer (12/15/2022)   Humiliation, Afraid, Rape, and Kick questionnaire    Fear of Current or Ex-Partner: Patient unable to answer    Emotionally Abused: Patient unable to answer    Physically Abused: Patient unable to answer    Sexually Abused: Patient unable to answer      Review of Systems: General: negative for chills, fever, night sweats or weight changes.  Cardiovascular: negative for chest pain, dyspnea on exertion, edema, orthopnea, palpitations, paroxysmal nocturnal dyspnea or shortness of breath Dermatological: negative for rash Respiratory: negative for cough or wheezing Urologic: negative for hematuria Abdominal: negative for nausea, vomiting, diarrhea, bright red blood per rectum, melena, or hematemesis Neurologic: negative for visual changes, syncope, or dizziness All other systems reviewed and are otherwise negative except as noted above.    Blood pressure 112/64, pulse 63, height 6' 1 (1.854 m), weight 240 lb (108.9 kg), SpO2 95%.  General appearance: alert and no distress Neck:  no adenopathy, no carotid bruit, no JVD, supple, symmetrical, trachea midline, and thyroid  not enlarged, symmetric, no tenderness/mass/nodules Lungs: clear to auscultation bilaterally Heart: regular rate and rhythm, S1, S2 normal, no murmur, click, rub or gallop Extremities: extremities normal, atraumatic, no cyanosis or edema Pulses: 2+ and symmetric Skin: Skin color, texture, turgor normal. No rashes or lesions Neurologic: Grossly normal  EKG EKG Interpretation Date/Time:  Monday August 02 2023 10:36:14 EST Ventricular Rate:  56 PR Interval:    QRS Duration:  186 QT Interval:  498 QTC Calculation: 480 R Axis:   -52  Text Interpretation: Ventricular-paced rhythm with occasional Premature ventricular complexes When compared with ECG of 07-Mar-2021 14:23, Premature ventricular complexes are now Present Vent. rate has decreased BY   2 BPM Confirmed by Court Carrier [phone]) on 08/02/2023 10:44:19 AM    ASSESSMENT AND PLAN:   Hypertension History of essential hypertension her blood pressure measured today at 112/64.  He is on amlodipine , chlorthalidone  and losartan .  Hypercholesteremia History of hyperlipidemia on statin therapy with lipid profile  performed 05/13/2023 revealing a total cholesterol of 155, LDL of 73 and HDL 51.  PAF (paroxysmal atrial fibrillation) (HCC) History of persistent A-fib now on Xarelto  oral anticoagulation.  He has had a remote history of stroke.  Coronary artery disease History of CAD status post LAD intervention by Dr. Larnell Soto in 2001 in the setting of a STEMI.  I catheterized him in 2004 revealing a patent stent with an anteroapical wall motion abnormality and EF of 45 to 50%.  He was remained asymptomatic since.  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 Ole Holts 03/07/2021 with removal of his loop recorder.  He felt clinically improved after pacemaker insertion.     Carrier DOROTHA Court MD FACP,FACC,FAHA, [facility] 08/02/2023 10:52 AM

## 2023-08-02 NOTE — Assessment & Plan Note (Signed)
 History of CAD status post LAD intervention by Dr. Jorje Guild in 2001 in the setting of a STEMI.  I catheterized him in 2004 revealing a patent stent with an anteroapical wall motion abnormality and EF of 45 to 50%.  He was remained asymptomatic since.

## 2023-08-02 NOTE — Assessment & Plan Note (Signed)
 History of essential hypertension her blood pressure measured today at 112/64.  He is on amlodipine, chlorthalidone and losartan.

## 2023-09-02 DIAGNOSIS — L814 Other melanin hyperpigmentation: Secondary | ICD-10-CM | POA: Diagnosis not present

## 2023-09-02 DIAGNOSIS — D485 Neoplasm of uncertain behavior of skin: Secondary | ICD-10-CM | POA: Diagnosis not present

## 2023-09-02 DIAGNOSIS — L578 Other skin changes due to chronic exposure to nonionizing radiation: Secondary | ICD-10-CM | POA: Diagnosis not present

## 2023-09-02 DIAGNOSIS — T50905A Adverse effect of unspecified drugs, medicaments and biological substances, initial encounter: Secondary | ICD-10-CM | POA: Diagnosis not present

## 2023-09-02 DIAGNOSIS — L570 Actinic keratosis: Secondary | ICD-10-CM | POA: Diagnosis not present

## 2023-09-02 DIAGNOSIS — D229 Melanocytic nevi, unspecified: Secondary | ICD-10-CM | POA: Diagnosis not present

## 2023-09-02 DIAGNOSIS — L821 Other seborrheic keratosis: Secondary | ICD-10-CM | POA: Diagnosis not present

## 2023-09-02 DIAGNOSIS — D1801 Hemangioma of skin and subcutaneous tissue: Secondary | ICD-10-CM | POA: Diagnosis not present

## 2023-09-02 DIAGNOSIS — Z85828 Personal history of other malignant neoplasm of skin: Secondary | ICD-10-CM | POA: Diagnosis not present

## 2023-09-07 ENCOUNTER — Ambulatory Visit (INDEPENDENT_AMBULATORY_CARE_PROVIDER_SITE_OTHER): Payer: Medicare Other

## 2023-09-07 DIAGNOSIS — I441 Atrioventricular block, second degree: Secondary | ICD-10-CM | POA: Diagnosis not present

## 2023-09-08 LAB — CUP PACEART REMOTE DEVICE CHECK
Battery Remaining Longevity: 124 mo
Battery Voltage: 3.01 V
Brady Statistic AP VP Percent: 3.84 %
Brady Statistic AP VS Percent: 0 %
Brady Statistic AS VP Percent: 95.2 %
Brady Statistic AS VS Percent: 0.66 %
Brady Statistic RA Percent Paced: 1.13 %
Brady Statistic RV Percent Paced: 98.65 %
Date Time Interrogation Session: 20250211141953
Implantable Lead Connection Status: 753985
Implantable Lead Connection Status: 753985
Implantable Lead Implant Date: 20220812
Implantable Lead Implant Date: 20220812
Implantable Lead Location: 753859
Implantable Lead Location: 753860
Implantable Lead Model: 3830
Implantable Lead Model: 5076
Implantable Pulse Generator Implant Date: 20220812
Lead Channel Impedance Value: 304 Ohm
Lead Channel Impedance Value: 399 Ohm
Lead Channel Impedance Value: 475 Ohm
Lead Channel Impedance Value: 665 Ohm
Lead Channel Pacing Threshold Amplitude: 1 V
Lead Channel Pacing Threshold Amplitude: 1 V
Lead Channel Pacing Threshold Pulse Width: 0.4 ms
Lead Channel Pacing Threshold Pulse Width: 0.4 ms
Lead Channel Sensing Intrinsic Amplitude: 10.875 mV
Lead Channel Sensing Intrinsic Amplitude: 10.875 mV
Lead Channel Sensing Intrinsic Amplitude: 3 mV
Lead Channel Sensing Intrinsic Amplitude: 3 mV
Lead Channel Setting Pacing Amplitude: 2.25 V
Lead Channel Setting Pacing Amplitude: 2.25 V
Lead Channel Setting Pacing Pulse Width: 0.4 ms
Lead Channel Setting Sensing Sensitivity: 1.2 mV
Zone Setting Status: 755011

## 2023-09-11 ENCOUNTER — Encounter: Payer: Self-pay | Admitting: Cardiology

## 2023-09-13 ENCOUNTER — Other Ambulatory Visit: Payer: Self-pay | Admitting: Hematology and Oncology

## 2023-09-13 DIAGNOSIS — C61 Malignant neoplasm of prostate: Secondary | ICD-10-CM | POA: Diagnosis not present

## 2023-09-13 DIAGNOSIS — C9110 Chronic lymphocytic leukemia of B-cell type not having achieved remission: Secondary | ICD-10-CM

## 2023-09-13 NOTE — Progress Notes (Unsigned)
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 Patient found to have  diffuse lymphadenopathy in the neck, chest, abdomen and pelvis with mild hypermetabolism. Most of this disease is Deauville 3. Findings most likely due to a low grade lymphoma such as CLL 12/02/2022: Ultrasound guided core biopsy performed findings consistent with SLL/CLL. 12/15/2022: establish care with Dr. Leonides Schanz   Interval History:  [name] 78 y.o. male with medical history significant for SLL/CLL who presents for a follow up visit. The patient's last visit was on 03/17/2023. In the interim since the last visit he has had no major changes in his health.  On exam today Mr. Piscopo reports he has been doing well overall interim since our last visit.  He reports that the lymph nodes in his neck do sometimes feel little larger and more tender.  He reports that he knows that they are not causing any difficulty with swallowing or breathing.  He notes that they are not currently painful.  He does wear CPAP at night.  He notes he is lost about 5 pounds in the interim since her last visit while taking Ozempic.  He reports he is not having any lightheadedness, dizziness, shortness of breath.  He denies any B symptoms.  Reports his energy levels have been good.  Overall he feels well and is willing and able to continue on observation for his SLL. He otherwise denies any fevers, chills, sweats, nausea, vomiting or diarrhea.  A full 10 point ROS is otherwise negative.  MEDICAL HISTORY:  Past Medical  History:  Diagnosis Date   BPH (benign prostatic hyperplasia)    Cataract    bilateral   Coronary artery disease    s/p LAD intervention 2001   CVA (cerebral infarction)    Dyslipidemia    GERD (gastroesophageal reflux disease)    Hypertension    Hypothyroidism    "shrank w/radiation ?1990's" (06/27/2012)   Left knee DJD    Myocardial infarction (HCC)    OSA (obstructive sleep apnea) 11/29/2014   Osteoarthritis    PONV (postoperative nausea and vomiting)    Sleep apnea    cpap   Small intestinal bacterial overgrowth 07/05/2018   Stroke [facility])     SURGICAL HISTORY: Past Surgical History:  Procedure Laterality Date   angioplasty N/A    CARDIAC CATHETERIZATION  ~ 2009   CHOLECYSTECTOMY  ~ 2006   COLONOSCOPY  06/2014   CORONARY ANGIOPLASTY     CORONARY ANGIOPLASTY WITH STENT PLACEMENT  2001   "1" (06/27/2012)   LOOP RECORDER IMPLANT  12-15-13   MDT LinQ implanted by Dr Johney Frame for cryptogenic stroke   LOOP RECORDER IMPLANT N/A 12/15/2013   Procedure: LOOP RECORDER IMPLANT;  Surgeon: Gardiner Rhyme, MD;  Location: [facility] CATH LAB;  Service: Cardiovascular;  Laterality: N/A;   LOOP RECORDER REMOVAL N/A 03/07/2021   Procedure: LOOP RECORDER REMOVAL;  Surgeon: Lanier Prude, MD;  Location: [facility] INVASIVE CV LAB;  Service: Cardiovascular;  Laterality: N/A;   MM CORP SCREENING MAMMO BIL/CAD ([facility] HX)     PACEMAKER IMPLANT N/A 03/07/2021   Procedure: PACEMAKER IMPLANT;  Surgeon: Lanier Prude, MD;  Location: [facility] INVASIVE CV LAB;  Service: Cardiovascular;  Laterality: N/A;   PARTIAL KNEE ARTHROPLASTY  06/27/2012   Procedure: UNICOMPARTMENTAL KNEE;  Surgeon: Nilda Simmer, MD;  Location: [facility] OR;  Service: Orthopedics;  Laterality: Left;  left unicompartmental knee   SHOULDER ARTHROSCOPY WITH ROTATOR CUFF REPAIR AND SUBACROMIAL DECOMPRESSION Left 2005   (06/27/2012)   stent placement N/A    TEE WITHOUT CARDIOVERSION N/A 01/03/2014   Procedure: TRANSESOPHAGEAL ECHOCARDIOGRAM (TEE);  Surgeon:  Thurmon Fair, MD;  Location: [facility] ENDOSCOPY;  Service: Cardiovascular;  Laterality: N/A;    SOCIAL HISTORY: Social History   Socioeconomic History   Marital status: Married    Spouse name: Not on file   Number of children: 2   Years of education: Not on file   Highest education level: Not on file  Occupational History   Occupation: retired  Tobacco Use   Smoking status: Never   Smokeless tobacco: Never  Vaping Use   Vaping status: Never Used  Substance and Sexual Activity   Alcohol use: No    Alcohol/week: 0.0 standard drinks of alcohol   Drug use: No   Sexual activity: Yes  Other Topics Concern   Not on file  Social History Narrative   Married and retired, 2 children   Stays busy keeping up his [place] home plus he has a home in [place] and takes care of his mother-in-law's home   No alcohol tobacco or drug use   Social Drivers of Corporate investment banker Strain: Not on file  Food Insecurity: No Food Insecurity (12/15/2022)   Hunger Vital Sign    Worried About Running Out of Food in the Last Year: Never true    Ran Out of Food in the Last Year: Never true  Transportation Needs: No Transportation Needs (12/15/2022)   PRAPARE - Administrator, Civil Service (Medical): No    Lack of Transportation (Non-Medical): No  Physical Activity: Insufficiently Active (02/08/2019)   Received from [facility] visits prior to 09/26/2022., [facility] visits prior to 09/26/2022.   Exercise Vital Sign    Days of Exercise per Week: 3 days    Minutes of Exercise per Session: 30 min  Stress: Not on file  Social Connections: Not on file  Intimate Partner Violence: Patient Unable To Answer (12/15/2022)   Humiliation, Afraid, Rape, and Kick questionnaire    Fear of Current or Ex-Partner: Patient unable to answer    Emotionally Abused: Patient unable to answer    Physically Abused: Patient unable to answer    Sexually  Abused: Patient unable to answer    FAMILY HISTORY: Family History  Problem Relation Age of Onset   Heart attack Mother    Heart disease Brother    Liver cancer Brother        deceased   Cancer - Lung Brother        deceased   Esophageal cancer Brother     ALLERGIES:  has no known allergies.  MEDICATIONS:  Current Outpatient Medications  Medication Sig Dispense Refill   acetaminophen (TYLENOL) 500 MG tablet Take 1,000 mg by mouth every 6 (six) hours as needed for mild pain (For pain in hands).     amLODipine (NORVASC) 5 MG tablet Take 1 tablet (5 mg total) by mouth daily. 100 tablet 3   chlorthalidone (HYGROTON) 25 MG tablet Take 1 tablet (25 mg total) by mouth daily. 100  tablet 3   Cyanocobalamin (B-12) 5000 MCG CAPS Take 5,000 mcg by mouth daily.     esomeprazole (NEXIUM) 40 MG capsule Take 40 mg by mouth daily at 6 (six) AM.     JARDIANCE 10 MG TABS tablet Take 10 mg by mouth daily.     levothyroxine (SYNTHROID, LEVOTHROID) 175 MCG tablet Take 175 mcg by mouth daily before breakfast.     losartan (COZAAR) 100 MG tablet Take 1 tablet (100 mg total) by mouth daily. 100 tablet 3   Omega-3 Fatty Acids ([employer] FISH OIL) 1000 MG CAPS Take 1,000 mg by mouth daily.     OZEMPIC, 0.25 OR 0.5 MG/DOSE, 2 MG/3ML SOPN INJECT 1/4 (ONE-FOURTH) MG SUBCUTANEOUSLY ONCE A WEEK FOR 2 WEEKS THEN INCREASE TO 1/2 (ONE-HALF) MG ONCE A WEEK THEREAFTER     pregabalin (LYRICA) 150 MG capsule Take 150 mg by mouth 2 (two) times daily.     rivaroxaban (XARELTO) 20 MG TABS tablet Take 1 tablet (20 mg total) by mouth daily with supper. 100 tablet 3   rosuvastatin (CRESTOR) 20 MG tablet Take 1 tablet (20 mg total) by mouth at bedtime. 100 tablet 3   tamsulosin (FLOMAX) 0.4 MG CAPS capsule Take 0.4 mg by mouth in the morning and at bedtime.     No current facility-administered medications for this visit.    REVIEW OF SYSTEMS:   Constitutional: ( - ) fevers, ( - )  chills , ( - ) night sweats Eyes: ( - )  blurriness of vision, ( - ) double vision, ( - ) watery eyes Ears, nose, mouth, throat, and face: ( - ) mucositis, ( - ) sore throat Respiratory: ( - ) cough, ( - ) dyspnea, ( - ) wheezes Cardiovascular: ( - ) palpitation, ( - ) chest discomfort, ( - ) lower extremity swelling Gastrointestinal:  ( - ) nausea, ( - ) heartburn, ( - ) change in bowel habits Skin: ( - ) abnormal skin rashes Lymphatics: ( - ) new lymphadenopathy, ( - ) easy bruising Neurological: ( - ) numbness, ( - ) tingling, ( - ) new weaknesses Behavioral/Psych: ( - ) mood change, ( - ) new changes  All other systems were reviewed with the patient and are negative.  PHYSICAL EXAMINATION: Vitals:   09/15/23 1003  BP: 122/76  Pulse: 80  Resp: 14  Temp: (!) 97.2 F (36.2 C)  SpO2: 97%    Filed Weights   09/15/23 1003  Weight: 238 lb 3.2 oz (108 kg)    GENERAL: Well-appearing elderly Caucasian male, alert, no distress and comfortable SKIN: skin color, texture, turgor are normal, no rashes or significant lesions EYES: conjunctiva are pink and non-injected, sclera clear LUNGS: clear to auscultation and percussion with normal breathing effort HEART: regular rate & rhythm and no murmurs and no lower extremity edema Musculoskeletal: no cyanosis of digits and no clubbing  PSYCH: alert & oriented x 3, fluent speech NEURO: no focal motor/sensory deficits  LABORATORY DATA:  I have reviewed the data as listed    Latest Ref Rng & Units 09/15/2023    9:32 AM 03/17/2023   10:16 AM 12/15/2022    1:49 PM  CBC  WBC 4.0 - 10.5 K/uL 8.8  8.3  7.8   Hemoglobin 13.0 - 17.0 g/dL 16.1  09.6  04.5   Hematocrit 39.0 - 52.0 % 46.1  45.4  43.4   Platelets 150 - 400 K/uL 151  139  143  Latest Ref Rng & Units 09/15/2023    9:32 AM 03/17/2023   10:16 AM 12/15/2022    1:49 PM  CMP  Glucose 70 - 99 mg/dL 67  161  096   BUN 8 - 23 mg/dL 18  21  18    Creatinine 0.61 - 1.24 mg/dL 0.45  4.09  8.11   Sodium 135 - 145 mmol/L 137   136  136   Potassium 3.5 - 5.1 mmol/L 3.9  3.9  3.5   Chloride 98 - 111 mmol/L 98  99  100   CO2 22 - 32 mmol/L 32  31  29   Calcium 8.9 - 10.3 mg/dL 91.4  9.5  9.5   Total Protein 6.5 - 8.1 g/dL 6.8  7.1  6.7   Total Bilirubin 0.0 - 1.2 mg/dL 1.0  1.3  1.3   Alkaline Phos 38 - 126 U/L 49  54  50   AST 15 - 41 U/L 17  19  19    ALT 0 - 44 U/L 17  20  19     RADIOGRAPHIC STUDIES: CUP PACEART REMOTE DEVICE CHECK Result Date: 09/08/2023 Scheduled remote reviewed. Normal device function.  Presenting AFL, known history, on OAC per previous report.  Current AF burden 73.6%. Routing to triage for persistent AF, increased burden from previous review period. Next remote 91 days. - CS, CVRS   ASSESSMENT & PLAN ALECK LOCKLIN 78 y.o. male with medical history significant for SLL/CLL who presents for a follow up visit.   After review of the labs, review of the records, and discussion with the patient the patients findings are most consistent with CLL/SLL.    Previously we discussed the diagnosis of CLL and what to expect moving forward.  We discussed that this tends to be a more indolent form of leukemia/lymphoma and may not require treatment initially.  PET CT scan has confirmed that the patient has at least stage I disease, but we will order CBC in order to confirm that he is not stage III or IV.  There does not be appear to be any splenic involvement.  The patient voices understanding of the findings, prognosis, steps moving forward for his diagnosis.   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  Patient is at least a stage I. -- prognostic panel showed the primary mutation is deletion 13.  IGHV was not detected and ZAP70 was positive. -- labs today show white blood cell 8.8, Hgb 15.3, MCV 97.7, Plt 151. Cr 0.96. LFTS WNL.  -- no indication for routine images unless patient were to develop new  symptoms or changes in blood counts.  -- Plan to have the patient return to clinic in 6 month's time.     No orders of the defined types were placed in this encounter.   All questions were answered. The patient knows to call the clinic with any problems, questions or concerns.  A total of more than 25 minutes were spent on this encounter with face-to-face time and non-face-to-face time, including preparing to see the patient, ordering tests and/or medications, counseling the patient and coordination of care as outlined above.   Ulysees Barns, MD Department of Hematology/Oncology [facility] at [facility] Phone: [phone] Pager: [phone] Email: [email or web address]  09/15/2023 11:14 AM

## 2023-09-14 DIAGNOSIS — G4733 Obstructive sleep apnea (adult) (pediatric): Secondary | ICD-10-CM | POA: Diagnosis not present

## 2023-09-14 DIAGNOSIS — G629 Polyneuropathy, unspecified: Secondary | ICD-10-CM | POA: Diagnosis not present

## 2023-09-14 DIAGNOSIS — D6869 Other thrombophilia: Secondary | ICD-10-CM | POA: Diagnosis not present

## 2023-09-14 DIAGNOSIS — I442 Atrioventricular block, complete: Secondary | ICD-10-CM | POA: Diagnosis not present

## 2023-09-14 DIAGNOSIS — E669 Obesity, unspecified: Secondary | ICD-10-CM | POA: Diagnosis not present

## 2023-09-14 DIAGNOSIS — C61 Malignant neoplasm of prostate: Secondary | ICD-10-CM | POA: Diagnosis not present

## 2023-09-14 DIAGNOSIS — I2510 Atherosclerotic heart disease of native coronary artery without angina pectoris: Secondary | ICD-10-CM | POA: Diagnosis not present

## 2023-09-14 DIAGNOSIS — C9110 Chronic lymphocytic leukemia of B-cell type not having achieved remission: Secondary | ICD-10-CM | POA: Diagnosis not present

## 2023-09-14 DIAGNOSIS — D696 Thrombocytopenia, unspecified: Secondary | ICD-10-CM | POA: Diagnosis not present

## 2023-09-14 DIAGNOSIS — E1140 Type 2 diabetes mellitus with diabetic neuropathy, unspecified: Secondary | ICD-10-CM | POA: Diagnosis not present

## 2023-09-14 DIAGNOSIS — I480 Paroxysmal atrial fibrillation: Secondary | ICD-10-CM | POA: Diagnosis not present

## 2023-09-14 DIAGNOSIS — I10 Essential (primary) hypertension: Secondary | ICD-10-CM | POA: Diagnosis not present

## 2023-09-14 DIAGNOSIS — Z6832 Body mass index (BMI) 32.0-32.9, adult: Secondary | ICD-10-CM | POA: Diagnosis not present

## 2023-09-15 ENCOUNTER — Inpatient Hospital Stay: Payer: PPO | Admitting: Hematology and Oncology

## 2023-09-15 ENCOUNTER — Other Ambulatory Visit: Payer: Medicare Other

## 2023-09-15 ENCOUNTER — Ambulatory Visit: Payer: Medicare Other | Admitting: Hematology and Oncology

## 2023-09-15 ENCOUNTER — Inpatient Hospital Stay: Payer: PPO | Attending: Hematology and Oncology

## 2023-09-15 VITALS — BP 122/76 | HR 80 | Temp 97.2°F | Resp 14 | Wt 238.2 lb

## 2023-09-15 DIAGNOSIS — C9110 Chronic lymphocytic leukemia of B-cell type not having achieved remission: Secondary | ICD-10-CM | POA: Diagnosis not present

## 2023-09-15 DIAGNOSIS — Z7901 Long term (current) use of anticoagulants: Secondary | ICD-10-CM | POA: Insufficient documentation

## 2023-09-15 DIAGNOSIS — Z79899 Other long term (current) drug therapy: Secondary | ICD-10-CM | POA: Insufficient documentation

## 2023-09-15 DIAGNOSIS — G4733 Obstructive sleep apnea (adult) (pediatric): Secondary | ICD-10-CM | POA: Diagnosis not present

## 2023-09-15 DIAGNOSIS — Z801 Family history of malignant neoplasm of trachea, bronchus and lung: Secondary | ICD-10-CM | POA: Insufficient documentation

## 2023-09-15 LAB — CMP (CANCER CENTER ONLY)
ALT: 17 U/L (ref 0–44)
AST: 17 U/L (ref 15–41)
Albumin: 4.7 g/dL (ref 3.5–5.0)
Alkaline Phosphatase: 49 U/L (ref 38–126)
Anion gap: 7 (ref 5–15)
BUN: 18 mg/dL (ref 8–23)
CO2: 32 mmol/L (ref 22–32)
Calcium: 10 mg/dL (ref 8.9–10.3)
Chloride: 98 mmol/L (ref 98–111)
Creatinine: 0.96 mg/dL (ref 0.61–1.24)
GFR, Estimated: >60 mL/min (ref >60)
Glucose, Bld: 67 mg/dL — ABNORMAL LOW (ref 70–99)
Potassium: 3.9 mmol/L (ref 3.5–5.1)
Sodium: 137 mmol/L (ref 135–145)
Total Bilirubin: 1 mg/dL (ref 0.0–1.2)
Total Protein: 6.8 g/dL (ref 6.5–8.1)

## 2023-09-15 LAB — CBC WITH DIFFERENTIAL (CANCER CENTER ONLY)
Abs Immature Granulocytes: 0.03 10*3/uL (ref 0.00–0.07)
Basophils Absolute: 0 10*3/uL (ref 0.0–0.1)
Basophils Relative: 0 %
Eosinophils Absolute: 0.1 10*3/uL (ref 0.0–0.5)
Eosinophils Relative: 1 %
HCT: 46.1 % (ref 39.0–52.0)
Hemoglobin: 15.3 g/dL (ref 13.0–17.0)
Immature Granulocytes: 0 %
Lymphocytes Relative: 55 %
Lymphs Abs: 4.8 10*3/uL — ABNORMAL HIGH (ref 0.7–4.0)
MCH: 32.4 pg (ref 26.0–34.0)
MCHC: 33.2 g/dL (ref 30.0–36.0)
MCV: 97.7 fL (ref 80.0–100.0)
Monocytes Absolute: 0.8 10*3/uL (ref 0.1–1.0)
Monocytes Relative: 9 %
Neutro Abs: 3.1 10*3/uL (ref 1.7–7.7)
Neutrophils Relative %: 35 %
Platelet Count: 151 10*3/uL (ref 150–400)
RBC: 4.72 MIL/uL (ref 4.22–5.81)
RDW: 13.3 % (ref 11.5–15.5)
WBC Count: 8.8 10*3/uL (ref 4.0–10.5)
nRBC: 0 % (ref 0.0–0.2)

## 2023-09-15 LAB — LACTATE DEHYDROGENASE: LDH: 155 U/L (ref 98–192)

## 2023-09-20 DIAGNOSIS — C61 Malignant neoplasm of prostate: Secondary | ICD-10-CM | POA: Diagnosis not present

## 2023-09-20 DIAGNOSIS — R351 Nocturia: Secondary | ICD-10-CM | POA: Diagnosis not present

## 2023-09-20 DIAGNOSIS — N401 Enlarged prostate with lower urinary tract symptoms: Secondary | ICD-10-CM | POA: Diagnosis not present

## 2023-09-30 NOTE — H&P (View-Only) (Signed)
 Electrophysiology Office Note:   Date:  10/01/2023  ID:  Brandon Soto, DOB 07-03-1946, MRN [id number]  Primary Cardiologist: Nanetta Batty, MD Primary Heart Failure: None Electrophysiologist: Lanier Prude, MD       History of Present Illness:   Brandon Soto is a 78 y.o. male with h/o CHB s/p PPM, AF, HTN, HLD, CAD s/p LAD PCI, hypothyroidism, CVA, OSA on CPAP seen today for routine electrophysiology followup.   Since last being seen in our clinic the patient reports he has noted he has been tired in the last few months which is new for him.  He walked around his yard and he had new shortness of breath. He has noted an odd sensation in his mid chest at times but it is not pain. No LE swelling.   He denies chest pain, palpitations, PND, orthopnea, nausea, vomiting, dizziness, syncope, edema, weight gain, or early satiety.   Review of systems complete and found to be negative unless listed in HPI.   EP Information / Studies Reviewed:    EKG is not ordered today. EKG from 08/02/2023 reviewed which showed AF, VP 56 bpm with PVC's      PPM Interrogation-  reviewed in detail today,  See PACEART report.  Device History: Medtronic Dual Chamber PPM implanted 03/07/2021 for CHB  Studies:  ECHO 12/2020 > LVEF 60-65%, LA moderately dilated, RA mild-mod dilated    Arrhythmia / AAD Paroxysmal AF   Risk Assessment/Calculations:    CHA2DS2-VASc Score = 4   This indicates a 4.8% annual risk of stroke. The patient's score is based upon: CHF History: 0 HTN History: 1 Diabetes History: 0 Stroke History: 0 Vascular Disease History: 1 Age Score: 2 Gender Score: 0             Physical Exam:   VS:  BP 126/74 (BP Location: Right Arm, Patient Position: Sitting, Cuff Size: Normal)   Pulse 75   Ht 6\' 1"  (1.854 m)   Wt 237 lb 9.6 oz (107.8 kg)   SpO2 96%   BMI 31.35 kg/m    Wt Readings from Last 3 Encounters:  10/01/23 237 lb 9.6 oz (107.8 kg)  09/15/23 238 lb 3.2 oz (108 kg)   08/02/23 240 lb (108.9 kg)     GEN: Well nourished, well developed in no acute distress NECK: No JVD; No carotid bruits CARDIAC: Regular rate and rhythm (VP), no murmurs, rubs, gallops RESPIRATORY:  Clear to auscultation without rales, wheezing or rhonchi  ABDOMEN: Soft, non-tender, non-distended EXTREMITIES:  No edema; No deformity   ASSESSMENT AND PLAN:    CHB s/p Medtronic PPM  -Normal PPM function -See Pace Art report -No changes today  Paroxysmal AF  AF/AFL by EGM 10/01/23  CHA2DS2-VASc 4. New persistent episode ongoing since mid December 2024. Pt with symptoms of fatigue, sensation in his chest and SOB with exertion  -16.5% on device  -OAC for stroke prophylaxis -plan for DCCV given patient symptoms, no missed doses of OAC  Secondary Hypercoagulable State  -continue Xarelto 20 mg, dose reviewed and appropriate by 97 mL/min  Hypertension  -well controlled on current regimen    CAD -no anginal symptoms   OSA  -CPAP compliance encouraged    Informed Consent   Shared Decision Making/Informed Consent The risks (stroke, cardiac arrhythmias rarely resulting in the need for a temporary or permanent pacemaker, skin irritation or burns and complications associated with conscious sedation including aspiration, arrhythmia, respiratory failure and death), benefits (restoration of normal sinus  rhythm) and alternatives of a direct current cardioversion were explained in detail to Mr. Brandon Soto and he agrees to proceed.         Disposition:   Follow up with EP APP in 4 weeks post DCCV  Signed, Canary Brim, NP-C, AGACNP-BC [facility] - Electrophysiology  10/01/2023, 12:25 PM

## 2023-09-30 NOTE — Progress Notes (Signed)
 Electrophysiology Office Note:   Date:  10/01/2023  ID:  Brandon Soto, DOB 07-03-1946, MRN [id number]  Primary Cardiologist: Nanetta Batty, MD Primary Heart Failure: None Electrophysiologist: Lanier Prude, MD       History of Present Illness:   Brandon Soto is a 78 y.o. male with h/o CHB s/p PPM, AF, HTN, HLD, CAD s/p LAD PCI, hypothyroidism, CVA, OSA on CPAP seen today for routine electrophysiology followup.   Since last being seen in our clinic the patient reports he has noted he has been tired in the last few months which is new for him.  He walked around his yard and he had new shortness of breath. He has noted an odd sensation in his mid chest at times but it is not pain. No LE swelling.   He denies chest pain, palpitations, PND, orthopnea, nausea, vomiting, dizziness, syncope, edema, weight gain, or early satiety.   Review of systems complete and found to be negative unless listed in HPI.   EP Information / Studies Reviewed:    EKG is not ordered today. EKG from 08/02/2023 reviewed which showed AF, VP 56 bpm with PVC's      PPM Interrogation-  reviewed in detail today,  See PACEART report.  Device History: Medtronic Dual Chamber PPM implanted 03/07/2021 for CHB  Studies:  ECHO 12/2020 > LVEF 60-65%, LA moderately dilated, RA mild-mod dilated    Arrhythmia / AAD Paroxysmal AF   Risk Assessment/Calculations:    CHA2DS2-VASc Score = 4   This indicates a 4.8% annual risk of stroke. The patient's score is based upon: CHF History: 0 HTN History: 1 Diabetes History: 0 Stroke History: 0 Vascular Disease History: 1 Age Score: 2 Gender Score: 0             Physical Exam:   VS:  BP 126/74 (BP Location: Right Arm, Patient Position: Sitting, Cuff Size: Normal)   Pulse 75   Ht 6\' 1"  (1.854 m)   Wt 237 lb 9.6 oz (107.8 kg)   SpO2 96%   BMI 31.35 kg/m    Wt Readings from Last 3 Encounters:  10/01/23 237 lb 9.6 oz (107.8 kg)  09/15/23 238 lb 3.2 oz (108 kg)   08/02/23 240 lb (108.9 kg)     GEN: Well nourished, well developed in no acute distress NECK: No JVD; No carotid bruits CARDIAC: Regular rate and rhythm (VP), no murmurs, rubs, gallops RESPIRATORY:  Clear to auscultation without rales, wheezing or rhonchi  ABDOMEN: Soft, non-tender, non-distended EXTREMITIES:  No edema; No deformity   ASSESSMENT AND PLAN:    CHB s/p Medtronic PPM  -Normal PPM function -See Pace Art report -No changes today  Paroxysmal AF  AF/AFL by EGM 10/01/23  CHA2DS2-VASc 4. New persistent episode ongoing since mid December 2024. Pt with symptoms of fatigue, sensation in his chest and SOB with exertion  -16.5% on device  -OAC for stroke prophylaxis -plan for DCCV given patient symptoms, no missed doses of OAC  Secondary Hypercoagulable State  -continue Xarelto 20 mg, dose reviewed and appropriate by 97 mL/min  Hypertension  -well controlled on current regimen    CAD -no anginal symptoms   OSA  -CPAP compliance encouraged    Informed Consent   Shared Decision Making/Informed Consent The risks (stroke, cardiac arrhythmias rarely resulting in the need for a temporary or permanent pacemaker, skin irritation or burns and complications associated with conscious sedation including aspiration, arrhythmia, respiratory failure and death), benefits (restoration of normal sinus  rhythm) and alternatives of a direct current cardioversion were explained in detail to Brandon Soto and he agrees to proceed.         Disposition:   Follow up with EP APP in 4 weeks post DCCV  Signed, Canary Brim, NP-C, AGACNP-BC [facility] - Electrophysiology  10/01/2023, 12:25 PM

## 2023-10-01 ENCOUNTER — Encounter: Payer: Self-pay | Admitting: Pulmonary Disease

## 2023-10-01 ENCOUNTER — Ambulatory Visit: Payer: PPO | Attending: Pulmonary Disease | Admitting: Pulmonary Disease

## 2023-10-01 VITALS — BP 126/74 | HR 75 | Ht 73.0 in | Wt 237.6 lb

## 2023-10-01 DIAGNOSIS — I10 Essential (primary) hypertension: Secondary | ICD-10-CM

## 2023-10-01 DIAGNOSIS — I480 Paroxysmal atrial fibrillation: Secondary | ICD-10-CM

## 2023-10-01 DIAGNOSIS — G4733 Obstructive sleep apnea (adult) (pediatric): Secondary | ICD-10-CM | POA: Diagnosis not present

## 2023-10-01 DIAGNOSIS — Z01812 Encounter for preprocedural laboratory examination: Secondary | ICD-10-CM

## 2023-10-01 DIAGNOSIS — Z950 Presence of cardiac pacemaker: Secondary | ICD-10-CM

## 2023-10-01 DIAGNOSIS — I442 Atrioventricular block, complete: Secondary | ICD-10-CM

## 2023-10-01 LAB — CUP PACEART INCLINIC DEVICE CHECK
Battery Remaining Longevity: 124 mo
Battery Voltage: 3.01 V
Brady Statistic AP VP Percent: 6.33 %
Brady Statistic AP VS Percent: 0 %
Brady Statistic AS VP Percent: 92.65 %
Brady Statistic AS VS Percent: 0.95 %
Brady Statistic RA Percent Paced: 5.71 %
Brady Statistic RV Percent Paced: 98.93 %
Date Time Interrogation Session: 20250307122130
Implantable Lead Connection Status: 753985
Implantable Lead Connection Status: 753985
Implantable Lead Implant Date: 20220812
Implantable Lead Implant Date: 20220812
Implantable Lead Location: 753859
Implantable Lead Location: 753860
Implantable Lead Model: 3830
Implantable Lead Model: 5076
Implantable Pulse Generator Implant Date: 20220812
Lead Channel Impedance Value: 285 Ohm
Lead Channel Impedance Value: 380 Ohm
Lead Channel Impedance Value: 456 Ohm
Lead Channel Impedance Value: 684 Ohm
Lead Channel Pacing Threshold Amplitude: 1 V
Lead Channel Pacing Threshold Amplitude: 1.125 V
Lead Channel Pacing Threshold Pulse Width: 0.4 ms
Lead Channel Pacing Threshold Pulse Width: 0.4 ms
Lead Channel Sensing Intrinsic Amplitude: 10 mV
Lead Channel Sensing Intrinsic Amplitude: 10 mV
Lead Channel Sensing Intrinsic Amplitude: 2.75 mV
Lead Channel Sensing Intrinsic Amplitude: 3.125 mV
Lead Channel Setting Pacing Amplitude: 2.25 V
Lead Channel Setting Pacing Amplitude: 2.25 V
Lead Channel Setting Pacing Pulse Width: 0.4 ms
Lead Channel Setting Sensing Sensitivity: 1.2 mV
Zone Setting Status: 755011

## 2023-10-01 NOTE — Patient Instructions (Signed)
 Medication Instructions:  The current medical regimen is effective;  continue present plan and medications.  *If you need a refill on your cardiac medications before your next appointment, please call your pharmacy*   Lab Work: Please have blood work today (CBC, BMP)  If you have labs (blood work) drawn today and your tests are completely normal, you will receive your results only by: MyChart Message (if you have MyChart) OR A paper copy in the mail If you have any lab test that is abnormal or we need to change your treatment, we will call you to review the results.  Testing/Procedures: Your physician has requested that you have a Cardioversion. Electrical Cardioversion uses a jolt of electricity to your heart either through paddles or wired patches attached to your chest. This is a controlled, usually prescheduled, procedure. This procedure is done at the hospital and you are not awake during the procedure. You usually go home the day of the procedure. Please see the instruction sheet given to you today for more information.      Dear Brandon Soto  You are scheduled for a Cardioversion on Friday, March 14 with Dr. Bjorn Pippin.  Please arrive at the [facility] (Main Entrance A) at [facility]: [address] [place], [place] [address] at 8:30 AM (This time is 1 hour(s) before your procedure to ensure your preparation).   Free valet parking service is available. You will check in at ADMITTING.   *Please Note: You will receive a call the day before your procedure to confirm the appointment time. That time may have changed from the original time based on the schedule for that day.*    DIET:  Nothing to eat or drink after midnight except a sip of water with medications (see medication instructions below)  MEDICATION INSTRUCTIONS: !!IF ANY NEW MEDICATIONS ARE STARTED AFTER TODAY, PLEASE NOTIFY YOUR PROVIDER AS SOON AS POSSIBLE!!  FYI: Medications such as Semaglutide (Ozempic, [place]),  Tirzepatide (Mounjaro, Zepbound), Dulaglutide (Trulicity), etc ("GLP1 agonists") AND Canagliflozin (Invokana), Dapagliflozin (Farxiga), Empagliflozin (Jardiance), Ertugliflozin (Steglatro), Bexagliflozin [employer]) or any combination with one of these drugs such as Invokamet (Canagliflozin/Metformin), Synjardy (Empagliflozin/Metformin), etc ("SGLT2 inhibitors") must be held around the time of a procedure. This is not a comprehensive list of all of these drugs. Please review all of your medications and talk to your provider if you take any one of these. If you are not sure, ask your provider.  HOLD: Semaglutide (Ozempic, Rybelsus, Wegovy) for 1 week prior to the procedure. Do not take Ozempic again before your cardioversion.  HOLD: Empagliflozin (Jardiance) for 3 days prior to the procedure. Last dose on Tuesday, March 11.  Continue taking your anticoagulant (blood thinner): Rivaroxaban (Xarelto).  You will need to continue this after your procedure until you are told by your provider that it is safe to stop.    LABS:   Today - CBC, BMP   FYI:  For your safety, and to allow [place] to monitor your vital signs accurately during the surgery/procedure we request: If you have artificial nails, gel coating, SNS etc, please have those removed prior to your surgery/procedure. Not having the nail coverings /polish removed may result in cancellation or delay of your surgery/procedure.  Your support person will be asked to wait in the waiting room during your procedure.  It is OK to have someone drop you off and come back when you are ready to be discharged.  You cannot drive after the procedure and will need someone  to drive you home.  Bring your insurance cards.  *Special Note: Every effort is made to have your procedure done on time. Occasionally there are emergencies that occur at the hospital that may cause delays. Please be patient if a delay does occur.    Follow-Up: At [facility], you and  your health needs are our priority.  As part of our continuing mission to provide you with exceptional heart care, we have created designated Provider Care Teams.  These Care Teams include your primary Cardiologist (physician) and Advanced Practice Providers (APPs -  Physician Assistants and Nurse Practitioners) who all work together to provide you with the care you need, when you need it.  We recommend signing up for the patient portal called "MyChart".  Sign up information is provided on this After Visit Summary.  MyChart is used to connect with patients for Virtual Visits (Telemedicine).  Patients are able to view lab/test results, encounter notes, upcoming appointments, etc.  Non-urgent messages can be sent to your provider as well.   To learn more about what you can do with MyChart, go to [email or web address].    Your next appointment:   1 month(s)  Provider:   Canary Brim, NP

## 2023-10-02 LAB — CBC
Hematocrit: 45.8 % (ref 37.5–51.0)
Hemoglobin: 15.1 g/dL (ref 13.0–17.7)
MCH: 32.3 pg (ref 26.6–33.0)
MCHC: 33 g/dL (ref 31.5–35.7)
MCV: 98 fL — ABNORMAL HIGH (ref 79–97)
Platelets: 152 10*3/uL (ref 150–450)
RBC: 4.67 x10E6/uL (ref 4.14–5.80)
RDW: 13.3 % (ref 11.6–15.4)
WBC: 9.3 10*3/uL (ref 3.4–10.8)

## 2023-10-02 LAB — BASIC METABOLIC PANEL
BUN/Creatinine Ratio: 16 (ref 10–24)
BUN: 17 mg/dL (ref 8–27)
CO2: 23 mmol/L (ref 20–29)
Calcium: 9.8 mg/dL (ref 8.6–10.2)
Chloride: 95 mmol/L — ABNORMAL LOW (ref 96–106)
Creatinine, Ser: 1.07 mg/dL (ref 0.76–1.27)
Glucose: 96 mg/dL (ref 70–99)
Potassium: 4 mmol/L (ref 3.5–5.2)
Sodium: 134 mmol/L (ref 134–144)
eGFR: 71 mL/min/{1.73_m2} (ref >59)

## 2023-10-07 ENCOUNTER — Encounter: Payer: Self-pay | Admitting: Cardiology

## 2023-10-07 NOTE — Progress Notes (Signed)
 Spoke to patient and instructed them to come at 0800  and to be NPO after 0000.  Medications reviewed.    Confirmed that patient will have a ride home and someone to stay with them for 24 hours after the procedure.

## 2023-10-08 ENCOUNTER — Ambulatory Visit (HOSPITAL_COMMUNITY): Admitting: Anesthesiology

## 2023-10-08 ENCOUNTER — Ambulatory Visit (HOSPITAL_COMMUNITY)
Admission: RE | Admit: 2023-10-08 | Discharge: 2023-10-08 | Disposition: A | Discharge status: Home / Self Care | Attending: Cardiology | Admitting: Cardiology

## 2023-10-08 ENCOUNTER — Encounter (HOSPITAL_COMMUNITY)
Admission: RE | Disposition: A | Discharge status: Home / Self Care | Payer: Self-pay | Source: Home / Self Care | Attending: Cardiology

## 2023-10-08 ENCOUNTER — Other Ambulatory Visit: Payer: Self-pay

## 2023-10-08 DIAGNOSIS — I4891 Unspecified atrial fibrillation: Secondary | ICD-10-CM

## 2023-10-08 DIAGNOSIS — D6869 Other thrombophilia: Secondary | ICD-10-CM | POA: Diagnosis not present

## 2023-10-08 DIAGNOSIS — G4733 Obstructive sleep apnea (adult) (pediatric): Secondary | ICD-10-CM | POA: Diagnosis not present

## 2023-10-08 DIAGNOSIS — I2510 Atherosclerotic heart disease of native coronary artery without angina pectoris: Secondary | ICD-10-CM | POA: Insufficient documentation

## 2023-10-08 DIAGNOSIS — I10 Essential (primary) hypertension: Secondary | ICD-10-CM | POA: Insufficient documentation

## 2023-10-08 DIAGNOSIS — I480 Paroxysmal atrial fibrillation: Secondary | ICD-10-CM | POA: Diagnosis not present

## 2023-10-08 DIAGNOSIS — I4819 Other persistent atrial fibrillation: Secondary | ICD-10-CM | POA: Diagnosis not present

## 2023-10-08 DIAGNOSIS — Z7901 Long term (current) use of anticoagulants: Secondary | ICD-10-CM | POA: Insufficient documentation

## 2023-10-08 SURGERY — CARDIOVERSION (CATH LAB)
Anesthesia: Regional

## 2023-10-08 MED ORDER — SODIUM CHLORIDE 0.9% FLUSH: 3.0000 mL | INTRAVENOUS | Status: DC | PRN

## 2023-10-08 MED ORDER — SODIUM CHLORIDE 0.9% FLUSH: 3.0000 mL | Freq: Two times a day (BID) | INTRAVENOUS | Status: DC

## 2023-10-08 MED ORDER — LIDOCAINE 2% (20 MG/ML) 5 ML SYRINGE
INTRAMUSCULAR | Status: DC | PRN
  Administered 2023-10-08: 40 mg via INTRAVENOUS

## 2023-10-08 MED ORDER — PROPOFOL 10 MG/ML IV BOLUS
INTRAVENOUS | Status: DC | PRN
  Administered 2023-10-08: 50 mg via INTRAVENOUS
  Administered 2023-10-08: 20 mg via INTRAVENOUS

## 2023-10-08 SURGICAL SUPPLY — 1 items: PAD DEFIB RADIO PHYSIO CONN (PAD) ×1 IMPLANT

## 2023-10-08 NOTE — CV Procedure (Signed)
 Procedure:   DCCV  Indication:  Symptomatic atrial fibrillation  Procedure Note:  The patient signed informed consent.  They have had had therapeutic anticoagulation with Xarelto greater than 3 weeks.  Anesthesia was administered by Dr. Maple Hudson and Beola Cord, CRNA.  Adequate airway was maintained throughout and vital followed per protocol.  They were cardioverted x 1 with 200J of biphasic synchronized energy.  They converted to A-paced V-paced rhythm with rate 80, confirmed on device interrogation.  There were no apparent complications.  The patient had normal neuro status and respiratory status post procedure with vitals stable as recorded elsewhere.    Follow up:  They will continue on current medical therapy and follow up with cardiology as scheduled.  Epifanio Lesches, MD 10/08/2023 9:20 AM

## 2023-10-08 NOTE — Interval H&P Note (Signed)
 History and Physical Interval Note:  10/08/2023 9:02 AM  Brandon Soto  has presented today for surgery, with the diagnosis of AFIB.  The various methods of treatment have been discussed with the patient and family. After consideration of risks, benefits and other options for treatment, the patient has consented to  Procedure(s) with comments: CARDIOVERSION (N/A) - [id number] as a surgical intervention.  The patient's history has been reviewed, patient examined, no change in status, stable for surgery.  I have reviewed the patient's chart and labs.  Questions were answered to the patient's satisfaction.     [name]

## 2023-10-08 NOTE — Transfer of Care (Signed)
 Immediate Anesthesia Transfer of Care Note  Patient: Brandon Soto  Procedure(s) Performed: CARDIOVERSION  Patient Location: PACU  Anesthesia Type:MAC  Level of Consciousness: awake, drowsy, and patient cooperative  Airway & Oxygen Therapy: Patient Spontanous Breathing and Patient connected to nasal cannula oxygen  Post-op Assessment: Report given to RN and Post -op Vital signs reviewed and stable  Post vital signs: Reviewed and stable  Last Vitals:  Vitals Value Taken Time  BP 109/71 10/08/23 0922  Temp 98.7 10/08/23 0922  Pulse 57 10/08/23 0922  Resp 23 10/08/23 0922  SpO2 95 % 10/08/23 0922  Vitals shown include unfiled device data.      Complications: No notable events documented.

## 2023-10-11 ENCOUNTER — Encounter (HOSPITAL_COMMUNITY): Payer: Self-pay | Admitting: Cardiology

## 2023-10-14 NOTE — Anesthesia Postprocedure Evaluation (Signed)
 Anesthesia Post Note  Patient: Brandon Soto  Procedure(s) Performed: CARDIOVERSION     Patient location during evaluation: Cath Lab Level of consciousness: awake and alert Pain management: pain level controlled Vital Signs Assessment: post-procedure vital signs reviewed and stable Respiratory status: spontaneous breathing, nonlabored ventilation and respiratory function stable Cardiovascular status: blood pressure returned to baseline and stable Postop Assessment: no apparent nausea or vomiting Anesthetic complications: no   There were no known notable events for this encounter.  Last Vitals:  Vitals:   10/08/23 0950 10/08/23 0952  BP: 100/65 102/63  Pulse: (!) 59 (!) 57  Resp: (!) 23 15  Temp: 37 C   SpO2: 95% 96%    Last Pain:  Vitals:   10/08/23 0853  TempSrc:   PainSc: 0-No pain                 Torsten Weniger

## 2023-10-14 NOTE — Anesthesia Preprocedure Evaluation (Signed)
 Anesthesia Evaluation  Patient identified by MRN, date of birth, ID band Patient awake    Reviewed: Allergy & Precautions, H&P , NPO status , Patient's Chart, lab work & pertinent test results, reviewed documented beta blocker date and time   History of Anesthesia Complications (+) PONV and history of anesthetic complications  Airway Mallampati: II  TM Distance: >3 FB Neck ROM: full    Dental  (+) Dental Advidsory Given   Pulmonary sleep apnea    breath sounds clear to auscultation       Cardiovascular hypertension, Pt. on home beta blockers and Pt. on medications + CAD, + Past MI (2001 - no CP since stent placement) and + Cardiac Stents  + dysrhythmias Atrial Fibrillation  Rhythm:Irregular     Neuro/Psych CVA    GI/Hepatic ,GERD  ,,  Endo/Other  Hypothyroidism    Renal/GU      Musculoskeletal  (+) Arthritis ,    Abdominal   Peds  Hematology  (+) Blood dyscrasia   Anesthesia Other Findings   Reproductive/Obstetrics                              Anesthesia Physical Anesthesia Plan  ASA: 3  Anesthesia Plan: General   Post-op Pain Management:    Induction: Intravenous  PONV Risk Score and Plan: 3 and Treatment may vary due to age or medical condition  Airway Management Planned: Nasal Cannula  Additional Equipment: None  Intra-op Plan:   Post-operative Plan:   Informed Consent: I have reviewed the patients History and Physical, chart, labs and discussed the procedure including the risks, benefits and alternatives for the proposed anesthesia with the patient or authorized representative who has indicated his/her understanding and acceptance.     Dental advisory given  Plan Discussed with: CRNA  Anesthesia Plan Comments:          Anesthesia Quick Evaluation

## 2023-10-18 NOTE — Progress Notes (Signed)
 Remote pacemaker transmission.

## 2023-10-18 NOTE — Addendum Note (Signed)
 Addended by: Geralyn Flash D on: 10/18/2023 02:35 PM   Modules accepted: Orders

## 2023-10-20 ENCOUNTER — Telehealth: Payer: Self-pay

## 2023-10-20 ENCOUNTER — Ambulatory Visit: Attending: Cardiology

## 2023-10-20 DIAGNOSIS — I442 Atrioventricular block, complete: Secondary | ICD-10-CM

## 2023-10-20 NOTE — Telephone Encounter (Signed)
 Alert received:  Patient is back in Aflutter.  Presenting shows current episode is ongoing. DCCV on 10/08/23. Has f/u with Canary Brim on 11/09/23.   Patient states that he has not felt well since the cardioversion. He was hoping to have increased energy but no changes as of yet.  He does state concern with feeling "electrical shock and vibrations" from his device since DCCV procedure that he has never felt before.  Transmission appears normal but will bring patient in to fully interrogate with industry since he is noticing some changes since the procedure.

## 2023-10-20 NOTE — Patient Instructions (Signed)

## 2023-10-20 NOTE — Telephone Encounter (Signed)
 Please see device clinic apt note 10/20/23.

## 2023-10-21 LAB — CUP PACEART INCLINIC DEVICE CHECK
Date Time Interrogation Session: 20250326225003
Implantable Lead Connection Status: 753985
Implantable Lead Connection Status: 753985
Implantable Lead Implant Date: 20220812
Implantable Lead Implant Date: 20220812
Implantable Lead Location: 753859
Implantable Lead Location: 753860
Implantable Lead Model: 3830
Implantable Lead Model: 5076
Implantable Pulse Generator Implant Date: 20220812

## 2023-10-21 NOTE — Progress Notes (Signed)
 Patient seen in device clinic d/t concern for device vibration. Remote transmission checked and appears normal device function. Confirmed with MDT rep present that PPM does not vibrate. Patient reassured.

## 2023-10-23 ENCOUNTER — Encounter: Payer: Self-pay | Admitting: Cardiology

## 2023-11-09 ENCOUNTER — Encounter: Payer: Self-pay | Admitting: *Deleted

## 2023-11-09 ENCOUNTER — Encounter: Payer: Self-pay | Admitting: Cardiology

## 2023-11-09 ENCOUNTER — Encounter: Payer: Self-pay | Admitting: Pulmonary Disease

## 2023-11-09 ENCOUNTER — Ambulatory Visit: Attending: Pulmonary Disease | Admitting: Pulmonary Disease

## 2023-11-09 ENCOUNTER — Encounter (HOSPITAL_COMMUNITY): Payer: Self-pay

## 2023-11-09 VITALS — BP 102/60 | HR 59 | Ht 73.0 in | Wt 231.0 lb

## 2023-11-09 DIAGNOSIS — I10 Essential (primary) hypertension: Secondary | ICD-10-CM | POA: Diagnosis not present

## 2023-11-09 DIAGNOSIS — R072 Precordial pain: Secondary | ICD-10-CM | POA: Diagnosis not present

## 2023-11-09 DIAGNOSIS — Z950 Presence of cardiac pacemaker: Secondary | ICD-10-CM | POA: Diagnosis not present

## 2023-11-09 DIAGNOSIS — G4733 Obstructive sleep apnea (adult) (pediatric): Secondary | ICD-10-CM

## 2023-11-09 DIAGNOSIS — I2510 Atherosclerotic heart disease of native coronary artery without angina pectoris: Secondary | ICD-10-CM

## 2023-11-09 DIAGNOSIS — D6869 Other thrombophilia: Secondary | ICD-10-CM

## 2023-11-09 DIAGNOSIS — I480 Paroxysmal atrial fibrillation: Secondary | ICD-10-CM | POA: Diagnosis not present

## 2023-11-09 DIAGNOSIS — I442 Atrioventricular block, complete: Secondary | ICD-10-CM

## 2023-11-09 LAB — CUP PACEART INCLINIC DEVICE CHECK
Date Time Interrogation Session: 20250415173414
Implantable Lead Connection Status: 753985
Implantable Lead Connection Status: 753985
Implantable Lead Implant Date: 20220812
Implantable Lead Implant Date: 20220812
Implantable Lead Location: 753859
Implantable Lead Location: 753860
Implantable Lead Model: 3830
Implantable Lead Model: 5076
Implantable Pulse Generator Implant Date: 20220812

## 2023-11-09 NOTE — Progress Notes (Signed)
 Electrophysiology Office Note:   Date:  11/09/2023  ID:  Brandon Soto, DOB 09-21-1945, MRN [id number]  Primary Cardiologist: Brandon Portal, MD Primary Heart Failure: None Electrophysiologist: Brandon Byes, MD       History of Present Illness:   Brandon Soto is a 78 y.o. male with h/o CHB s/p PPM, AF, HTN, HLD, CAD s/p LAD PCI, hypothyroidism, CVA, OSA on CPAP seen today for routine electrophysiology followup.   Since last being seen in our clinic the patient reports has been doing fairly well.  He reports that he feels fatigued and tired at times.  Patient was out working in the yard over the previous weekend and noticed on Sunday, 11/07/2023 that he had a approximately 1 hour of chest discomfort with tightness, shortness of breath, sweaty and nauseated.  He reports the episode resolved with rest.  The following day he had an additional episode that did not last as long but he became sweaty nauseated and short of breath.  He relates that he also has been diagnosed with CLL and may be having sweating related to CLL.  He denies chest pain, palpitations, dyspnea, PND, orthopnea, nausea, vomiting, dizziness, syncope, edema, weight gain, or early satiety.   Review of systems complete and found to be negative unless listed in HPI.   EP Information / Studies Reviewed:    EKG is not ordered today. EKG from 10/08/23 reviewed which showed ASVP 57 bpm     PPM Interrogation-  reviewed in detail today,  See PACEART report.  Device History: Medtronic Dual Chamber PPM implanted 02/24/2021 for CHB  Studies:  ECHO 12/2020 > LVEF 60-65%, LA moderately dilated, RA mild-mod dilated     Arrhythmia / AAD Paroxysmal AF  DCCV > 10/08/23 x1 at 200j biphasic, converted to APVP 80 bpm, ERAF by 10/18/23   Risk Assessment/Calculations:    CHA2DS2-VASc Score = 4   This indicates a 4.8% annual risk of stroke. The patient's score is based upon: CHF History: 0 HTN History: 1 Diabetes History: 0 Stroke  History: 0 Vascular Disease History: 1 Age Score: 2 Gender Score: 0             Physical Exam:   VS:  BP 102/60   Pulse (!) 59   Ht 6\' 1"  (1.854 m)   Wt 231 lb (104.8 kg)   SpO2 98%   BMI 30.48 kg/m    Wt Readings from Last 3 Encounters:  11/09/23 231 lb (104.8 kg)  10/08/23 225 lb (102.1 kg)  10/01/23 237 lb 9.6 oz (107.8 kg)     GEN: Well nourished, well developed in no acute distress NECK: No JVD; No carotid bruits CARDIAC: Regular rate and rhythm, no murmurs, rubs, gallops RESPIRATORY:  Clear to auscultation without rales, wheezing or rhonchi  ABDOMEN: Soft, non-tender, non-distended EXTREMITIES:  No edema; No deformity   ASSESSMENT AND PLAN:    CHB s/p Medtronic PPM  -Normal PPM function -See Pace Art report -No changes today  Paroxysmal AF  AF/AFL by EGM 10/01/23  CHA2DS2-VASc 4. Persistent episode ongoing since mid December 2024. Pt with symptoms of fatigue, sensation in his chest and SOB with exertion.  S/p DCCV on 10/08/23.  -4% burden on device  -ERAF after DCCV on 10/18/23  -not a candidate for flecainide due to CAD -discussed rhythm control strategies with patient > reviewed tikosyn admission vs amiodarone as bridge to ablation.  He is early in his course with AF and suspect he would be a good  candidate for ablation. -he is back in NSR by EGM on today's exam, we will hold initiation of amiodarone at this time, if recurrent symptoms could consider loading before potential ablation  Secondary Hypercoagulable State  -continue Xarelto, dose reviewed and appropriate by CrCl 82 mL/min  Hypertension  -well controlled on current regimen   CAD  -episodes of chest tightness, shortness of breath, nausea and sweating x 2 in the recent weekend.  History of LAD PCI.  Symptoms may be related to new diagnosis of CLL however given cardiac history will pursue nuc med stress testing  OSA  -CPAP compliance encouraged    Informed Consent   Shared Decision Making/Informed  Consent The risks [chest pain, shortness of breath, cardiac arrhythmias, dizziness, blood pressure fluctuations, myocardial infarction, stroke/transient ischemic attack, nausea, vomiting, allergic reaction, radiation exposure, metallic taste sensation and life-threatening complications (estimated to be 1 in 10,000)], benefits (risk stratification, diagnosing coronary artery disease, treatment guidance) and alternatives of a nuclear stress test were discussed in detail with Mr. Brandon Soto and he agrees to proceed.       Disposition:   Follow up with Dr. Marven Soto  12/16/2023 to discuss ablation candidacy with Dr. Marven Soto   Signed, Brandon Doffing, NP-C, AGACNP-BC [facility] - Electrophysiology  11/09/2023, 5:26 PM

## 2023-11-09 NOTE — Patient Instructions (Signed)
 Medication Instructions:  Your physician recommends that you continue on your current medications as directed. Please refer to the Current Medication list given to you today.  *If you need a refill on your cardiac medications before your next appointment, please call your pharmacy*  Lab Work: None ordered If you have labs (blood work) drawn today and your tests are completely normal, you will receive your results only by: MyChart Message (if you have MyChart) OR A paper copy in the mail If you have any lab test that is abnormal or we need to change your treatment, we will call you to review the results.  Testing/Procedures: Your physician has requested that you have a lexiscan myoview. For further information please visit [email or web address] Please follow instruction sheet, as given.  Follow-Up: At [facility], you and your health needs are our priority.  As part of our continuing mission to provide you with exceptional heart care, our providers are all part of one team.  This team includes your primary Cardiologist (physician) and Advanced Practice Providers or APPs (Physician Assistants and Nurse Practitioners) who all work together to provide you with the care you need, when you need it.  Your next appointment:   Next available to discuss ablation  Provider:   Harvie Liner, MD      1st Floor: - Lobby - Registration  - Pharmacy  - Lab - Cafe  2nd Floor: - PV Lab - Diagnostic Testing (echo, CT, nuclear med)  3rd Floor: - Vacant  4th Floor: - TCTS (cardiothoracic surgery) - AFib Clinic - Structural Heart Clinic - Vascular Surgery  - Vascular Ultrasound  5th Floor: - HeartCare Cardiology (general and EP) - Clinical Pharmacy for coumadin, hypertension, lipid, weight-loss medications, and med management appointments    Valet parking services will be available as well.

## 2023-11-16 ENCOUNTER — Encounter (HOSPITAL_COMMUNITY): Payer: Self-pay

## 2023-11-17 ENCOUNTER — Ambulatory Visit (HOSPITAL_COMMUNITY)

## 2023-11-22 ENCOUNTER — Encounter: Payer: Self-pay | Admitting: Pulmonary Disease

## 2023-11-23 ENCOUNTER — Other Ambulatory Visit: Payer: Self-pay | Admitting: Pulmonary Disease

## 2023-11-23 DIAGNOSIS — R079 Chest pain, unspecified: Secondary | ICD-10-CM

## 2023-11-23 DIAGNOSIS — R072 Precordial pain: Secondary | ICD-10-CM

## 2023-11-24 ENCOUNTER — Ambulatory Visit (HOSPITAL_COMMUNITY): Attending: Cardiovascular Disease

## 2023-11-24 DIAGNOSIS — R079 Chest pain, unspecified: Secondary | ICD-10-CM | POA: Diagnosis not present

## 2023-11-24 DIAGNOSIS — R072 Precordial pain: Secondary | ICD-10-CM | POA: Diagnosis not present

## 2023-11-24 DIAGNOSIS — I2510 Atherosclerotic heart disease of native coronary artery without angina pectoris: Secondary | ICD-10-CM | POA: Diagnosis not present

## 2023-11-24 MED ORDER — TECHNETIUM TC 99M TETROFOSMIN IV KIT
8.7000 | PACK | Freq: Once | INTRAVENOUS | Status: AC | PRN
  Administered 2023-11-24: 8.7 via INTRAVENOUS

## 2023-11-24 MED ORDER — TECHNETIUM TC 99M TETROFOSMIN IV KIT
24.7000 | PACK | Freq: Once | INTRAVENOUS | Status: AC | PRN
  Administered 2023-11-24: 24.7 via INTRAVENOUS

## 2023-11-24 MED ORDER — REGADENOSON 0.4 MG/5ML IV SOLN
INTRAVENOUS | Status: AC
  Filled 2023-11-24: qty 5

## 2023-11-24 MED ORDER — REGADENOSON 0.4 MG/5ML IV SOLN
0.4000 mg | Freq: Once | INTRAVENOUS | Status: AC
  Administered 2023-11-24: 0.4 mg via INTRAVENOUS

## 2023-11-26 ENCOUNTER — Telehealth: Payer: Self-pay | Admitting: Pulmonary Disease

## 2023-11-26 DIAGNOSIS — I4891 Unspecified atrial fibrillation: Secondary | ICD-10-CM

## 2023-11-26 LAB — MYOCARDIAL PERFUSION IMAGING
LV dias vol: 157 mL (ref 62–150)
LV sys vol: 79 mL
Nuc Stress EF: 45 %
Peak HR: 80 {beats}/min
Rest HR: 60 {beats}/min
Rest Nuclear Isotope Dose: 8.7 mCi
SDS: 0
SRS: 27
SSS: 20
ST Depression (mm): 0 mm
Stress Nuclear Isotope Dose: 24.7 mCi
TID: 1.02

## 2023-11-26 NOTE — Telephone Encounter (Signed)
 Called patient and discussed findings of Myoview .  Reviewed that images do not show new ischemia.  Old known LAD territory infarct seen and EF of 45%.  Reviewed that we need to correlate with ECHO to confirm pump function.  He is scheduled to see Dr. Marven Slimmer to discuss candidacy for ablation.  He has had recent burden of atrial arrhythmia which may also be source of his chest discomfort. Questions answered.     Plan:  -update ECHO    Creighton Doffing, NP-C, AGACNP-BC [facility] - Electrophysiology  11/26/2023, 1:50 PM

## 2023-11-29 DIAGNOSIS — M1711 Unilateral primary osteoarthritis, right knee: Secondary | ICD-10-CM | POA: Diagnosis not present

## 2023-12-07 ENCOUNTER — Ambulatory Visit (INDEPENDENT_AMBULATORY_CARE_PROVIDER_SITE_OTHER): Payer: Medicare Other

## 2023-12-07 DIAGNOSIS — I442 Atrioventricular block, complete: Secondary | ICD-10-CM

## 2023-12-07 DIAGNOSIS — G4733 Obstructive sleep apnea (adult) (pediatric): Secondary | ICD-10-CM | POA: Diagnosis not present

## 2023-12-08 LAB — CUP PACEART REMOTE DEVICE CHECK
Battery Remaining Longevity: 120 mo
Battery Voltage: 3 V
Brady Statistic AP VP Percent: 1.81 %
Brady Statistic AP VS Percent: 0 %
Brady Statistic AS VP Percent: 97.46 %
Brady Statistic AS VS Percent: 0.73 %
Brady Statistic RA Percent Paced: 2.06 %
Brady Statistic RV Percent Paced: 99.26 %
Date Time Interrogation Session: 20250513222636
Implantable Lead Connection Status: 753985
Implantable Lead Connection Status: 753985
Implantable Lead Implant Date: 20220812
Implantable Lead Implant Date: 20220812
Implantable Lead Location: 753859
Implantable Lead Location: 753860
Implantable Lead Model: 3830
Implantable Lead Model: 5076
Implantable Pulse Generator Implant Date: 20220812
Lead Channel Impedance Value: 323 Ohm
Lead Channel Impedance Value: 380 Ohm
Lead Channel Impedance Value: 399 Ohm
Lead Channel Impedance Value: 646 Ohm
Lead Channel Pacing Threshold Amplitude: 0.875 V
Lead Channel Pacing Threshold Amplitude: 1.125 V
Lead Channel Pacing Threshold Pulse Width: 0.4 ms
Lead Channel Pacing Threshold Pulse Width: 0.4 ms
Lead Channel Sensing Intrinsic Amplitude: 1.75 mV
Lead Channel Sensing Intrinsic Amplitude: 1.75 mV
Lead Channel Sensing Intrinsic Amplitude: 11.625 mV
Lead Channel Sensing Intrinsic Amplitude: 11.625 mV
Lead Channel Setting Pacing Amplitude: 2.25 V
Lead Channel Setting Pacing Amplitude: 2.25 V
Lead Channel Setting Pacing Pulse Width: 0.4 ms
Lead Channel Setting Sensing Sensitivity: 1.2 mV
Zone Setting Status: 755011

## 2023-12-12 ENCOUNTER — Ambulatory Visit: Payer: Self-pay | Admitting: Cardiology

## 2023-12-16 ENCOUNTER — Ambulatory Visit: Admitting: Cardiology

## 2023-12-20 NOTE — Telephone Encounter (Signed)
 Can you please call pharmacy and do the needful

## 2023-12-27 DIAGNOSIS — M1711 Unilateral primary osteoarthritis, right knee: Secondary | ICD-10-CM | POA: Diagnosis not present

## 2023-12-28 ENCOUNTER — Ambulatory Visit (HOSPITAL_COMMUNITY)
Admission: RE | Admit: 2023-12-28 | Discharge: 2023-12-28 | Disposition: A | Discharge status: Home / Self Care | Source: Ambulatory Visit | Attending: Cardiovascular Disease | Admitting: Cardiovascular Disease

## 2023-12-28 DIAGNOSIS — I4891 Unspecified atrial fibrillation: Secondary | ICD-10-CM | POA: Diagnosis not present

## 2023-12-28 LAB — ECHOCARDIOGRAM COMPLETE
Area-P 1/2: 4.15 cm2
P 1/2 time: 680 ms
S' Lateral: 3.9 cm

## 2023-12-28 MED ORDER — PERFLUTREN LIPID MICROSPHERE
1.0000 mL | INTRAVENOUS | Status: AC | PRN
  Administered 2023-12-28: 2 mL via INTRAVENOUS

## 2023-12-29 DIAGNOSIS — G629 Polyneuropathy, unspecified: Secondary | ICD-10-CM | POA: Diagnosis not present

## 2023-12-29 DIAGNOSIS — I10 Essential (primary) hypertension: Secondary | ICD-10-CM | POA: Diagnosis not present

## 2023-12-29 DIAGNOSIS — I480 Paroxysmal atrial fibrillation: Secondary | ICD-10-CM | POA: Diagnosis not present

## 2023-12-29 DIAGNOSIS — C61 Malignant neoplasm of prostate: Secondary | ICD-10-CM | POA: Diagnosis not present

## 2023-12-29 DIAGNOSIS — I2510 Atherosclerotic heart disease of native coronary artery without angina pectoris: Secondary | ICD-10-CM | POA: Diagnosis not present

## 2023-12-29 DIAGNOSIS — E785 Hyperlipidemia, unspecified: Secondary | ICD-10-CM | POA: Diagnosis not present

## 2023-12-29 DIAGNOSIS — Z6830 Body mass index (BMI) 30.0-30.9, adult: Secondary | ICD-10-CM | POA: Diagnosis not present

## 2023-12-29 DIAGNOSIS — I442 Atrioventricular block, complete: Secondary | ICD-10-CM | POA: Diagnosis not present

## 2023-12-29 DIAGNOSIS — E890 Postprocedural hypothyroidism: Secondary | ICD-10-CM | POA: Diagnosis not present

## 2023-12-29 DIAGNOSIS — C9110 Chronic lymphocytic leukemia of B-cell type not having achieved remission: Secondary | ICD-10-CM | POA: Diagnosis not present

## 2023-12-29 DIAGNOSIS — E669 Obesity, unspecified: Secondary | ICD-10-CM | POA: Diagnosis not present

## 2023-12-29 DIAGNOSIS — E1140 Type 2 diabetes mellitus with diabetic neuropathy, unspecified: Secondary | ICD-10-CM | POA: Diagnosis not present

## 2023-12-31 DIAGNOSIS — M1711 Unilateral primary osteoarthritis, right knee: Secondary | ICD-10-CM | POA: Diagnosis not present

## 2024-01-04 ENCOUNTER — Ambulatory Visit: Payer: Self-pay | Admitting: Pulmonary Disease

## 2024-01-06 NOTE — Progress Notes (Signed)
 Electrophysiology Office Follow up Visit Note:    Date:  01/07/2024   ID:  Brandon Soto, DOB [birth year], MRN [id number]  PCP:  Azalia Leo, MD  [facility] Cardiologist:  Lauro Portal, MD  [facility] Electrophysiologist:  Boyce Byes, MD    Interval History:     Brandon Soto is a 78 y.o. male who presents for a follow up visit.   The patient was last seen by [facility] November 09, 2023.  The patient has a history of heart block with a permanent pacemaker in situ, atrial fibrillation, hypertension, hyperlipidemia, coronary artery disease post PCI, hypothyroidism, stroke, obstructive sleep apnea.  He has a recent diagnosis of CLL.  The patient had a cardioversion for atrial fibrillation in March of this year but had ERAF within 10 days.  By device interrogation by Cody Das, the patient has a 4% burden of atrial fibrillation.  He is referred to discuss catheter ablation as a mechanism to achieve sustained rhythm control.  He is with his son today in clinic.  He reports worsening fatigue since his atrial fibrillation has become more frequent.  No problems with his pacemaker.  He takes Xarelto  for stroke prophylaxis without bleeding.  He is getting a gel injection in his right knee later today       Past medical, surgical, social and family history were reviewed.  ROS:   Please see the history of present illness.    All other systems reviewed and are negative.  EKGs/Labs/Other Studies Reviewed:    The following studies were reviewed today:  December 28, 2023 echo EF 45 to 50% RV normal Mildly dilated left and right atrium Mild to moderate MR Moderate TR  January 07, 2024 in-clinic device interrogation personally reviewed Battery longevity 9.1 years Lead parameter stable 30% A-fib burden 98% V pacing Atrial pacing 2.2%        Physical Exam:    VS:  BP 120/62 (BP Location: Left Arm, Patient Position: Sitting, Cuff Size: Large)   Pulse (!) 58   Ht 6' 1 (1.854 m)   Wt  222 lb (100.7 kg)   SpO2 96%   BMI 29.29 kg/m     Wt Readings from Last 3 Encounters:  01/07/24 222 lb (100.7 kg)  11/24/23 231 lb (104.8 kg)  11/09/23 231 lb (104.8 kg)     GEN: no distress CARD: Irregularly irregular, No MRG.  CIED pocket well-healed RESP: No IWOB. CTAB.      ASSESSMENT:    1. Cardiac pacemaker in situ   2. Heart block AV complete (HCC)   3. Persistent atrial fibrillation (HCC)    PLAN:    In order of problems listed above:  #Complete heart block #Permanent pacemaker in situ Device functioning appropriately.  Continue remote monitoring.  #Persistent atrial fibrillation Continue Xarelto  for stroke prophylaxis I discussed treatment options for his atrial fibrillation during today's clinic appointment.  We discussed conservative management, antiarrhythmic drugs and catheter ablation.  He would be a candidate for Tikosyn or amiodarone.  I discussed those medications in detail including the pros and cons.  I discussed the catheter ablation procedure in detail including the risks, recovery and likelihood of success.  Discussed treatment options today for AF including antiarrhythmic drug therapy and ablation. Discussed risks, recovery and likelihood of success with each treatment strategy. Risk, benefits, and alternatives to EP study and ablation for afib were discussed. These risks include but are not limited to stroke, bleeding, vascular damage, tamponade, perforation, damage  to the esophagus, lungs, phrenic nerve and other structures, pulmonary vein stenosis, worsening renal function, coronary vasospasm and death.  Discussed potential need for repeat ablation procedures and antiarrhythmic drugs after an initial ablation. The patient understands these risk and wishes to proceed.  We will therefore proceed with catheter ablation at the next available time.  Carto, ICE, anesthesia are requested for the procedure.  Will also obtain CT PV protocol prior to the procedure  to exclude LAA thrombus and further evaluate atrial anatomy.  #HFrEF I suspect some of this is related to his atrial fibrillation although he has reported EF's dating back many years in the 45 to 50% range. If EF reduced after maintaining sinus or worsens, consider CRT upgrade.   He is to talk to his wife and let us  know if he wants to proceed with scheduling.  Signed, Harvie Liner, MD, [facility], [facility] 01/07/2024 11:01 AM    Electrophysiology [facility]

## 2024-01-07 ENCOUNTER — Telehealth: Payer: Self-pay | Admitting: Cardiology

## 2024-01-07 ENCOUNTER — Encounter: Payer: Self-pay | Admitting: Cardiology

## 2024-01-07 ENCOUNTER — Ambulatory Visit: Attending: Cardiology | Admitting: Cardiology

## 2024-01-07 VITALS — BP 120/62 | HR 58 | Ht 73.0 in | Wt 222.0 lb

## 2024-01-07 DIAGNOSIS — Z950 Presence of cardiac pacemaker: Secondary | ICD-10-CM | POA: Diagnosis not present

## 2024-01-07 DIAGNOSIS — I442 Atrioventricular block, complete: Secondary | ICD-10-CM | POA: Diagnosis not present

## 2024-01-07 DIAGNOSIS — I4819 Other persistent atrial fibrillation: Secondary | ICD-10-CM

## 2024-01-07 DIAGNOSIS — M1711 Unilateral primary osteoarthritis, right knee: Secondary | ICD-10-CM | POA: Diagnosis not present

## 2024-01-07 NOTE — Patient Instructions (Signed)
 Medication Instructions:  Your physician recommends that you continue on your current medications as directed. Please refer to the Current Medication list given to you today.  *If you need a refill on your cardiac medications before your next appointment, please call your pharmacy*  Testing/Procedures: Ablation Your physician has recommended that you have an ablation. Catheter ablation is a medical procedure used to treat some cardiac arrhythmias (irregular heartbeats). During catheter ablation, a long, thin, flexible tube is put into a blood vessel in your groin (upper thigh), or neck. This tube is called an ablation catheter. It is then guided to your heart through the blood vessel. Radio frequency waves destroy small areas of heart tissue where abnormal heartbeats may cause an arrhythmia to start.  Please call us  if you would like to schedule.   Follow-Up: At [facility], you and your health needs are our priority.  As part of our continuing mission to provide you with exceptional heart care, our providers are all part of one team.  This team includes your primary Cardiologist (physician) and Advanced Practice Providers or APPs (Physician Assistants and Nurse Practitioners) who all work together to provide you with the care you need, when you need it.

## 2024-01-07 NOTE — Telephone Encounter (Signed)
 Wife Genevia Kern) stated patient has decided he wants to move forward with getting an ablation and want a call back to discuss next steps.

## 2024-01-10 ENCOUNTER — Other Ambulatory Visit: Payer: Self-pay

## 2024-01-10 DIAGNOSIS — I4819 Other persistent atrial fibrillation: Secondary | ICD-10-CM

## 2024-01-14 DIAGNOSIS — M1711 Unilateral primary osteoarthritis, right knee: Secondary | ICD-10-CM | POA: Diagnosis not present

## 2024-01-21 NOTE — Addendum Note (Signed)
 Addended by: VICCI SELLER A on: 01/21/2024 12:37 PM   Modules accepted: Orders

## 2024-01-21 NOTE — Progress Notes (Signed)
 Remote pacemaker transmission.

## 2024-01-25 ENCOUNTER — Telehealth: Payer: Self-pay | Admitting: Pulmonary Disease

## 2024-01-25 ENCOUNTER — Ambulatory Visit: Payer: Self-pay | Admitting: Pulmonary Disease

## 2024-01-25 DIAGNOSIS — I2510 Atherosclerotic heart disease of native coronary artery without angina pectoris: Secondary | ICD-10-CM

## 2024-01-25 MED ORDER — ISOSORBIDE MONONITRATE ER 30 MG PO TB24: 15.0000 mg | ORAL_TABLET | Freq: Every day | ORAL | 1 refills | Status: DC

## 2024-01-25 NOTE — Telephone Encounter (Signed)
 NM Stress test results reviewed with Dr. Court.  Likely all old findings from prior MI. Given patients recent symptoms, will add Imdur 15 mg daily for hx of prior chest discomfort.     Patient asks to be moved up on the ablation list if possible.  Will discuss with Dr. Hiram nurse.    Daphne Barrack, NP-C, AGACNP-BC [facility] - Electrophysiology  01/25/2024, 12:20 PM

## 2024-02-11 NOTE — Telephone Encounter (Signed)
 Pt made aware Brandon Soto sent order request to Dr. Lafonda office.  Advised pt to contact them with questions and let us  know if we need to resend orders.  Pt agreeable to plan.

## 2024-02-18 ENCOUNTER — Telehealth: Payer: Self-pay | Admitting: Cardiology

## 2024-02-18 NOTE — Telephone Encounter (Signed)
 Pt's wife said she is returning a call from a nurse from yesterday

## 2024-02-18 NOTE — Telephone Encounter (Signed)
 Left message for patient's wife to return the call.

## 2024-02-21 NOTE — Telephone Encounter (Signed)
Patient's wife is returning phone call. Please advise.

## 2024-02-23 ENCOUNTER — Encounter: Payer: Self-pay | Admitting: Cardiovascular Disease

## 2024-02-23 ENCOUNTER — Ambulatory Visit: Attending: Cardiology | Admitting: Cardiovascular Disease

## 2024-02-23 VITALS — BP 110/62 | HR 60 | Ht 73.0 in | Wt 219.0 lb

## 2024-02-23 DIAGNOSIS — M1711 Unilateral primary osteoarthritis, right knee: Secondary | ICD-10-CM | POA: Diagnosis not present

## 2024-02-23 DIAGNOSIS — I10 Essential (primary) hypertension: Secondary | ICD-10-CM

## 2024-02-23 DIAGNOSIS — I442 Atrioventricular block, complete: Secondary | ICD-10-CM | POA: Diagnosis not present

## 2024-02-23 DIAGNOSIS — I2510 Atherosclerotic heart disease of native coronary artery without angina pectoris: Secondary | ICD-10-CM

## 2024-02-23 DIAGNOSIS — I519 Heart disease, unspecified: Secondary | ICD-10-CM | POA: Diagnosis not present

## 2024-02-23 DIAGNOSIS — I480 Paroxysmal atrial fibrillation: Secondary | ICD-10-CM

## 2024-02-23 DIAGNOSIS — E7800 Pure hypercholesterolemia, unspecified: Secondary | ICD-10-CM | POA: Diagnosis not present

## 2024-02-23 DIAGNOSIS — I4819 Other persistent atrial fibrillation: Secondary | ICD-10-CM

## 2024-02-23 NOTE — Assessment & Plan Note (Signed)
 History of essential hypertension with blood pressure measured today at 110/62.  He is on losartan  and chlorthalidone .

## 2024-02-23 NOTE — Assessment & Plan Note (Signed)
 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 Cindie 03/07/2021.

## 2024-02-23 NOTE — Patient Instructions (Signed)
 Medication Instructions:  Your physician recommends that you continue on your current medications as directed. Please refer to the Current Medication list given to you today.  *If you need a refill on your cardiac medications before your next appointment, please call your pharmacy*   Follow-Up: At [facility], you and your health needs are our priority.  As part of our continuing mission to provide you with exceptional heart care, our providers are all part of one team.  This team includes your primary Cardiologist (physician) and Advanced Practice Providers or APPs (Physician Assistants and Nurse Practitioners) who all work together to provide you with the care you need, when you need it.  Your next appointment:   6 month(s)  Provider:   Jon Hails, PA-C, Callie Goodrich, PA-C, Rollo Louder, PA-C, or Damien Braver, NP        Then, Dorn Lesches, MD will plan to see you again in 12 month(s).    We recommend signing up for the patient portal called MyChart.  Sign up information is provided on this After Visit Summary.  MyChart is used to connect with patients for Virtual Visits (Telemedicine).  Patients are able to view lab/test results, encounter notes, upcoming appointments, etc.  Non-urgent messages can be sent to your provider as well.   To learn more about what you can do with MyChart, go to [email or web address].

## 2024-02-23 NOTE — Assessment & Plan Note (Signed)
 History of CAD status post LAD intervention by Dr. Larnell Capri in 2001 after an anterior STEMI.  I catheterized him in 2004 revealing a patent stent with an anteroapical wall motion abnormality.  His EF was 45 to 50%.  He denies chest pain.

## 2024-02-23 NOTE — Assessment & Plan Note (Signed)
 History of hyperlipidemia on statin therapy with lipid profile performed 05/13/2023 revealing total cholesterol 155, LDL 73 and HDL 51.

## 2024-02-23 NOTE — Assessment & Plan Note (Signed)
 History of PAF status post DC cardioversion by Dr. Kate 10/08/2023.  He is symptomatic when he is in A-fib.  He is scheduled to have an A-fib ablation by Dr. Cindie 04/04/2024.

## 2024-02-23 NOTE — Assessment & Plan Note (Signed)
 History of LV dysfunction with an EF by 2D echo 45 to 50% most likely related to his anterior MI back in 2001.  He does have scar on Myoview  stress testing as well.

## 2024-02-23 NOTE — Progress Notes (Signed)
 02/23/2024 TRUE GARCIAMARTINEZ   June 08, 1946  [id number]  Primary Physician Stephane Leita DEL, MD Primary Cardiologist: Dorn JINNY Lesches MD FACP, [place], [place], [place]  HPI:  Brandon Soto is a 78 y.o.  mildly overweight, married, Caucasian male father of 2, grandfather to 3 grandchildren who I last saw in the office 08/02/2023.  His wife Brandon Soto is also a patient of mine... He has a history of CAD status post LAD intervention by Dr. Larnell Capri in 2001 in the setting of a myocardial infarction. I catheterization him in 2004 revealing a patent stent with an anteroapical wall motion abnormality and EF of 45-50%. His other problems include hypertension, hyperlipidemia, and hypothyroidism. He denies chest pain or shortness of breath. His last Myoview  performed in October of 2011 showed apical scar, and echo showed a normal EF. His most recent lab work revealed a total cholesterol of 146, LDL of 72, and HDL 49.he had a left total knee replacement performed by Dr. Beryl Millman 06/27/12 which was uncomplicated. He denies chest pain or shortness of breath. His primary care physician, Jayson Lawrence, follows his lipid profile closely. He had a recent stroke apparently in 2 vascular territories. Workup has been unrevealing including carotid Dopplers which were essentially normal and a transthoracic echo that did not show an embolic source. Dr. Lynwood Rakers placed a loop recorder to rule out paroxysmal atrial fibrillation as a cause. He was originally placed on aspirin and Plavix  however the loop recorder did reveal short bursts of A. fib and he was switched to Xarelto . He also has had a sleep study and has been placed on C Pap followed by Dr. Shlomo.    He has noticed some increasing dyspnea on exertion and some bradycardia.    He does have a history of thyroid disease as well.  He denies chest pain.  Recent 2D echo performed 10/13/2019 revealed normal LV systolic function with grade 1 diastolic dysfunction.   I performed an event  monitoring revealing episodes of complete heart block with pauses longest 7 seconds and multiple shorter pauses but still significant.  He is not on any negative chronotropic drugs.  He was seen by Dr. Fernande in for evaluation of this he did not feel that he was a candidate for device implantation.  Most of his long pauses were in the a.m. hours.   Since I saw him a year ago he has done fine.  Dr. Cindie did implant a pacemaker 03/07/2021 with removal of his loop recorder for complete heart block.  He feels clinically improved.  He denies chest pain but does complain of shortness of breath..  He was recently diagnosed with prostate cancer and is being treated by Dr. 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 Kate for A-fib.  He has seen Dr. Cindie who is scheduled to have him get an A-fib ablation 04/04/2024.  His major complaint however is right knee pain and apparently he needs this replaced and is seeing Dr. Josefina today.  He did have a 2D echo performed 12/28/2023 revealing EF of 45 to 50% with mild to moderate MR.  A Myoview  stress test showed anterior scar without ischemia.   Current Meds  Medication Sig   acetaminophen  (TYLENOL ) 500 MG tablet Take 1,000 mg by mouth every 6 (six) hours as needed for mild pain (For pain in hands).   Cyanocobalamin (B-12)  5000 MCG CAPS Take 5,000 mcg by mouth daily.   docusate sodium  (COLACE) 100 MG capsule Take 100 mg by mouth daily.   esomeprazole  (NEXIUM ) 40 MG capsule Take 40 mg by mouth daily.   finasteride (PROSCAR) 5 MG tablet Take 5 mg by mouth daily.   isosorbide  mononitrate (IMDUR ) 30 MG 24 hr tablet Take 0.5 tablets (15 mg total) by mouth daily.   JARDIANCE 10 MG TABS tablet Take 10 mg by mouth daily.   levothyroxine  (SYNTHROID , LEVOTHROID) 175 MCG tablet Take 175 mcg by mouth daily before breakfast.   losartan  (COZAAR ) 50 MG tablet 1 tablet Orally Once a  day   Omega-3 Fatty Acids ([employer] FISH OIL) 1000 MG CAPS Take 1,000 mg by mouth daily.   pregabalin (LYRICA) 150 MG capsule Take 150 mg by mouth 2 (two) times daily.   rivaroxaban  (XARELTO ) 20 MG TABS tablet Take 1 tablet (20 mg total) by mouth daily with supper.   rosuvastatin  (CRESTOR ) 20 MG tablet Take 1 tablet (20 mg total) by mouth at bedtime.   Semaglutide, 1 MG/DOSE, (OZEMPIC, 1 MG/DOSE,) 4 MG/3ML SOPN Inject 1 mg into the skin once a week.   tamsulosin  (FLOMAX ) 0.4 MG CAPS capsule Take 0.4 mg by mouth in the morning and at bedtime.   Turmeric (QC TUMERIC COMPLEX PO) Take 700 mg by mouth.     No Known Allergies  Social History   Socioeconomic History   Marital status: Married    Spouse name: Not on file   Number of children: 2   Years of education: Not on file   Highest education level: Not on file  Occupational History   Occupation: retired  Tobacco Use   Smoking status: Never   Smokeless tobacco: Never  Vaping Use   Vaping status: Never Used  Substance and Sexual Activity   Alcohol use: No    Alcohol/week: 0.0 standard drinks of alcohol   Drug use: No   Sexual activity: Yes  Other Topics Concern   Not on file  Social History Narrative   Married and retired, 2 children   Stays busy keeping up his [place] home plus he has a home in [place]  and takes care of his mother-in-law's home   No alcohol tobacco or drug use   Social Drivers of Corporate investment banker Strain: Not on file  Food Insecurity: No Food Insecurity (12/15/2022)   Hunger Vital Sign    Worried About Running Out of Food in the Last Year: Never true    Ran Out of Food in the Last Year: Never true  Transportation Needs: No Transportation Needs (12/15/2022)   PRAPARE - Administrator, Civil Service (Medical): No    Lack of Transportation (Non-Medical): No  Physical Activity: Insufficiently Active (02/08/2019)   Received from [facility] visits prior to  09/26/2022.   Exercise Vital Sign    Days of Exercise per Week: 3 days    Minutes of Exercise per Session: 30 min  Stress: Not on file  Social Connections: Not on file  Intimate Partner Violence: Patient Unable To Answer (12/15/2022)   Humiliation, Afraid, Rape, and Kick questionnaire    Fear of Current or Ex-Partner: Patient unable to answer    Emotionally Abused: Patient unable to answer    Physically Abused: Patient unable to answer    Sexually Abused: Patient unable to answer     Review of Systems: General: negative for chills, fever, night sweats or weight  changes.  Cardiovascular: negative for chest pain, dyspnea on exertion, edema, orthopnea, palpitations, paroxysmal nocturnal dyspnea or shortness of breath Dermatological: negative for rash Respiratory: negative for cough or wheezing Urologic: negative for hematuria Abdominal: negative for nausea, vomiting, diarrhea, bright red blood per rectum, melena, or hematemesis Neurologic: negative for visual changes, syncope, or dizziness All other systems reviewed and are otherwise negative except as noted above.    Blood pressure 110/62, pulse 60, height 6' 1 (1.854 m), weight 219 lb (99.3 kg), SpO2 94%.  General appearance: alert and no distress Neck: no adenopathy, no carotid bruit, no JVD, supple, symmetrical, trachea midline, and thyroid not enlarged, symmetric, no tenderness/mass/nodules Lungs: clear to auscultation bilaterally Heart: regular rate and rhythm, S1, S2 normal, no murmur, click, rub or gallop Extremities: extremities normal, atraumatic, no cyanosis or edema Pulses: 2+ and symmetric Skin: Skin color, texture, turgor normal. No rashes or lesions Neurologic: Grossly normal  EKG EKG Interpretation Date/Time:  Wednesday February 23 2024 10:44:09 EDT Ventricular Rate:  60 PR Interval:  184 QRS Duration:  178 QT Interval:  478 QTC Calculation: 478 R Axis:   -56  Text Interpretation: Atrial-sensed ventricular-paced  rhythm When compared with ECG of 08-Oct-2023 09:23, Vent. rate has increased BY   3 BPM Confirmed by Court Carrier [phone]) on 02/23/2024 11:19:46 AM    ASSESSMENT AND PLAN:   Hypertension History of essential hypertension with blood pressure measured today at 110/62.  He is on losartan  and chlorthalidone .  Hypercholesteremia History of hyperlipidemia on statin therapy with lipid profile performed 05/13/2023 revealing total cholesterol 155, LDL 73 and HDL 51.  PAF (paroxysmal atrial fibrillation) (HCC) History of PAF status post DC cardioversion by Dr. Kate 10/08/2023.  He is symptomatic when he is in A-fib.  He is scheduled to have an A-fib ablation by Dr. Cindie 04/04/2024.  Coronary artery disease History of CAD status post LAD intervention by Dr. Larnell Capri in 2001 after an anterior STEMI.  I catheterized him in 2004 revealing a patent stent with an anteroapical wall motion abnormality.  His EF was 45 to 50%.  He denies chest pain.  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 Cindie 03/07/2021.  LV dysfunction History of LV dysfunction with an EF by 2D echo 45 to 50% most likely related to his anterior MI back in 2001.  He does have scar on Myoview  stress testing as well.     Carrier DOROTHA Court MD FACP,FACC,FAHA, [facility] 02/23/2024 11:32 AM

## 2024-03-01 ENCOUNTER — Telehealth: Payer: Self-pay

## 2024-03-01 NOTE — Telephone Encounter (Signed)
   Pre-operative Risk Assessment    Patient Name: Brandon Soto  DOB: [birth year] MRN: [id number]   Date of last office visit: 02/23/24 WITH DR. COURT Date of next office visit: TBD   Request for Surgical Clearance    Procedure:  RIGHT PARTIAL KNEE REPLACEMENT   Date of Surgery:  Clearance TBD                                Surgeon:  DR. [name] Surgeon's Group or Practice Name:  [facility] Phone number:  [phone] ATTN: MAEOLA DIVERS Fax number:  [phone]   Type of Clearance Requested:   - Pharmacy:  Hold Rivaroxaban  (Xarelto ) NEEDS INSTRUCTIONS WHEN TO HOLD   Type of Anesthesia:  Spinal   Additional requests/questions:    Signed, Mylinda Lee   03/01/2024, 11:06 AM

## 2024-03-02 ENCOUNTER — Telehealth: Payer: Self-pay | Admitting: Cardiology

## 2024-03-02 ENCOUNTER — Encounter: Payer: Self-pay | Admitting: Cardiovascular Disease

## 2024-03-02 ENCOUNTER — Encounter: Payer: Self-pay | Admitting: Hematology and Oncology

## 2024-03-02 DIAGNOSIS — G4733 Obstructive sleep apnea (adult) (pediatric): Secondary | ICD-10-CM | POA: Diagnosis not present

## 2024-03-02 NOTE — Telephone Encounter (Signed)
 Spoke to patient/patients wife - patient is scheduled to see Daphne Barrack, NP to discuss.

## 2024-03-02 NOTE — Telephone Encounter (Signed)
 Called and spoke with pt and his wife.  She states pt's knee needs to be addressed before the ablation can be done. She also states Brandi mentioned a pill instead of the ablation for the heart.  Aware this message will be sent to [facility] and procedure scheduler to be addressed.

## 2024-03-02 NOTE — Telephone Encounter (Signed)
 Wife Gennie) stated patient will need to have knee surgery prior to having ablation.  Wife wants to reschedule ablation on 9/9 and canceled CT scan test.

## 2024-03-03 ENCOUNTER — Encounter: Payer: Self-pay | Admitting: Pulmonary Disease

## 2024-03-03 ENCOUNTER — Ambulatory Visit: Attending: Pulmonary Disease | Admitting: Pulmonary Disease

## 2024-03-03 VITALS — BP 106/58 | HR 61 | Ht 73.0 in | Wt 223.8 lb

## 2024-03-03 DIAGNOSIS — I442 Atrioventricular block, complete: Secondary | ICD-10-CM | POA: Diagnosis not present

## 2024-03-03 DIAGNOSIS — Z950 Presence of cardiac pacemaker: Secondary | ICD-10-CM | POA: Diagnosis not present

## 2024-03-03 DIAGNOSIS — I4819 Other persistent atrial fibrillation: Secondary | ICD-10-CM | POA: Diagnosis not present

## 2024-03-03 DIAGNOSIS — I10 Essential (primary) hypertension: Secondary | ICD-10-CM | POA: Diagnosis not present

## 2024-03-03 DIAGNOSIS — D6869 Other thrombophilia: Secondary | ICD-10-CM

## 2024-03-03 DIAGNOSIS — I2510 Atherosclerotic heart disease of native coronary artery without angina pectoris: Secondary | ICD-10-CM | POA: Diagnosis not present

## 2024-03-03 DIAGNOSIS — G4733 Obstructive sleep apnea (adult) (pediatric): Secondary | ICD-10-CM | POA: Diagnosis not present

## 2024-03-03 MED ORDER — AMIODARONE HCL 200 MG PO TABS: ORAL_TABLET | ORAL | 0 refills | Status: DC

## 2024-03-03 NOTE — Progress Notes (Signed)
 Electrophysiology Office Note:   Date:  03/03/2024  ID:  DASAN HARDMAN, DOB 04/18/1946, MRN [id number]  Primary Cardiologist: Dorn Lesches, MD Primary Heart Failure: None Electrophysiologist: OLE ONEIDA HOLTS, MD      History of Present Illness:   Brandon Soto is a 78 y.o. male with h/o CHB s/p PPM, AF, HTN, HLD, CAD s/p LAD PCI, hypothyroidism, CVA, OSA on CPAP seen today for routine electrophysiology followup.   He was seen in 09/2023 EP Clinic and found to have AF from 06/2023 > sent for DCCV and converted. He subsequently went back into AFL. At subsequent follow up in 10/2023 he was in NSR.  AAD's were discussed as a bridge to ablation. He was seen by Dr. HOLTS for consideration of ablation on 01/07/24 and wanted to talk with his wife before scheduling. In the midst of the AF episodes, he was having chest pain and NM stress testing was completed with findings consistent with his old MI, Imdur  was added. He initially called back and scheduled the ablation but subsequently postponed due to knee pain and need for surgery.   Since last being seen in our clinic the patient reports doing he is in dire need of getting his knee fixed as he has significant pain and it is affecting his gait.  He reports his has felt well recently in regards to his heart rhythm. He wants to pursue ablation once his knee rehab is over.   He denies chest pain, palpitations, dyspnea, PND, orthopnea, nausea, vomiting, dizziness, syncope, edema, weight gain, or early satiety.   Review of systems complete and found to be negative unless listed in HPI.   EP Information / Studies Reviewed:    EKG is not ordered today. EKG from 02/23/24 reviewed which showed ASVP 60 bpm      Device History: Medtronic Dual Chamber PPM implanted 03/07/2021 for CHB   Studies:  ECHO 12/2020 > LVEF 60-65%, LA moderately dilated, RA mild-mod dilated     Arrhythmia / AAD Paroxysmal AF DCCV 10/08/23 > 200j x1 biphasic, converted to APVP 80 bpm,  ERAF by 10/18/23    Risk Assessment/Calculations:    CHA2DS2-VASc Score =     This indicates a  % annual risk of stroke. The patient's score is based upon:              Physical Exam:   VS:  BP (!) 106/58   Pulse 61   Ht 6' 1 (1.854 m)   Wt 223 lb 12.8 oz (101.5 kg)   SpO2 94%   BMI 29.53 kg/m    Wt Readings from Last 3 Encounters:  03/03/24 223 lb 12.8 oz (101.5 kg)  02/23/24 219 lb (99.3 kg)  01/07/24 222 lb (100.7 kg)     GEN: Well nourished, well developed in no acute distress NECK: No JVD; No carotid bruits CARDIAC: Regular rate and rhythm, no murmurs, rubs, gallops, no tethering RESPIRATORY:  Clear to auscultation without rales, wheezing or rhonchi  ABDOMEN: Soft, non-tender, non-distended EXTREMITIES:  No edema; No deformity   ASSESSMENT AND PLAN:    CHB s/p Medtronic PPM  -normal PPM function  -limited device check in clinic today as recently had full in clinic check > appears he had AF from March to the end of May and has been SR since early-mid July with only a short period of AF.  Currently ASVP / NSR by EGM.   -will ask device clinic to review for his upcoming surgery. He is  pending cardiac clearance from Dr. Court.   -no changes today   Paroxysmal AF  AF/AFL by EGM 10/01/23  CHA2DS2-VASc 4, episode from 06/2023 > 09/2023 s/p DCCV  -OAC for stroke prophylaxis  -not a candidate for flecainide due to CAD -previously discussed rhythm control strategies > reviewed again with patient / wife in clinic, will begin amiodarone  as they prefer to avoid hospital admit for tikosyn. Plan to use amio as bridge to ablation > his knee takes precedence at this time.  Will delay ablation for now and have pt follow up with Dr. Cindie in 6 months -get baseline LFT's, TSH/free T4 for amiodarone  initiation   Secondary Hypercoagulable State  -continue Xarelto  20 mg, dose reviewed and appropriate   Hypertension  -well controlled on current regimen   CAD  -no anginal symptoms    -reports having a difficult time cutting imdur     OSA  -CPAP compliance encouraged     Follow up with Dr. Cindie or EP APP in 6 months  Signed, Daphne Barrack, NP-C, AGACNP-BC [facility] - Electrophysiology  03/03/2024, 5:12 PM

## 2024-03-03 NOTE — Patient Instructions (Signed)
 Medication Instructions:  Start amiodarone  200 mg twice daily for 7 days and then reduce to 200 mg daily thereafter. *If you need a refill on your cardiac medications before your next appointment, please call your pharmacy*  Lab Work: LFT'S, TSH, FreeT4-TODAY If you have labs (blood work) drawn today and your tests are completely normal, you will receive your results only by: MyChart Message (if you have MyChart) OR A paper copy in the mail If you have any lab test that is abnormal or we need to change your treatment, we will call you to review the results.  Follow-Up: At [facility], you and your health needs are our priority.  As part of our continuing mission to provide you with exceptional heart care, our providers are all part of one team.  This team includes your primary Cardiologist (physician) and Advanced Practice Providers or APPs (Physician Assistants and Nurse Practitioners) who all work together to provide you with the care you need, when you need it.  Your next appointment:   6 month(s)  Provider:   Ole Holts, MD

## 2024-03-04 LAB — HEPATIC FUNCTION PANEL
ALT: 22 IU/L (ref 0–44)
AST: 20 IU/L (ref 0–40)
Albumin: 4.4 g/dL (ref 3.8–4.8)
Alkaline Phosphatase: 50 IU/L (ref 44–121)
Bilirubin Total: 0.8 mg/dL (ref 0.0–1.2)
Bilirubin, Direct: 0.28 mg/dL (ref 0.00–0.40)
Total Protein: 6.2 g/dL (ref 6.0–8.5)

## 2024-03-04 LAB — TSH: TSH: 2.13 u[IU]/mL (ref 0.450–4.500)

## 2024-03-04 LAB — T4, FREE: Free T4: 1.68 ng/dL (ref 0.82–1.77)

## 2024-03-05 ENCOUNTER — Ambulatory Visit: Payer: Self-pay | Admitting: Pulmonary Disease

## 2024-03-06 ENCOUNTER — Encounter: Payer: Self-pay | Admitting: Cardiology

## 2024-03-06 ENCOUNTER — Telehealth: Payer: Self-pay

## 2024-03-06 DIAGNOSIS — C61 Malignant neoplasm of prostate: Secondary | ICD-10-CM | POA: Diagnosis not present

## 2024-03-06 NOTE — Telephone Encounter (Signed)
 Faxed clearance form to [facility] @ [phone] with confirmation.

## 2024-03-06 NOTE — Progress Notes (Signed)
 PERIOPERATIVE PRESCRIPTION FOR IMPLANTED CARDIAC DEVICE PROGRAMMING   Patient Information:  Patient: Brandon Soto  MRN: [id number]  Date of Birth: 24-Apr-1946     Procedure:  RIGHT PARTIAL KNEE REPLACEMENT    Date of Surgery:  Clearance TBD                                  Surgeon:  DR. [name] Surgeon's Group or Practice Name:  [facility] Phone number:  [phone] ATTN: MAEOLA DIVERS Fax number:  [phone]   Device Information:   Clinic EP Physician:   Dr. Ole Holts Device Type:  Pacemaker Manufacturer and Phone #:  Medtronic: [id number] Pacemaker Dependent?:  Yes Date of Last Device Check:  01/07/2024         Normal Device Function?:  Yes     Electrophysiologist's Recommendations:   Have magnet available. Provide continuous ECG monitoring when magnet is used or reprogramming is to be performed.  Procedure should not interfere with device function.  No device programming or magnet placement needed.  Per Device Clinic Standing Orders, Brandon Soto  03/06/2024 12:44 PM

## 2024-03-07 ENCOUNTER — Ambulatory Visit: Payer: Medicare Other

## 2024-03-07 DIAGNOSIS — K5790 Diverticulosis of intestine, part unspecified, without perforation or abscess without bleeding: Secondary | ICD-10-CM | POA: Insufficient documentation

## 2024-03-07 DIAGNOSIS — I442 Atrioventricular block, complete: Secondary | ICD-10-CM

## 2024-03-07 DIAGNOSIS — C61 Malignant neoplasm of prostate: Secondary | ICD-10-CM | POA: Insufficient documentation

## 2024-03-07 DIAGNOSIS — C9110 Chronic lymphocytic leukemia of B-cell type not having achieved remission: Secondary | ICD-10-CM | POA: Insufficient documentation

## 2024-03-07 DIAGNOSIS — D696 Thrombocytopenia, unspecified: Secondary | ICD-10-CM | POA: Insufficient documentation

## 2024-03-07 DIAGNOSIS — C44612 Basal cell carcinoma of skin of right upper limb, including shoulder: Secondary | ICD-10-CM | POA: Insufficient documentation

## 2024-03-07 NOTE — Telephone Encounter (Signed)
 Patient with diagnosis of A Fib on Xarelto  for anticoagulation. DCCV 10/08/23  Procedure:  RIGHT PARTIAL KNEE REPLACEMENT  Date of procedure: TBD   CHA2DS2-VASc Score = 7  This indicates a 11.2% annual risk of stroke. The patient's score is based upon: CHF History: 0 HTN History: 1 Diabetes History: 1 Stroke History: 2 Vascular Disease History: 1 Age Score: 2 Gender Score: 0     CrCl 81 ml/min Platelet count 152K  Patient will need to hold Xarelto  for 3 days prior to procedure. Due to elevated risk score and history of CVA, will route to Dr Court to see if this is appropriate   **This guidance is not considered finalized until pre-operative APP has relayed final recommendations.**

## 2024-03-08 LAB — CUP PACEART REMOTE DEVICE CHECK
Battery Remaining Longevity: 116 mo
Battery Voltage: 3 V
Brady Statistic AP VP Percent: 1.18 %
Brady Statistic AP VS Percent: 0 %
Brady Statistic AS VP Percent: 98.29 %
Brady Statistic AS VS Percent: 0.49 %
Brady Statistic RA Percent Paced: 1.44 %
Brady Statistic RV Percent Paced: 99.45 %
Date Time Interrogation Session: 20250812055853
Implantable Lead Connection Status: 753985
Implantable Lead Connection Status: 753985
Implantable Lead Implant Date: 20220812
Implantable Lead Implant Date: 20220812
Implantable Lead Location: 753859
Implantable Lead Location: 753860
Implantable Lead Model: 3830
Implantable Lead Model: 5076
Implantable Pulse Generator Implant Date: 20220812
Lead Channel Impedance Value: 285 Ohm
Lead Channel Impedance Value: 361 Ohm
Lead Channel Impedance Value: 361 Ohm
Lead Channel Impedance Value: 608 Ohm
Lead Channel Pacing Threshold Amplitude: 1 V
Lead Channel Pacing Threshold Amplitude: 1.125 V
Lead Channel Pacing Threshold Pulse Width: 0.4 ms
Lead Channel Pacing Threshold Pulse Width: 0.4 ms
Lead Channel Sensing Intrinsic Amplitude: 1 mV
Lead Channel Sensing Intrinsic Amplitude: 1 mV
Lead Channel Sensing Intrinsic Amplitude: 11.375 mV
Lead Channel Sensing Intrinsic Amplitude: 11.375 mV
Lead Channel Setting Pacing Amplitude: 2 V
Lead Channel Setting Pacing Amplitude: 2.25 V
Lead Channel Setting Pacing Pulse Width: 0.4 ms
Lead Channel Setting Sensing Sensitivity: 1.2 mV
Zone Setting Status: 755011

## 2024-03-08 NOTE — Telephone Encounter (Signed)
   Patient Name: Brandon Soto  DOB: 1946/05/08 MRN: [id number]  Primary Cardiologist: Dorn Lesches, MD  Clinical pharmacists have reviewed the patient's past medical history, labs, and current medications as part of preoperative protocol coverage. The following recommendations have been made:  CHA2DS2-VASc Score = 7  This indicates a 11.2% annual risk of stroke. The patient's score is based upon: CHF History: 0 HTN History: 1 Diabetes History: 1 Stroke History: 2 Vascular Disease History: 1 Age Score: 2 Gender Score: 0      CrCl 81 ml/min Platelet count 152K   Patient will need to hold Xarelto  for 3 days prior to procedure. Due to elevated risk score and history of CVA, will route to Dr Lesches to see if this is appropriate   I will route this recommendation to the requesting party via Epic fax function and remove from pre-op pool.  Please call with questions.  Lamarr Satterfield, NP 03/08/2024, 8:53 AM

## 2024-03-09 ENCOUNTER — Ambulatory Visit: Payer: Self-pay | Admitting: Cardiology

## 2024-03-09 ENCOUNTER — Other Ambulatory Visit (HOSPITAL_COMMUNITY)

## 2024-03-13 DIAGNOSIS — N401 Enlarged prostate with lower urinary tract symptoms: Secondary | ICD-10-CM | POA: Diagnosis not present

## 2024-03-13 DIAGNOSIS — C61 Malignant neoplasm of prostate: Secondary | ICD-10-CM | POA: Diagnosis not present

## 2024-03-13 DIAGNOSIS — R3915 Urgency of urination: Secondary | ICD-10-CM | POA: Diagnosis not present

## 2024-03-13 DIAGNOSIS — R350 Frequency of micturition: Secondary | ICD-10-CM | POA: Diagnosis not present

## 2024-03-13 DIAGNOSIS — R351 Nocturia: Secondary | ICD-10-CM | POA: Diagnosis not present

## 2024-03-14 ENCOUNTER — Other Ambulatory Visit: Payer: Self-pay | Admitting: Hematology and Oncology

## 2024-03-14 ENCOUNTER — Ambulatory Visit (HOSPITAL_COMMUNITY)

## 2024-03-14 DIAGNOSIS — C9110 Chronic lymphocytic leukemia of B-cell type not having achieved remission: Secondary | ICD-10-CM

## 2024-03-14 NOTE — Progress Notes (Unsigned)
 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 Patient found to have  diffuse lymphadenopathy in the neck, chest, abdomen and pelvis with mild hypermetabolism. Most of this disease is Deauville 3. Findings most likely due to a low grade lymphoma such as CLL 12/02/2022: Ultrasound guided core biopsy performed findings consistent with SLL/CLL. 12/15/2022: establish care with Dr. Federico   Interval History:  Brandon Soto Sams 78 y.o. male with medical history significant for SLL/CLL who presents for a follow up visit. The patient's last visit was on 09/15/2023. In the interim since the last visit he has had no major changes in his health.  On exam today Brandon Soto reports he has been well overall in the interim since our last visit 6 months ago.  He reports he is not having any bumps or lumps concerning for lymphadenopathy.  He denies any swelling in his abdomen or in his legs.  He reports he had no major changes in his health.  Reports that he is eager to have a knee surgery done but unfortunately requires cardiac clearance first.  We have provided him clearance from a hematological standpoint.  He reports his energy today is about a 5 out of 10.  He is not having any lightheadedness or dizziness but is having some shortness of breath on exertion or when his A-fib is uncontrolled.  He reports he is had no runny nose, sore throat, cough.  He denies any fevers, chills, sweats.  He reports his weight is declining as he has been on Ozempic.  He is down 20 to 25 pounds and he has  a target weight of 190 pounds.  A full 10 point ROS is otherwise negative.  MEDICAL HISTORY:  Past Medical History:  Diagnosis Date   BPH (benign prostatic hyperplasia)    Cataract    bilateral   Coronary artery disease    s/p LAD intervention 2001   CVA (cerebral infarction)    Dyslipidemia    GERD (gastroesophageal reflux disease)    Hypertension    Hypothyroidism    shrank w/radiation ?1990's (06/27/2012)   Left knee DJD    Myocardial infarction (HCC)    OSA (obstructive sleep apnea) 11/29/2014   Osteoarthritis    PONV (postoperative nausea and vomiting)    Sleep apnea    cpap   Small intestinal bacterial overgrowth 07/05/2018   Stroke [facility])     SURGICAL HISTORY: Past Surgical History:  Procedure Laterality Date   angioplasty N/A    CARDIAC CATHETERIZATION  ~ 2009   CARDIOVERSION N/A 10/08/2023   Procedure: CARDIOVERSION;  Surgeon: Kate Lonni CROME, MD;  Location: [facility] INVASIVE CV LAB;  Service: Cardiovascular;  Laterality: N/A;  [id number]   CHOLECYSTECTOMY  ~ 2006   COLONOSCOPY  06/2014   CORONARY ANGIOPLASTY     CORONARY ANGIOPLASTY WITH STENT PLACEMENT  2001   1 (06/27/2012)   LOOP RECORDER IMPLANT  12-15-13   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 CATH LAB;  Service: Cardiovascular;  Laterality: N/A;   LOOP RECORDER REMOVAL N/A 03/07/2021   Procedure: LOOP RECORDER REMOVAL;  Surgeon: Cindie Ole DASEN, MD;  Location: [facility] INVASIVE CV LAB;  Service: Cardiovascular;  Laterality: N/A;   MM CORP SCREENING MAMMO BIL/CAD ([facility] HX)     PACEMAKER IMPLANT N/A 03/07/2021   Procedure: PACEMAKER IMPLANT;  Surgeon: Cindie Ole DASEN, MD;  Location: [facility] INVASIVE CV LAB;  Service: Cardiovascular;  Laterality: N/A;   PARTIAL KNEE ARTHROPLASTY  06/27/2012   Procedure: UNICOMPARTMENTAL KNEE;  Surgeon: Lamar DELENA Millman, MD;  Location: [facility] OR;  Service:  Orthopedics;  Laterality: Left;  left unicompartmental knee   SHOULDER ARTHROSCOPY WITH ROTATOR CUFF REPAIR AND SUBACROMIAL DECOMPRESSION Left 2005   (06/27/2012)   stent placement N/A    TEE WITHOUT CARDIOVERSION N/A 01/03/2014   Procedure: TRANSESOPHAGEAL ECHOCARDIOGRAM (TEE);  Surgeon: Jerel Balding, MD;  Location: [facility] ENDOSCOPY;  Service: Cardiovascular;  Laterality: N/A;    SOCIAL HISTORY: Social History   Socioeconomic History   Marital status: Married    Spouse name: Not on file   Number of children: 2   Years of education: Not on file   Highest education level: Not on file  Occupational History   Occupation: retired  Tobacco Use   Smoking status: Never   Smokeless tobacco: Never  Vaping Use   Vaping status: Never Used  Substance and Sexual Activity   Alcohol use: No    Alcohol/week: 0.0 standard drinks of alcohol   Drug use: No   Sexual activity: Yes  Other Topics Concern   Not on file  Social History Narrative   Married and retired, 2 children   Stays busy keeping up his [place] home plus he has a home in [place]  and takes care of his mother-in-law's home   No alcohol tobacco or drug use   Social Drivers of Corporate investment banker Strain: Not on file  Food Insecurity: No Food Insecurity (12/15/2022)   Hunger Vital Sign    Worried About Running Out of Food in the Last Year: Never true    Ran Out of Food in the Last Year: Never true  Transportation Needs: No Transportation Needs (12/15/2022)   PRAPARE - Administrator, Civil Service (Medical): No    Lack of Transportation (Non-Medical): No  Physical Activity: Insufficiently Active (02/08/2019)   Received from [facility] visits prior to 09/26/2022.   Exercise Vital Sign    On average, how many days per week do you engage in moderate to strenuous exercise (like a brisk walk)?: 3 days    On average, how many minutes do you engage in exercise at this level?: 30 min   Stress: Not on file  Social Connections: Not on file  Intimate Partner Violence: Patient Unable To Answer (12/15/2022)   Humiliation, Afraid, Rape, and Kick questionnaire    Fear of Current or Ex-Partner: Patient unable to answer    Emotionally Abused: Patient unable to answer    Physically Abused: Patient unable to answer    Sexually Abused: Patient unable to answer    FAMILY HISTORY: Family History  Problem Relation Age of Onset   Heart attack Mother    Heart disease Brother    Liver cancer Brother        deceased   Cancer - Lung Brother        deceased   Esophageal cancer Brother     ALLERGIES:  has no known allergies.  MEDICATIONS:  Current Outpatient Medications  Medication Sig Dispense Refill   acetaminophen  (TYLENOL ) 500 MG tablet Take 1,000 mg by mouth every 6 (six) hours as needed for mild pain (For pain in hands).     amiodarone  (PACERONE ) 200 MG tablet Take 1 tablet by mouth twice daily for 7 days and then reduce to 1 tablet by mouth daily thereafter. 105 tablet 0   chlorthalidone  (HYGROTON ) 25 MG tablet Take 1 tablet (25 mg total) by mouth daily. (Patient not taking: Reported on 03/03/2024) 100 tablet 3   Cyanocobalamin (B-12) 5000 MCG CAPS Take 5,000 mcg by mouth daily.     docusate sodium  (COLACE) 100 MG capsule Take 100 mg by mouth daily.     esomeprazole  (NEXIUM ) 40 MG capsule Take 40 mg by mouth daily.     finasteride (PROSCAR) 5 MG tablet Take 5 mg by mouth daily.     isosorbide  mononitrate (IMDUR ) 30 MG 24 hr tablet Take 0.5 tablets (15 mg total) by mouth daily. 45 tablet 1   JARDIANCE 10 MG TABS tablet Take 10 mg by mouth daily.     levothyroxine  (SYNTHROID , LEVOTHROID) 175 MCG tablet Take 175 mcg by mouth daily before breakfast.     losartan  (COZAAR ) 100 MG tablet Take 1 tablet (100 mg total) by mouth daily. (Patient not taking: Reported on 03/03/2024) 100 tablet 3   losartan  (COZAAR ) 50 MG tablet 1 tablet Orally Once a day     Omega-3 Fatty Acids ([employer] FISH  OIL) 1000 MG CAPS Take 1,000 mg by mouth daily.     ondansetron  (ZOFRAN ) 8 MG tablet 1 tablet as needed Orally Once a day; Duration: 30 days (Patient not taking: Reported on 03/03/2024)     pregabalin (LYRICA) 150 MG capsule Take 150 mg by mouth 2 (two) times daily.     rivaroxaban  (XARELTO ) 20 MG TABS tablet Take 1 tablet (20 mg total) by mouth daily with supper. 100 tablet 3   rosuvastatin  (CRESTOR ) 20 MG tablet Take 1 tablet (20 mg total) by mouth at bedtime. 100 tablet 3   Semaglutide, 1 MG/DOSE, (OZEMPIC, 1 MG/DOSE,) 4 MG/3ML SOPN Inject 1 mg into the skin once a week. (Patient taking differently: Inject 2 mg into the skin once a week.)     tamsulosin  (FLOMAX ) 0.4 MG CAPS capsule Take 0.4 mg by mouth in the morning and at bedtime.     Turmeric (QC TUMERIC COMPLEX PO) Take 700 mg by mouth.     No current facility-administered medications for this visit.    REVIEW OF SYSTEMS:   Constitutional: ( - ) fevers, ( - )  chills , ( - ) night sweats Eyes: ( - ) blurriness of vision, ( - ) double vision, ( - ) watery eyes Ears, nose, mouth, throat, and face: ( - ) mucositis, ( - ) sore throat Respiratory: ( - ) cough, ( - ) dyspnea, ( - ) wheezes Cardiovascular: ( - ) palpitation, ( - ) chest discomfort, ( - ) lower extremity swelling Gastrointestinal:  ( - ) nausea, ( - ) heartburn, ( - ) change in bowel habits Skin: ( - ) abnormal skin rashes Lymphatics: ( - ) new lymphadenopathy, ( - ) easy bruising Neurological: ( - ) numbness, ( - ) tingling, ( - ) new weaknesses Behavioral/Psych: ( - ) mood change, ( - ) new changes  All other systems were reviewed with the patient and are negative.  PHYSICAL EXAMINATION: Vitals:   03/15/24 1143  BP: 101/61  Pulse: ROLLEN)  54  Resp: 14  Temp: (!) 97.3 F (36.3 C)  SpO2: 98%     Filed Weights   03/15/24 1143  Weight: 222 lb 14.4 oz (101.1 kg)     GENERAL: Well-appearing elderly Caucasian male, alert, no distress and comfortable SKIN: skin color,  texture, turgor are normal, no rashes or significant lesions EYES: conjunctiva are pink and non-injected, sclera clear LUNGS: clear to auscultation and percussion with normal breathing effort HEART: regular rate & rhythm and no murmurs and no lower extremity edema Musculoskeletal: no cyanosis of digits and no clubbing  PSYCH: alert & oriented x 3, fluent speech NEURO: no focal motor/sensory deficits  LABORATORY DATA:  I have reviewed the data as listed    Latest Ref Rng & Units 03/15/2024   11:21 AM 10/01/2023   12:03 PM 09/15/2023    9:32 AM  CBC  WBC 4.0 - 10.5 K/uL 9.1  9.3  8.8   Hemoglobin 13.0 - 17.0 g/dL 85.4  84.8  84.6   Hematocrit 39.0 - 52.0 % 43.5  45.8  46.1   Platelets 150 - 400 K/uL 136  152  151        Latest Ref Rng & Units 03/15/2024   11:21 AM 03/03/2024    4:20 PM 10/01/2023   12:03 PM  CMP  Glucose 70 - 99 mg/dL 897   96   BUN 8 - 23 mg/dL 16   17   Creatinine 9.38 - 1.24 mg/dL 9.14   8.92   Sodium 864 - 145 mmol/L 136   134   Potassium 3.5 - 5.1 mmol/L 4.6   4.0   Chloride 98 - 111 mmol/L 104   95   CO2 22 - 32 mmol/L 28   23   Calcium  8.9 - 10.3 mg/dL 9.2   9.8   Total Protein 6.5 - 8.1 g/dL 6.3  6.2    Total Bilirubin 0.0 - 1.2 mg/dL 1.0  0.8    Alkaline Phos 38 - 126 U/L 48  50    AST 15 - 41 U/L 15  20    ALT 0 - 44 U/L 14  22     RADIOGRAPHIC STUDIES: CUP PACEART REMOTE DEVICE CHECK Result Date: 03/08/2024 PPM Scheduled remote reviewed. Normal device function.  Presenting rhythm: AS/VP. 1 AT/AF episode on 03/04/24 at 11:13 lasting 6 hrs 11 min, avg V rate 51 bpm, burden 7.2%, on Xarelto  per Epic. Next remote 91 days. [facility], [facility]   ASSESSMENT & PLAN Brandon Soto 78 y.o. male with medical history significant for SLL/CLL who presents for a follow up visit.   After review of the labs, review of the records, and discussion with the patient the patients findings are most consistent with CLL/SLL.    Previously we discussed the diagnosis of CLL and what to  expect moving forward.  We discussed that this tends to be a more indolent form of leukemia/lymphoma and may not require treatment initially.  PET CT scan has confirmed that the patient has at least stage I disease, but we will order CBC in order to confirm that he is not stage III or IV.  There does not be appear to be any splenic involvement.  The patient voices understanding of the findings, prognosis, steps moving forward for his diagnosis.   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  Patient is at least a stage I. -- prognostic panel showed the  primary mutation is deletion 13.  IGHV was not detected and ZAP70 was positive. -- labs today show white blood cell 9.1, hemoglobin 14.5, MCV 101.4, platelets 136. LFTS WNL.  -- no indication for routine images unless patient were to develop new symptoms or changes in blood counts.  -- Plan to have the patient return to clinic in 6 month's time.     No orders of the defined types were placed in this encounter.   All questions were answered. The patient knows to call the clinic with any problems, questions or concerns.  A total of more than 25 minutes were spent on this encounter with face-to-face time and non-face-to-face time, including preparing to see the patient, ordering tests and/or medications, counseling the patient and coordination of care as outlined above.   Norleen IVAR Kidney, MD Department of Hematology/Oncology [facility] Phone: [phone] Pager: [phone] Email: [email or web address]  03/15/2024 6:11 PM

## 2024-03-15 ENCOUNTER — Inpatient Hospital Stay (HOSPITAL_BASED_OUTPATIENT_CLINIC_OR_DEPARTMENT_OTHER): Payer: PPO | Admitting: Hematology and Oncology

## 2024-03-15 ENCOUNTER — Inpatient Hospital Stay: Payer: PPO | Attending: Hematology and Oncology

## 2024-03-15 VITALS — BP 101/61 | HR 54 | Temp 97.3°F | Resp 14 | Wt 222.9 lb

## 2024-03-15 DIAGNOSIS — C9110 Chronic lymphocytic leukemia of B-cell type not having achieved remission: Secondary | ICD-10-CM | POA: Diagnosis not present

## 2024-03-15 DIAGNOSIS — Z7901 Long term (current) use of anticoagulants: Secondary | ICD-10-CM | POA: Diagnosis not present

## 2024-03-15 DIAGNOSIS — Z801 Family history of malignant neoplasm of trachea, bronchus and lung: Secondary | ICD-10-CM | POA: Diagnosis not present

## 2024-03-15 DIAGNOSIS — Z79899 Other long term (current) drug therapy: Secondary | ICD-10-CM | POA: Diagnosis not present

## 2024-03-15 DIAGNOSIS — Z800 Family history of malignant neoplasm of digestive organs: Secondary | ICD-10-CM | POA: Diagnosis not present

## 2024-03-15 LAB — LACTATE DEHYDROGENASE: LDH: 137 U/L (ref 98–192)

## 2024-03-15 LAB — CBC WITH DIFFERENTIAL (CANCER CENTER ONLY)
Abs Immature Granulocytes: 0.04 K/uL (ref 0.00–0.07)
Basophils Absolute: 0 K/uL (ref 0.0–0.1)
Basophils Relative: 0 %
Eosinophils Absolute: 0.1 K/uL (ref 0.0–0.5)
Eosinophils Relative: 1 %
HCT: 43.5 % (ref 39.0–52.0)
Hemoglobin: 14.5 g/dL (ref 13.0–17.0)
Immature Granulocytes: 0 %
Lymphocytes Relative: 58 %
Lymphs Abs: 5.3 K/uL — ABNORMAL HIGH (ref 0.7–4.0)
MCH: 33.8 pg (ref 26.0–34.0)
MCHC: 33.3 g/dL (ref 30.0–36.0)
MCV: 101.4 fL — ABNORMAL HIGH (ref 80.0–100.0)
Monocytes Absolute: 0.8 K/uL (ref 0.1–1.0)
Monocytes Relative: 9 %
Neutro Abs: 2.9 K/uL (ref 1.7–7.7)
Neutrophils Relative %: 32 %
Platelet Count: 136 K/uL — ABNORMAL LOW (ref 150–400)
RBC: 4.29 MIL/uL (ref 4.22–5.81)
RDW: 13.2 % (ref 11.5–15.5)
WBC Count: 9.1 K/uL (ref 4.0–10.5)
nRBC: 0 % (ref 0.0–0.2)

## 2024-03-15 LAB — CMP (CANCER CENTER ONLY)
ALT: 14 U/L (ref 0–44)
AST: 15 U/L (ref 15–41)
Albumin: 4.4 g/dL (ref 3.5–5.0)
Alkaline Phosphatase: 48 U/L (ref 38–126)
Anion gap: 4 — ABNORMAL LOW (ref 5–15)
BUN: 16 mg/dL (ref 8–23)
CO2: 28 mmol/L (ref 22–32)
Calcium: 9.2 mg/dL (ref 8.9–10.3)
Chloride: 104 mmol/L (ref 98–111)
Creatinine: 0.85 mg/dL (ref 0.61–1.24)
GFR, Estimated: >60 mL/min (ref >60)
Glucose, Bld: 102 mg/dL — ABNORMAL HIGH (ref 70–99)
Potassium: 4.6 mmol/L (ref 3.5–5.1)
Sodium: 136 mmol/L (ref 135–145)
Total Bilirubin: 1 mg/dL (ref 0.0–1.2)
Total Protein: 6.3 g/dL — ABNORMAL LOW (ref 6.5–8.1)

## 2024-03-16 DIAGNOSIS — H40013 Open angle with borderline findings, low risk, bilateral: Secondary | ICD-10-CM | POA: Diagnosis not present

## 2024-03-16 NOTE — Telephone Encounter (Signed)
 Dr. Court , patient's chart was reviewed for preoperative cardiac evaluation.  He was seen by you on 02/23/24 and according to protocol, we request that you comment on cardiac risk for upcoming procedure since office visit was less than 2 months ago. Also, please document agreement to hold Xarelto  for 3 days given history of CVA or preference for pt to have bridge.    Please route your response to p cv div preop.  Thank you, Rosaline EMERSON Bane, NP-C 03/16/2024, 8:57 AM

## 2024-03-20 ENCOUNTER — Other Ambulatory Visit: Payer: Self-pay | Admitting: Pulmonary Disease

## 2024-03-24 NOTE — Telephone Encounter (Signed)
 Could someone reach out to [place] at orthopedics.  Patient is being told that ortho has not received the clearance.  He would like to get the surgery ASAP as he is hurting.  Looks as though we cleared him on Aug 13.    Once he gets a date, he knows to MyChart me with that so we can get the bridging sorted.   Thanks!

## 2024-03-24 NOTE — Telephone Encounter (Signed)
 Pharmacy is requesting to speak to patient.  Once he gets a date, he knows to MyChart me with that so we can get the bridging sorted.

## 2024-03-28 NOTE — Telephone Encounter (Signed)
 Resent clearance to requesting office per pharmacist due to patient stating office has not received clearance and called requesting office and left a detailed vm that clearance has been resent and will just need procedure date if it has been scheduled already

## 2024-03-29 NOTE — Telephone Encounter (Signed)
 Will send notes again to surgery scheduler Maeola Divers, to see notes that we will need a date for surgery as the pt will need Lovenox  bridging. The pharmacists are awaiting a date for surgery from surgeon office.

## 2024-03-30 MED ORDER — ENOXAPARIN SODIUM 100 MG/ML IJ SOSY: 100.0000 mg | PREFILLED_SYRINGE | Freq: Two times a day (BID) | INTRAMUSCULAR | 0 refills | Status: DC

## 2024-03-30 NOTE — Telephone Encounter (Signed)
 Spoke with patient and reviewed instructions and injection technique. Directions sent via mychart and Rx sent to pharmacy

## 2024-03-30 NOTE — Addendum Note (Signed)
 Addended by: Cyriah Childrey D on: 03/30/2024 10:00 AM   Modules accepted: Orders

## 2024-03-31 DIAGNOSIS — M1711 Unilateral primary osteoarthritis, right knee: Secondary | ICD-10-CM | POA: Diagnosis not present

## 2024-04-03 NOTE — Telephone Encounter (Addendum)
 I reviewed with Sherri Gavin if only needing the clearance for pharmacy or if need medical/cardiac clearance as well.    Per Maeola Divers: We just need to know what the bridge plan is for his Lovenox .    I will send to our preop APP Josefa Beauvais, FNP today to review notes and if pt has been cleared.

## 2024-04-03 NOTE — Telephone Encounter (Signed)
 I will fax notes to surgeon office.

## 2024-04-03 NOTE — Telephone Encounter (Signed)
 Clinical pharmacist have reviewed patient's chart and provided the following recommendations for, Brandon Soto.   9/12: Last dose of Xarelto  9/13: No Xarelto , Inject 100mg  of enoxaparin  into the abdomen (at least 2 inches away from belly button) at 8 PM. Rotate injection sites 9/14:  No Xarelto , Inject 100mg  of enoxaparin  into the abdomen at 8 AM and 8 PM. Rotate injection sites 9/15: No Xarelto , No Xarelto , Inject 100mg  of enoxaparin  into the abdomen at  8 AM. NO PM dose 9/16: Procedure Day, NO enoxaparin , NO Xarelto    Resume Xarelto  as directed by surgeon  Per Dr.Berry he may proceed with planned procedure without further cardiac testing.  [name]. [name] NP-C     04/03/2024, 1:52 PM [facility] [address] [place], [place] [address] Office [phone]

## 2024-04-04 ENCOUNTER — Encounter (HOSPITAL_COMMUNITY): Payer: Self-pay

## 2024-04-04 ENCOUNTER — Ambulatory Visit (HOSPITAL_COMMUNITY): Admit: 2024-04-04 | Admitting: Cardiology

## 2024-04-04 ENCOUNTER — Encounter: Payer: Self-pay | Admitting: Cardiology

## 2024-04-04 SURGERY — ATRIAL FIBRILLATION ABLATION
Anesthesia: General

## 2024-04-04 NOTE — Progress Notes (Signed)
 Surgical Instructions   Your procedure is scheduled on Tuesday, September 16th, 2025. Report to [facility] Main Entrance A at 10:15 A.M., then check in with the Admitting office. Any questions or running late day of surgery: call [phone]  Questions prior to your surgery date: call [phone], Monday-Friday, 8am-4pm. If you experience any cold or flu symptoms such as cough, fever, chills, shortness of breath, etc. between now and your scheduled surgery, please notify us  at the above number.     Remember:  Do not eat after midnight the night before your surgery  You may drink clear liquids until 9:15 the morning of your surgery.   Clear liquids allowed are: Water, Non-Citrus Juices (without pulp), Carbonated Beverages, Clear Tea (no milk, honey, etc.), Black Coffee Only (NO MILK, CREAM OR POWDERED CREAMER of any kind), and Gatorade.  Patient Instructions  The night before surgery:  No food after midnight. ONLY clear liquids after midnight  The day of surgery (if you do NOT have diabetes):  Drink ONE (1) Pre-Surgery Clear Ensure by 9:15 the morning of surgery. Drink in one sitting. Do not sip.  This drink was given to you during your hospital  pre-op appointment visit.  Nothing else to drink after completing the  Pre-Surgery Clear Ensure.         If you have questions, please contact your surgeon's office.     Take these medicines the morning of surgery with A SIP OF WATER: Amiodarone  (Pacerone ) Docusate Sodium  (Colace) Esomeprazole  (Nexium ) Finasteride (Proscar) Isosorbide  Mononitrate (Imdur ) Levothyroxine  (Synthroid ) Pregabalin (Lyrica) Tamsulosin  (Flomax )   May take these medicines IF NEEDED: Acetaminophen  (Tylenol )   Follow the instructions you received for Xarelto  and Lovenox  bridge.    9/12: Last dose of Xarelto  9/13: No Xarelto , Inject 100mg  of enoxaparin  into the abdomen (at least 2 inches away from belly button) at 8 PM. Rotate injection sites 9/14:   No Xarelto , Inject 100mg  of enoxaparin  into the abdomen at 8 AM and 8 PM. Rotate injection sites 9/15: No Xarelto , No Xarelto , Inject 100mg  of enoxaparin  into the abdomen at  8 AM. NO PM dose 9/16: Procedure Day, NO enoxaparin , NO Xarelto     One week prior to surgery, STOP taking any Aspirin (unless otherwise instructed by your surgeon) Aleve, Naproxen, Ibuprofen, Motrin, Advil, Goody's, BC's, all herbal medications, fish oil, and non-prescription vitamins.    WHAT DO I DO ABOUT MY DIABETES MEDICATION?   Ozempic should be stopped 7 days prior to your surgery.  Your last dose should be on or before Monday, September 8th.    Jardiance should be held for 3 days prior to surgery.  Your last dose of Jardiance should be on Friday, September 12th.        HOW TO MANAGE YOUR DIABETES BEFORE AND AFTER SURGERY  Why is it important to control my blood sugar before and after surgery? Improving blood sugar levels before and after surgery helps healing and can limit problems. A way of improving blood sugar control is eating a healthy diet by:  Eating less sugar and carbohydrates  Increasing activity/exercise  Talking with your doctor about reaching your blood sugar goals High blood sugars (greater than 180 mg/dL) can raise your risk of infections and slow your recovery, so you will need to focus on controlling your diabetes during the weeks before surgery. Make sure that the doctor who takes care of your diabetes knows about your planned surgery including the date and location.  How do I manage my blood  sugar before surgery? Check your blood sugar at least 4 times a day, starting 2 days before surgery, to make sure that the level is not too high or low.  Check your blood sugar the morning of your surgery when you wake up and every 2 hours until you get to the Short Stay unit.  If your blood sugar is less than 70 mg/dL, you will need to treat for low blood sugar: Do not take insulin. Treat a  low blood sugar (less than 70 mg/dL) with  cup of clear juice (cranberry or apple), 4 glucose tablets, OR glucose gel. Recheck blood sugar in 15 minutes after treatment (to make sure it is greater than 70 mg/dL). If your blood sugar is not greater than 70 mg/dL on recheck, call [phone] for further instructions. Report your blood sugar to the short stay nurse when you get to Short Stay.  If you are admitted to the hospital after surgery: Your blood sugar will be checked by the staff and you will probably be given insulin after surgery (instead of oral diabetes medicines) to make sure you have good blood sugar levels. The goal for blood sugar control after surgery is 80-180 mg/dL.                      Do NOT Smoke (Tobacco/Vaping) for 24 hours prior to your procedure.  If you use a CPAP at night, you may bring your mask/headgear for your overnight stay.   You will be asked to remove any contacts, glasses, piercing's, hearing aid's, dentures/partials prior to surgery. Please bring cases for these items if needed.    Patients discharged the day of surgery will not be allowed to drive home, and someone needs to stay with them for 24 hours.  SURGICAL WAITING ROOM VISITATION Patients may have no more than 2 support people in the waiting area - these visitors may rotate.   Pre-op nurse will coordinate an appropriate time for 1 ADULT support person, who may not rotate, to accompany patient in pre-op.  Children under the age of 51 must have an adult with them who is not the patient and must remain in the main waiting area with an adult.  If the patient needs to stay at the hospital during part of their recovery, the visitor guidelines for inpatient rooms apply.  Please refer to the [facility] website for the visitor guidelines for any additional information.   If you received a COVID test during your pre-op visit  it is requested that you wear a mask when out in public, stay away from anyone  that may not be feeling well and notify your surgeon if you develop symptoms. If you have been in contact with anyone that has tested positive in the last 10 days please notify you surgeon.      Pre-operative 5 CHG Bathing Instructions   You can play a key role in reducing the risk of infection after surgery. Your skin needs to be as free of germs as possible. You can reduce the number of germs on your skin by washing with CHG (chlorhexidine  gluconate) soap before surgery. CHG is an antiseptic soap that kills germs and continues to kill germs even after washing.   DO NOT use if you have an allergy to chlorhexidine /CHG or antibacterial soaps. If your skin becomes reddened or irritated, stop using the CHG and notify one of our RNs at [phone].   Please shower with the CHG soap starting  4 days before surgery using the following schedule:     Please keep in mind the following:  DO NOT shave, including legs and underarms, starting the day of your first shower.   You may shave your face at any point before/day of surgery.  Place clean sheets on your bed the day you start using CHG soap. Use a clean washcloth (not used since being washed) for each shower. DO NOT sleep with pets once you start using the CHG.   CHG Shower Instructions:  Wash your face and private area with normal soap. If you choose to wash your hair, wash first with your normal shampoo.  After you use shampoo/soap, rinse your hair and body thoroughly to remove shampoo/soap residue.  Turn the water OFF and apply about 3 tablespoons (45 ml) of CHG soap to a CLEAN washcloth.  Apply CHG soap ONLY FROM YOUR NECK DOWN TO YOUR TOES (washing for 3-5 minutes)  DO NOT use CHG soap on face, private areas, open wounds, or sores.  Pay special attention to the area where your surgery is being performed.  If you are having back surgery, having someone wash your back for you may be helpful. Wait 2 minutes after CHG soap is applied, then  you may rinse off the CHG soap.  Pat dry with a clean towel  Put on clean clothes/pajamas   If you choose to wear lotion, please use ONLY the CHG-compatible lotions that are listed below.  Additional instructions for the day of surgery: DO NOT APPLY any lotions, deodorants, cologne, or perfumes.   Do not bring valuables to the hospital. [facility] is not responsible for any belongings/valuables. Do not wear nail polish, gel polish, artificial nails, or any other type of covering on natural nails (fingers and toes) Do not wear jewelry or makeup Put on clean/comfortable clothes.  Please brush your teeth.  Ask your nurse before applying any prescription medications to the skin.     CHG Compatible Lotions   Aveeno Moisturizing lotion  Cetaphil Moisturizing Cream  Cetaphil Moisturizing Lotion  Clairol Herbal Essence Moisturizing Lotion, Dry Skin  Clairol Herbal Essence Moisturizing Lotion, Extra Dry Skin  Clairol Herbal Essence Moisturizing Lotion, Normal Skin  Curel Age Defying Therapeutic Moisturizing Lotion with Alpha Hydroxy  Curel Extreme Care Body Lotion  Curel Soothing Hands Moisturizing Hand Lotion  Curel Therapeutic Moisturizing Cream, Fragrance-Free  Curel Therapeutic Moisturizing Lotion, Fragrance-Free  Curel Therapeutic Moisturizing Lotion, Original Formula  Eucerin Daily Replenishing Lotion  Eucerin Dry Skin Therapy Plus Alpha Hydroxy Crme  Eucerin Dry Skin Therapy Plus Alpha Hydroxy Lotion  Eucerin Original Crme  Eucerin Original Lotion  Eucerin Plus Crme Eucerin Plus Lotion  Eucerin TriLipid Replenishing Lotion  Keri Anti-Bacterial Hand Lotion  Keri Deep Conditioning Original Lotion Dry Skin Formula Softly Scented  Keri Deep Conditioning Original Lotion, Fragrance Free Sensitive Skin Formula  Keri Lotion Fast Absorbing Fragrance Free Sensitive Skin Formula  Keri Lotion Fast Absorbing Softly Scented Dry Skin Formula  Keri Original Lotion  Keri Skin Renewal  Lotion Keri Silky Smooth Lotion  Keri Silky Smooth Sensitive Skin Lotion  Nivea Body Creamy Conditioning Oil  Nivea Body Extra Enriched Lotion  Nivea Body Original Lotion  Nivea Body Sheer Moisturizing Lotion Nivea Crme  Nivea Skin Firming Lotion  NutraDerm 30 Skin Lotion  NutraDerm Skin Lotion  NutraDerm Therapeutic Skin Cream  NutraDerm Therapeutic Skin Lotion  ProShield Protective Hand Cream  Provon moisturizing lotion  Please read over the following fact sheets that you  were given.

## 2024-04-04 NOTE — Progress Notes (Signed)
 PERIOPERATIVE PRESCRIPTION FOR IMPLANTED CARDIAC DEVICE PROGRAMMING  Patient Information: Name:  Brandon Soto  DOB:  [birth year]  MRN:  [id number]    Planned Procedure:  Right unicompartmental knee arthroplasty  Surgeon:  Dr. Fonda Olmsted  Date of Procedure:  04/11/24  Cautery will be used.  Position during surgery:  supine   Please send documentation back to:  [facility] (Fax # [phone])   Device Information:  Clinic EP Physician:  Dr. Ole Holts   Device Type:  Pacemaker Manufacturer and Phone #:  Medtronic: [phone] Pacemaker Dependent?:  Yes.   Date of Last Device Check:  03/07/2024  Normal Device Function?:  Yes.    Electrophysiologist's Recommendations:  Have magnet available. Provide continuous ECG monitoring when magnet is used or reprogramming is to be performed.  Procedure should not interfere with device function.  No device programming or magnet placement needed.  Per Device Clinic Standing Orders, Brandon ONEIDA Shutter, RN  3:55 PM 04/04/2024

## 2024-04-05 ENCOUNTER — Encounter (HOSPITAL_COMMUNITY): Payer: Self-pay

## 2024-04-05 ENCOUNTER — Other Ambulatory Visit: Payer: Self-pay

## 2024-04-05 ENCOUNTER — Encounter (HOSPITAL_COMMUNITY)
Admission: RE | Admit: 2024-04-05 | Discharge: 2024-04-05 | Disposition: A | Discharge status: Home / Self Care | Source: Ambulatory Visit | Attending: Orthopedic Surgery | Admitting: Orthopedic Surgery

## 2024-04-05 VITALS — BP 112/66 | HR 56 | Temp 98.0°F | Resp 18 | Ht 72.0 in | Wt 223.0 lb

## 2024-04-05 DIAGNOSIS — I480 Paroxysmal atrial fibrillation: Secondary | ICD-10-CM | POA: Insufficient documentation

## 2024-04-05 DIAGNOSIS — E119 Type 2 diabetes mellitus without complications: Secondary | ICD-10-CM | POA: Diagnosis not present

## 2024-04-05 DIAGNOSIS — I119 Hypertensive heart disease without heart failure: Secondary | ICD-10-CM | POA: Diagnosis not present

## 2024-04-05 DIAGNOSIS — I340 Nonrheumatic mitral (valve) insufficiency: Secondary | ICD-10-CM | POA: Insufficient documentation

## 2024-04-05 DIAGNOSIS — I2510 Atherosclerotic heart disease of native coronary artery without angina pectoris: Secondary | ICD-10-CM | POA: Diagnosis not present

## 2024-04-05 DIAGNOSIS — Z01812 Encounter for preprocedural laboratory examination: Secondary | ICD-10-CM | POA: Diagnosis not present

## 2024-04-05 DIAGNOSIS — E785 Hyperlipidemia, unspecified: Secondary | ICD-10-CM | POA: Diagnosis not present

## 2024-04-05 DIAGNOSIS — G4733 Obstructive sleep apnea (adult) (pediatric): Secondary | ICD-10-CM | POA: Diagnosis not present

## 2024-04-05 DIAGNOSIS — Z950 Presence of cardiac pacemaker: Secondary | ICD-10-CM | POA: Diagnosis not present

## 2024-04-05 DIAGNOSIS — Z955 Presence of coronary angioplasty implant and graft: Secondary | ICD-10-CM | POA: Insufficient documentation

## 2024-04-05 DIAGNOSIS — C9110 Chronic lymphocytic leukemia of B-cell type not having achieved remission: Secondary | ICD-10-CM | POA: Insufficient documentation

## 2024-04-05 DIAGNOSIS — E039 Hypothyroidism, unspecified: Secondary | ICD-10-CM | POA: Insufficient documentation

## 2024-04-05 DIAGNOSIS — Z8673 Personal history of transient ischemic attack (TIA), and cerebral infarction without residual deficits: Secondary | ICD-10-CM | POA: Diagnosis not present

## 2024-04-05 DIAGNOSIS — I351 Nonrheumatic aortic (valve) insufficiency: Secondary | ICD-10-CM | POA: Diagnosis not present

## 2024-04-05 DIAGNOSIS — I442 Atrioventricular block, complete: Secondary | ICD-10-CM | POA: Diagnosis not present

## 2024-04-05 DIAGNOSIS — K219 Gastro-esophageal reflux disease without esophagitis: Secondary | ICD-10-CM | POA: Insufficient documentation

## 2024-04-05 DIAGNOSIS — Z79899 Other long term (current) drug therapy: Secondary | ICD-10-CM | POA: Diagnosis not present

## 2024-04-05 DIAGNOSIS — Z7901 Long term (current) use of anticoagulants: Secondary | ICD-10-CM | POA: Insufficient documentation

## 2024-04-05 DIAGNOSIS — I252 Old myocardial infarction: Secondary | ICD-10-CM | POA: Insufficient documentation

## 2024-04-05 DIAGNOSIS — Z01818 Encounter for other preprocedural examination: Secondary | ICD-10-CM

## 2024-04-05 HISTORY — DX: Chronic lymphocytic leukemia of B-cell type not having achieved remission: C91.10

## 2024-04-05 HISTORY — DX: Malignant (primary) neoplasm, unspecified: C80.1

## 2024-04-05 HISTORY — DX: Type 2 diabetes mellitus without complications: E11.9

## 2024-04-05 HISTORY — DX: Presence of cardiac pacemaker: Z95.0

## 2024-04-05 LAB — SURGICAL PCR SCREEN
MRSA, PCR: NEGATIVE
Staphylococcus aureus: POSITIVE — AB

## 2024-04-05 LAB — GLUCOSE, CAPILLARY: Glucose-Capillary: 105 mg/dL — ABNORMAL HIGH (ref 70–99)

## 2024-04-05 NOTE — Care Plan (Signed)
 Ortho Bundle Case Management Note  Patient Details  Name: Brandon Soto MRN: [id number] Date of Birth: 02-16-1946   met with patient and wife in the office for follow up. he will discharge to home with her assistance. rolling walker ordered for home use OPPT set up [facility]. discharge instructions discussed and questions answered.  Patient and MD in agreement with plan. Choice offered.                  DME Arranged:  Vannie rolling DME Agency:  [employer]  HH Arranged:    HH Agency:     Additional Comments: Please contact me with any questions of if this plan should need to change.  Charlies Pitch,  RN,BSN,MHA,CCM  [facility]opaedic Specialist  [phone] 04/05/2024, 3:08 PM

## 2024-04-05 NOTE — Progress Notes (Signed)
 PCP - Leita Wile,MD  Cardiologist - [address] COME  PPM/ICD - Medtronic pacemaker Device Orders - received,in chart Rep Notified - emailed to Donley James  Chest x-ray - na EKG - 02/23/24 Stress Test - 11/23/33 ECHO - 12/28/23 Cardiac Cath - 2009  Sleep Study - denies CPAP -   Fasting Blood Sugar - 110-130 Checks Blood Sugar once a week  Last dose of GLP1 agonist-  03/30/24 GLP1 instructions - Ozempic should be stopped 7 days prior to your surgery.  Your last dose should be on or before Monday, September 8th.    Blood Thinner Instructions:9/12: Last dose of Xarelto  9/13: No Xarelto , Inject 100mg  of enoxaparin  into the abdomen (at least 2 inches away from belly button) at 8 PM. Rotate injection sites 9/14:  No Xarelto , Inject 100mg  of enoxaparin  into the abdomen at 8 AM and 8 PM. Rotate injection sites 9/15: No Xarelto , No Xarelto , Inject 100mg  of enoxaparin  into the abdomen at  8 AM. NO PM dose 9/16: Procedure Day, NO enoxaparin , NO Xarelto  Aspirin Instructions:na  ERAS Protcol -clear liquids until 0915 PRE-SURGERY Ensure or G2- G2  COVID TEST- na   Anesthesia review: yes - pt has pacemaker  Patient denies shortness of breath, fever, cough and chest pain at PAT appointment   All instructions explained to the patient, with a verbal understanding of the material. Patient agrees to go over the instructions while at home for a better understanding. The opportunity to ask questions was provided.

## 2024-04-05 NOTE — Progress Notes (Signed)
 Surgical Instructions   Your procedure is scheduled on Tuesday, September 16th, 2025. Report to [facility] Main Entrance A at 10:15 A.M., then check in with the Admitting office. Any questions or running late day of surgery: call [phone]  Questions prior to your surgery date: call [phone], Monday-Friday, 8am-4pm. If you experience any cold or flu symptoms such as cough, fever, chills, shortness of breath, etc. between now and your scheduled surgery, please notify us  at the above number.     Remember:  Do not eat after midnight the night before your surgery  You may drink clear liquids until 9:15 the morning of your surgery.   Clear liquids allowed are: Water, Non-Citrus Juices (without pulp), Carbonated Beverages, Clear Tea (no milk, honey, etc.), Black Coffee Only (NO MILK, CREAM OR POWDERED CREAMER of any kind), and Gatorade.  Patient Instructions  The night before surgery:  No food after midnight. ONLY clear liquids after midnight    The day of surgery (if you have diabetes): Drink ONE (1) 12 oz G2 given to you in your pre admission testing appointment by 9:15am the morning of surgery. Drink in one sitting. Do not sip.  This drink was given to you during your hospital  pre-op appointment visit.  Nothing else to drink after completing the  12 oz bottle of G2.         If you have questions, please contact your surgeon's office.     Take these medicines the morning of surgery with A SIP OF WATER: Amiodarone  (Pacerone ) Docusate Sodium  (Colace) Esomeprazole  (Nexium ) Finasteride (Proscar) Isosorbide  Mononitrate (Imdur ) Levothyroxine  (Synthroid ) Pregabalin (Lyrica) Tamsulosin  (Flomax )   May take these medicines IF NEEDED: Acetaminophen  (Tylenol )   Follow the instructions you received for Xarelto  and Lovenox  bridge.    9/12: Last dose of Xarelto  9/13: No Xarelto , Inject 100mg  of enoxaparin  into the abdomen (at least 2 inches away from belly button) at 8 PM.  Rotate injection sites 9/14:  No Xarelto , Inject 100mg  of enoxaparin  into the abdomen at 8 AM and 8 PM. Rotate injection sites 9/15: No Xarelto , No Xarelto , Inject 100mg  of enoxaparin  into the abdomen at  8 AM. NO PM dose 9/16: Procedure Day, NO enoxaparin , NO Xarelto     One week prior to surgery, STOP taking any Aspirin (unless otherwise instructed by your surgeon) Aleve, Naproxen, Ibuprofen, Motrin, Advil, Goody's, BC's, all herbal medications, fish oil, and non-prescription vitamins.    WHAT DO I DO ABOUT MY DIABETES MEDICATION?   Ozempic should be stopped 7 days prior to your surgery.  Your last dose should be on or before Monday, September 8th.    Jardiance should be held for 3 days prior to surgery.  Your last dose of Jardiance should be on Friday, September 12th.        HOW TO MANAGE YOUR DIABETES BEFORE AND AFTER SURGERY  Why is it important to control my blood sugar before and after surgery? Improving blood sugar levels before and after surgery helps healing and can limit problems. A way of improving blood sugar control is eating a healthy diet by:  Eating less sugar and carbohydrates  Increasing activity/exercise  Talking with your doctor about reaching your blood sugar goals High blood sugars (greater than 180 mg/dL) can raise your risk of infections and slow your recovery, so you will need to focus on controlling your diabetes during the weeks before surgery. Make sure that the doctor who takes care of your diabetes knows about your planned surgery including the  date and location.  How do I manage my blood sugar before surgery? Check your blood sugar at least 4 times a day, starting 2 days before surgery, to make sure that the level is not too high or low.  Check your blood sugar the morning of your surgery when you wake up and every 2 hours until you get to the Short Stay unit.  If your blood sugar is less than 70 mg/dL, you will need to treat for low blood  sugar: Do not take insulin. Treat a low blood sugar (less than 70 mg/dL) with  cup of clear juice (cranberry or apple), 4 glucose tablets, OR glucose gel. Recheck blood sugar in 15 minutes after treatment (to make sure it is greater than 70 mg/dL). If your blood sugar is not greater than 70 mg/dL on recheck, call [phone] for further instructions. Report your blood sugar to the short stay nurse when you get to Short Stay.  If you are admitted to the hospital after surgery: Your blood sugar will be checked by the staff and you will probably be given insulin after surgery (instead of oral diabetes medicines) to make sure you have good blood sugar levels. The goal for blood sugar control after surgery is 80-180 mg/dL.                      Do NOT Smoke (Tobacco/Vaping) for 24 hours prior to your procedure.  If you use a CPAP at night, you may bring your mask/headgear for your overnight stay.   You will be asked to remove any contacts, glasses, piercing's, hearing aid's, dentures/partials prior to surgery. Please bring cases for these items if needed.    Patients discharged the day of surgery will not be allowed to drive home, and someone needs to stay with them for 24 hours.  SURGICAL WAITING ROOM VISITATION Patients may have no more than 2 support people in the waiting area - these visitors may rotate.   Pre-op nurse will coordinate an appropriate time for 1 ADULT support person, who may not rotate, to accompany patient in pre-op.  Children under the age of 69 must have an adult with them who is not the patient and must remain in the main waiting area with an adult.  If the patient needs to stay at the hospital during part of their recovery, the visitor guidelines for inpatient rooms apply.  Please refer to the [facility] website for the visitor guidelines for any additional information.   If you received a COVID test during your pre-op visit  it is requested that you wear a mask  when out in public, stay away from anyone that may not be feeling well and notify your surgeon if you develop symptoms. If you have been in contact with anyone that has tested positive in the last 10 days please notify you surgeon.      Pre-operative 5 CHG Bathing Instructions   You can play a key role in reducing the risk of infection after surgery. Your skin needs to be as free of germs as possible. You can reduce the number of germs on your skin by washing with CHG (chlorhexidine  gluconate) soap before surgery. CHG is an antiseptic soap that kills germs and continues to kill germs even after washing.   DO NOT use if you have an allergy to chlorhexidine /CHG or antibacterial soaps. If your skin becomes reddened or irritated, stop using the CHG and notify one of our RNs at  [phone].   Please shower with the CHG soap starting 4 days before surgery using the following schedule:     Please keep in mind the following:  DO NOT shave, including legs and underarms, starting the day of your first shower.   You may shave your face at any point before/day of surgery.  Place clean sheets on your bed the day you start using CHG soap. Use a clean washcloth (not used since being washed) for each shower. DO NOT sleep with pets once you start using the CHG.   CHG Shower Instructions:  Wash your face and private area with normal soap. If you choose to wash your hair, wash first with your normal shampoo.  After you use shampoo/soap, rinse your hair and body thoroughly to remove shampoo/soap residue.  Turn the water OFF and apply about 3 tablespoons (45 ml) of CHG soap to a CLEAN washcloth.  Apply CHG soap ONLY FROM YOUR NECK DOWN TO YOUR TOES (washing for 3-5 minutes)  DO NOT use CHG soap on face, private areas, open wounds, or sores.  Pay special attention to the area where your surgery is being performed.  If you are having back surgery, having someone wash your back for you may be helpful. Wait 2  minutes after CHG soap is applied, then you may rinse off the CHG soap.  Pat dry with a clean towel  Put on clean clothes/pajamas   If you choose to wear lotion, please use ONLY the CHG-compatible lotions that are listed below.  Additional instructions for the day of surgery: DO NOT APPLY any lotions, deodorants, cologne, or perfumes.   Do not bring valuables to the hospital. [facility] is not responsible for any belongings/valuables. Do not wear nail polish, gel polish, artificial nails, or any other type of covering on natural nails (fingers and toes) Do not wear jewelry or makeup Put on clean/comfortable clothes.  Please brush your teeth.  Ask your nurse before applying any prescription medications to the skin.     CHG Compatible Lotions   Aveeno Moisturizing lotion  Cetaphil Moisturizing Cream  Cetaphil Moisturizing Lotion  Clairol Herbal Essence Moisturizing Lotion, Dry Skin  Clairol Herbal Essence Moisturizing Lotion, Extra Dry Skin  Clairol Herbal Essence Moisturizing Lotion, Normal Skin  Curel Age Defying Therapeutic Moisturizing Lotion with Alpha Hydroxy  Curel Extreme Care Body Lotion  Curel Soothing Hands Moisturizing Hand Lotion  Curel Therapeutic Moisturizing Cream, Fragrance-Free  Curel Therapeutic Moisturizing Lotion, Fragrance-Free  Curel Therapeutic Moisturizing Lotion, Original Formula  Eucerin Daily Replenishing Lotion  Eucerin Dry Skin Therapy Plus Alpha Hydroxy Crme  Eucerin Dry Skin Therapy Plus Alpha Hydroxy Lotion  Eucerin Original Crme  Eucerin Original Lotion  Eucerin Plus Crme Eucerin Plus Lotion  Eucerin TriLipid Replenishing Lotion  Keri Anti-Bacterial Hand Lotion  Keri Deep Conditioning Original Lotion Dry Skin Formula Softly Scented  Keri Deep Conditioning Original Lotion, Fragrance Free Sensitive Skin Formula  Keri Lotion Fast Absorbing Fragrance Free Sensitive Skin Formula  Keri Lotion Fast Absorbing Softly Scented Dry Skin Formula  Keri  Original Lotion  Keri Skin Renewal Lotion Keri Silky Smooth Lotion  Keri Silky Smooth Sensitive Skin Lotion  Nivea Body Creamy Conditioning Oil  Nivea Body Extra Enriched Teacher, adult education Moisturizing Lotion Nivea Crme  Nivea Skin Firming Lotion  NutraDerm 30 Skin Lotion  NutraDerm Skin Lotion  NutraDerm Therapeutic Skin Cream  NutraDerm Therapeutic Skin Lotion  ProShield Protective Hand Cream  Provon moisturizing lotion  Please read over the following fact sheets that you were given.

## 2024-04-06 NOTE — Progress Notes (Signed)
 Anesthesia Chart Review:  78 yo male follows with cardiology for hx of  CHB s/p PPM, pAF on Xarelto  and amiodarone , HTN, HLD, CAD s/p anterior MI treated with LAD PCI in 2001, CVA, OSA on CPAP. Nuclear stress 10/2023 consistent with prior LAD infarction, no current ischemia. Echo 12/2023 with LVEF 45-50%, stable WMAs, normal RV, mild-mod MR, mild AI. Last seen in EP followup by Daphne Barrack, NP on 03/03/24. Preop clearance per primary cardiologist Dr. Court in telephone encounter 03/16/24, Cleared for the knee replacement. Given prior CVA should prob have bridge after holding Xarelto . Lovenox  bridge is outlined in telephone encounter by Josefa Beauvais, NP on 04/03/24.  Other pertinent hx includes PONV, hypothyroidism, CLL, GERD on PPI, NIDDM2 (A1c 5.5 on 12/29/23).  Pt reports LD Ozempic 03/30/24.  CMP and CBC 03/15/24 reviewed, unremarkable.  EKG 02/23/24: Atrial-sensed ventricular-paced rhythm. Rate 60.  Perioperative prescription for implanted cardiac device programming per progress note 04/04/24: Device Information:   Clinic EP Physician:  Dr. Ole Holts    Device Type:  Pacemaker Manufacturer and Phone #:  Medtronic: [phone] Pacemaker Dependent?:  Yes.   Date of Last Device Check:  03/07/2024      Normal Device Function?:  Yes.     Electrophysiologist's Recommendations:   Have magnet available. Provide continuous ECG monitoring when magnet is used or reprogramming is to be performed.  Procedure should not interfere with device function.  No device programming or magnet placement needed.  TTE 12/3023: 1. Calcified trabeculation apex. Thrombus less likely. Definity  contrast  utilized. Left ventricular ejection fraction, by estimation, is 45 to 50%.  The left ventricle has mildly decreased function. The left ventricle  demonstrates regional wall motion  abnormalities (see scoring diagram/findings for description). There is  mild left ventricular hypertrophy of the septal segment.  Left ventricular  diastolic parameters were normal.   2. Right ventricular systolic function is normal. The right ventricular  size is mildly enlarged. There is normal pulmonary artery systolic  pressure. The estimated right ventricular systolic pressure is 25.8 mmHg.   3. Left atrial size was mildly dilated.   4. Right atrial size was mildly dilated.   5. The mitral valve is normal in structure. Mild to moderate mitral valve  regurgitation. No evidence of mitral stenosis.   6. Tricuspid valve regurgitation is moderate.   7. The aortic valve is tricuspid. There is mild calcification of the  aortic valve. Aortic valve regurgitation is mild. No aortic stenosis is  present.   8. The inferior vena cava is normal in size with greater than 50%  respiratory variability, suggesting right atrial pressure of 3 mmHg.   Nuclear stress 11/24/23:   Findings are consistent with LAD infarction. The study is intermediate risk due to large fixed perfusion defect and mildly reduced calculated ejection fraction though this calculation may be affected by gating error. Correlate with echo.   No ST deviation was noted.   LV perfusion is abnormal. There is no evidence of ischemia. There is evidence of infarction. Defect 1: There is a large defect with severe reduction in uptake present in the apical to basal anterior and apex location(s) that is fixed. There is abnormal wall motion in the defect area. Consistent with infarction.   Left ventricular function is abnormal. Global function is mildly reduced. Nuclear stress EF: 45%. The left ventricular ejection fraction is mildly decreased (45-54%). End diastolic cavity size is moderately enlarged. End systolic cavity size is mildly enlarged. No evidence of transient ischemic dilation (TID)  noted.   Coronary calcium  assessment not performed due to prior revascularization.   Prior study available for comparison from 04/27/2012. No changes in perfusion compared to prior  study.    Lynwood Geofm RIGGERS [facility]/Anesthesiology Phone [phone] 04/06/2024 2:03 PM

## 2024-04-06 NOTE — Anesthesia Preprocedure Evaluation (Addendum)
 Anesthesia Evaluation  Patient identified by MRN, date of birth, ID band Patient awake    Reviewed: Allergy & Precautions, NPO status , Patient's Chart, lab work & pertinent test results  History of Anesthesia Complications (+) PONV and history of anesthetic complications  Airway Mallampati: III  TM Distance: >3 FB Neck ROM: Full    Dental  (+) Missing   Pulmonary sleep apnea and Continuous Positive Airway Pressure Ventilation    Pulmonary exam normal        Cardiovascular hypertension, + angina  + CAD, + Past MI, + Cardiac Stents (x 1) and + Peripheral Vascular Disease  Normal cardiovascular exam+ dysrhythmias Atrial Fibrillation + pacemaker      Neuro/Psych  Neuromuscular disease CVA    GI/Hepatic Neg liver ROS,GERD  Medicated and Controlled,,  Endo/Other  diabetes, Oral Hypoglycemic AgentsHypothyroidism    Renal/GU negative Renal ROS     Musculoskeletal  (+) Arthritis ,    Abdominal   Peds  Hematology  (+) Blood dyscrasia   Anesthesia Other Findings Primary osteoarthritis of right knee  Reproductive/Obstetrics                              Anesthesia Physical Anesthesia Plan  ASA: 3  Anesthesia Plan: Regional and Spinal   Post-op Pain Management:    Induction:   PONV Risk Score and Plan: 2 and Ondansetron , Dexamethasone , Propofol  infusion and Treatment may vary due to age or medical condition  Airway Management Planned: Simple Face Mask  Additional Equipment:   Intra-op Plan:   Post-operative Plan:   Informed Consent: I have reviewed the patients History and Physical, chart, labs and discussed the procedure including the risks, benefits and alternatives for the proposed anesthesia with the patient or authorized representative who has indicated his/her understanding and acceptance.     Dental advisory given  Plan Discussed with: CRNA  Anesthesia Plan Comments: (PAT  note by Lynwood Hope, PA-C: 78 yo male follows with cardiology for hx of  CHB s/p PPM, pAF on Xarelto  and amiodarone , HTN, HLD, CAD s/p anterior MI treated with LAD PCI in 2001, CVA, OSA on CPAP. Nuclear stress 10/2023 consistent with prior LAD infarction, no current ischemia. Echo 12/2023 with LVEF 45-50%, stable WMAs, normal RV, mild-mod MR, mild AI. Last seen in EP followup by Daphne Barrack, NP on 03/03/24. Preop clearance per primary cardiologist Dr. Court in telephone encounter 03/16/24, Cleared for the knee replacement. Given prior CVA should prob have bridge after holding Xarelto . Lovenox  bridge is outlined in telephone encounter by Josefa Beauvais, NP on 04/03/24.  Other pertinent hx includes PONV, hypothyroidism, CLL, GERD on PPI, NIDDM2 (A1c 5.5 on 12/29/23).  Pt reports LD Ozempic 03/30/24.  CMP and CBC 03/15/24 reviewed, unremarkable.  EKG 02/23/24: Atrial-sensed ventricular-paced rhythm. Rate 60.  Perioperative prescription for implanted cardiac device programming per progress note 04/04/24: Device Information:   Clinic EP Physician:  Dr. Ole Holts    Device Type:  Pacemaker Manufacturer and Phone #:  Medtronic: [phone] Pacemaker Dependent?:  Yes.   Date of Last Device Check:  03/07/2024      Normal Device Function?:  Yes.     Electrophysiologist's Recommendations:    Have magnet available.  Provide continuous ECG monitoring when magnet is used or reprogramming is to be performed.   Procedure should not interfere with device function.  No device programming or magnet placement needed.  TTE 12/3023: 1. Calcified trabeculation apex. Thrombus less likely.  Definity  contrast  utilized. Left ventricular ejection fraction, by estimation, is 45 to 50%.  The left ventricle has mildly decreased function. The left ventricle  demonstrates regional wall motion  abnormalities (see scoring diagram/findings for description). There is  mild left ventricular hypertrophy of the septal  segment. Left ventricular  diastolic parameters were normal.   2. Right ventricular systolic function is normal. The right ventricular  size is mildly enlarged. There is normal pulmonary artery systolic  pressure. The estimated right ventricular systolic pressure is 25.8 mmHg.   3. Left atrial size was mildly dilated.   4. Right atrial size was mildly dilated.   5. The mitral valve is normal in structure. Mild to moderate mitral valve  regurgitation. No evidence of mitral stenosis.   6. Tricuspid valve regurgitation is moderate.   7. The aortic valve is tricuspid. There is mild calcification of the  aortic valve. Aortic valve regurgitation is mild. No aortic stenosis is  present.   8. The inferior vena cava is normal in size with greater than 50%  respiratory variability, suggesting right atrial pressure of 3 mmHg.   Nuclear stress 11/24/23:   Findings are consistent with LAD infarction. The study is intermediate risk due to large fixed perfusion defect and mildly reduced calculated ejection fraction though this calculation may be affected by gating error. Correlate with echo.   No ST deviation was noted.   LV perfusion is abnormal. There is no evidence of ischemia. There is evidence of infarction. Defect 1: There is a large defect with severe reduction in uptake present in the apical to basal anterior and apex location(s) that is fixed. There is abnormal wall motion in the defect area. Consistent with infarction.   Left ventricular function is abnormal. Global function is mildly reduced. Nuclear stress EF: 45%. The left ventricular ejection fraction is mildly decreased (45-54%). End diastolic cavity size is moderately enlarged. End systolic cavity size is mildly enlarged. No evidence of transient ischemic dilation (TID) noted.   Coronary calcium  assessment not performed due to prior revascularization.   Prior study available for comparison from 04/27/2012. No changes in perfusion compared  to prior study.  )         Anesthesia Quick Evaluation

## 2024-04-10 NOTE — H&P (Signed)
 KNEE ARTHROPLASTY ADMISSION H&P  Patient ID: Brandon Soto MRN: [id number] DOB/AGE: 1946-04-10 78 y.o.  Chief Complaint: right knee pain.  Planned Procedure Date: 04/11/24 Medical Clearance by Dr. Stephane   Cardiac Clearance by Dr. Court Additional clearance by Dr. Federico (oncology), Device clearance    HPI: Brandon Soto is a 78 y.o. male who presents for evaluation of osteoarthritis of right knee. The patient has a history of pain and functional disability in the right knee due to arthritis and has failed non-surgical conservative treatments for greater than 12 weeks to include corticosteriod injections, viscosupplementation injections, and activity modification.  Onset of symptoms was abrupt, starting 1 years ago with rapidlly worsening course since that time. The patient noted no past surgery on the right knee.  Patient currently rates pain at 9 out of 10 with activity. Patient has worsening of pain with activity and weight bearing and pain that interferes with activities of daily living.  Patient has evidence of joint space narrowing by imaging studies.  There is no active infection.  Past Medical History:  Diagnosis Date   BPH (benign prostatic hyperplasia)    Cancer (HCC)    Prostate cancer   Cataract    bilateral   CLL (chronic lymphocytic leukemia) (HCC)    Coronary artery disease    s/p LAD intervention 2001   CVA (cerebral infarction)    Diabetes mellitus without complication (HCC)    Dyslipidemia    GERD (gastroesophageal reflux disease)    Hypertension    Hypothyroidism    shrank w/radiation ?1990's (06/27/2012)   Left knee DJD    Myocardial infarction (HCC)    OSA (obstructive sleep apnea) 11/29/2014   Osteoarthritis    PONV (postoperative nausea and vomiting)    Presence of permanent cardiac pacemaker    Sleep apnea    cpap   Small intestinal bacterial overgrowth 07/05/2018   [facility])    Past Surgical History:  Procedure Laterality Date   angioplasty N/A     BACK SURGERY     CARDIAC CATHETERIZATION  ~ 2009   CARDIOVERSION N/A 10/08/2023   Procedure: CARDIOVERSION;  Surgeon: Kate Lonni CROME, MD;  Location: [facility] INVASIVE CV LAB;  Service: Cardiovascular;  Laterality: N/A;  [id number]   CHOLECYSTECTOMY  ~ 2006   COLONOSCOPY  06/26/2014   CORONARY ANGIOPLASTY     CORONARY ANGIOPLASTY WITH STENT PLACEMENT  07/28/1999   1 (06/27/2012)   EYE SURGERY     cataracts   FRACTURE SURGERY Left    clavicle   LOOP RECORDER IMPLANT  12/15/2013   MDT LinQ implanted by Dr Kelsie for cryptogenic stroke   LOOP RECORDER IMPLANT N/A 12/15/2013   Procedure: LOOP RECORDER IMPLANT;  Surgeon: Lynwood JONETTA Kelsie, MD;  Location: [facility] CATH LAB;  Service: Cardiovascular;  Laterality: N/A;   LOOP RECORDER REMOVAL N/A 03/07/2021   Procedure: LOOP RECORDER REMOVAL;  Surgeon: Cindie Ole DASEN, MD;  Location: [facility] INVASIVE CV LAB;  Service: Cardiovascular;  Laterality: N/A;   MM [employer]P SCREENING MAMMO BIL/CAD ([facility] HX)     PACEMAKER IMPLANT N/A 03/07/2021   Procedure: PACEMAKER IMPLANT;  Surgeon: Cindie Ole DASEN, MD;  Location: [facility] INVASIVE CV LAB;  Service: Cardiovascular;  Laterality: N/A;   PARTIAL KNEE ARTHROPLASTY  06/27/2012   Procedure: UNICOMPARTMENTAL KNEE;  Surgeon: Lamar DELENA Millman, MD;  Location: [facility] OR;  Service: Orthopedics;  Laterality: Left;  left unicompartmental knee   SHOULDER ARTHROSCOPY WITH ROTATOR CUFF REPAIR AND SUBACROMIAL DECOMPRESSION Left 07/28/2003   (06/27/2012)  stent placement N/A    TEE WITHOUT CARDIOVERSION N/A 01/03/2014   Procedure: TRANSESOPHAGEAL ECHOCARDIOGRAM (TEE);  Surgeon: Jerel Balding, MD;  Location: [facility] ENDOSCOPY;  Service: Cardiovascular;  Laterality: N/A;   No Known Allergies Prior to Admission medications   Medication Sig Start Date End Date Taking? Authorizing Provider  acetaminophen  (TYLENOL ) 500 MG tablet Take 1,000 mg by mouth every 6 (six) hours as needed for mild pain (For pain in hands).   Yes [provider]   amiodarone  (PACERONE ) 200 MG tablet Take 1 tablet by mouth twice daily for 7 days and then reduce to 1 tablet by mouth daily thereafter. 03/03/24  Yes Ollis, Brandi L, NP  Cyanocobalamin (B-12) 5000 MCG CAPS Take 5,000 mcg by mouth daily. 02/01/20  Yes [provider]  docusate sodium  (COLACE) 100 MG capsule Take 100 mg by mouth daily.   Yes [provider]  esomeprazole  (NEXIUM ) 40 MG capsule Take 40 mg by mouth daily.   Yes [provider]  finasteride  (PROSCAR ) 5 MG tablet Take 5 mg by mouth daily.   Yes [provider]  isosorbide  mononitrate (IMDUR ) 30 MG 24 hr tablet Take 0.5 tablets (15 mg total) by mouth daily. 01/25/24 04/24/24 Yes Ollis, Brandi L, NP  JARDIANCE  10 MG TABS tablet Take 10 mg by mouth daily. 02/27/21  Yes [provider]  levothyroxine  (SYNTHROID , LEVOTHROID) 175 MCG tablet Take 175 mcg by mouth daily before breakfast.   Yes [provider]  losartan  (COZAAR ) 50 MG tablet Take 50 mg by mouth daily. 02/22/24  Yes [provider]  Omega-3 Fatty Acids ([employer] FISH OIL) 1000 MG CAPS Take 1,000 mg by mouth daily. 02/01/20  Yes [provider]  OZEMPIC, 2 MG/DOSE, 8 MG/3ML SOPN Inject 2 mg into the skin once a week. 03/18/24  Yes [provider]  polyethylene glycol (MIRALAX  / GLYCOLAX ) 17 g packet Take 17 g by mouth daily.   Yes [provider]  pregabalin  (LYRICA ) 150 MG capsule Take 150 mg by mouth 2 (two) times daily. 09/09/22  Yes [provider]  rivaroxaban  (XARELTO ) 20 MG TABS tablet Take 1 tablet (20 mg total) by mouth daily with supper. 08/02/23  Yes Court Dorn PARAS, MD  rosuvastatin  (CRESTOR ) 20 MG tablet Take 1 tablet (20 mg total) by mouth at bedtime. 08/02/23  Yes Court Dorn PARAS, MD  tamsulosin  (FLOMAX ) 0.4 MG CAPS capsule Take 0.4 mg by mouth in the morning and at bedtime. 09/23/20  Yes [provider]  Turmeric (QC TUMERIC COMPLEX PO) Take 700 mg by mouth daily.   Yes  [provider]  chlorthalidone  (HYGROTON ) 25 MG tablet Take 1 tablet (25 mg total) by mouth daily. Patient not taking: No sig reported 08/02/23   Court Dorn PARAS, MD  enoxaparin  (LOVENOX ) 100 MG/ML injection Inject 1 mL (100 mg total) into the skin every 12 (twelve) hours. 03/30/24   Court Dorn PARAS, MD  losartan  (COZAAR ) 100 MG tablet Take 1 tablet (100 mg total) by mouth daily. Patient not taking: No sig reported 08/02/23   Court Dorn PARAS, MD   Social History   Socioeconomic History   Marital status: Married    Spouse name: Not on file   Number of children: 2   Years of education: Not on file   Highest education level: Not on file  Occupational History   Occupation: retired  Tobacco Use   Smoking status: Never   Smokeless tobacco: Never  Vaping Use   Vaping status:  Never Used  Substance and Sexual Activity   Alcohol use: No    Alcohol/week: 0.0 standard drinks of alcohol   Drug use: No   Sexual activity: Yes  Other Topics Concern   Not on file  Social History Narrative   Married and retired, 2 children   Stays busy keeping up his [place] home plus he has a home in [place]  and takes care of his mother-in-law's home   No alcohol tobacco or drug use   Social Drivers of Corporate investment banker Strain: Not on file  Food Insecurity: No Food Insecurity (12/15/2022)   Hunger Vital Sign    Worried About Running Out of Food in the Last Year: Never true    Ran Out of Food in the Last Year: Never true  Transportation Needs: No Transportation Needs (12/15/2022)   PRAPARE - Administrator, Civil Service (Medical): No    Lack of Transportation (Non-Medical): No  Physical Activity: Insufficiently Active (02/08/2019)   [facility] from [facility] visits prior to 09/26/2022.   Exercise Vital Sign    On average, how many days per week do you engage in moderate to strenuous exercise (like a brisk walk)?: 3 days    On average, how  many minutes do you engage in exercise at this level?: 30 min  Stress: Not on file  Social Connections: Not on file   Family History  Problem Relation Age of Onset   Heart attack Mother    Heart disease Brother    Liver cancer Brother        deceased   Cancer - Lung Brother        deceased   Esophageal cancer Brother     ROS: Currently denies lightheadedness, dizziness, Fever, chills, CP, SOB.   No personal history of DVT, PE. + MI, CVA. No loose teeth or dentures All other systems have been reviewed and were otherwise currently negative with the exception of those mentioned in the HPI and as above.  Objective: Vitals: Vitals: Height 6 feet, weight 226 pounds, BP 113/66, pulse 56, temperature 97.4, O2 95% on room air.   Physical Exam: General: Alert, NAD.  Antalgic Gait  HEENT: EOMI, Good Neck Extension  Pulm: No increased work of breathing.  Clear B/L A/P w/o crackle or wheeze.  CV: RRR, No m/g/r appreciated  GI: soft, NT, ND Neuro: Neuro without gross focal deficit.  Sensation intact distally Skin: No lesions in the area of chief complaint MSK/Surgical Site: right knee w/o redness or effusion.  He has 5-100 degrees of range of motion of the right knee.  Positive medial joint line tenderness.  No lateral joint line tenderness.  EHL and FHL are intact.  Distal sensation is intact, 2+ PT pulse. Stable varus and valgus stress.    Imaging Review Plain radiographs demonstrate severe degenerative joint disease of the right knee.   Preoperative templating of the joint replacement has been completed, documented, and submitted to the Operating Room personnel in order to optimize intra-operative equipment management.  Assessment: Right anteromedial knee osteoarthritis   Plan: Plan for Procedure(s): ARTHROPLASTY, KNEE, UNICOMPARTMENTAL  The patient history, physical exam, clinical judgement of the provider and imaging are consistent with end stage degenerative joint disease and  unicompartmental joint arthroplasty is deemed medically necessary. The treatment options including medical management, injection therapy, and arthroplasty were discussed at length. The risks and benefits of Procedure(s): ARTHROPLASTY, KNEE, UNICOMPARTMENTAL were presented and reviewed.  The risks  of nonoperative treatment, versus surgical intervention including but not limited to continued pain, aseptic loosening, stiffness, dislocation/subluxation, infection, bleeding, nerve injury, blood clots, cardiopulmonary complications, morbidity, mortality, among others were discussed. The patient verbalizes understanding and wishes to proceed with the plan.  Patient is being admitted for inpatient treatment for surgery, pain control, PT, prophylactic antibiotics, VTE prophylaxis, progressive ambulation, ADL's and discharge planning.   The patient does not  meet the criteria for TXA which will be used perioperatively.   Home Xarelto  will be used postoperatively for DVT prophylaxis in addition to SCDs, and early ambulation.    Carlyon Nolasco K Freddi Schrager, PA-C 04/10/2024 12:19 PM

## 2024-04-10 NOTE — Progress Notes (Signed)
 Patient was called to be informed that the surgery time for tomorrow was changed to 10:00 o'clock. Patient was instructed to be at the hospital at 08:00 o'clock, and to stop drinking clear liquids at 07:00 o'clock. Patient verbalized understanding.

## 2024-04-11 ENCOUNTER — Encounter (HOSPITAL_COMMUNITY)
Admission: RE | Disposition: A | Discharge status: Home / Self Care | Payer: Self-pay | Source: Home / Self Care | Attending: Orthopedic Surgery

## 2024-04-11 ENCOUNTER — Observation Stay (HOSPITAL_COMMUNITY)

## 2024-04-11 ENCOUNTER — Encounter (HOSPITAL_COMMUNITY): Payer: Self-pay | Admitting: Orthopedic Surgery

## 2024-04-11 ENCOUNTER — Other Ambulatory Visit: Payer: Self-pay

## 2024-04-11 ENCOUNTER — Ambulatory Visit (HOSPITAL_COMMUNITY): Payer: Self-pay | Admitting: Physician Assistant

## 2024-04-11 ENCOUNTER — Ambulatory Visit (HOSPITAL_COMMUNITY): Admitting: Certified Registered"

## 2024-04-11 ENCOUNTER — Observation Stay (HOSPITAL_COMMUNITY)
Admission: RE | Admit: 2024-04-11 | Discharge: 2024-04-12 | Disposition: A | Discharge status: Home / Self Care | Attending: Orthopedic Surgery | Admitting: Orthopedic Surgery

## 2024-04-11 DIAGNOSIS — Z471 Aftercare following joint replacement surgery: Secondary | ICD-10-CM | POA: Diagnosis not present

## 2024-04-11 DIAGNOSIS — G8918 Other acute postprocedural pain: Secondary | ICD-10-CM | POA: Diagnosis not present

## 2024-04-11 DIAGNOSIS — Z96652 Presence of left artificial knee joint: Secondary | ICD-10-CM | POA: Insufficient documentation

## 2024-04-11 DIAGNOSIS — I25119 Atherosclerotic heart disease of native coronary artery with unspecified angina pectoris: Secondary | ICD-10-CM | POA: Diagnosis not present

## 2024-04-11 DIAGNOSIS — M1711 Unilateral primary osteoarthritis, right knee: Principal | ICD-10-CM | POA: Insufficient documentation

## 2024-04-11 DIAGNOSIS — E039 Hypothyroidism, unspecified: Secondary | ICD-10-CM | POA: Insufficient documentation

## 2024-04-11 DIAGNOSIS — Z8673 Personal history of transient ischemic attack (TIA), and cerebral infarction without residual deficits: Secondary | ICD-10-CM | POA: Diagnosis not present

## 2024-04-11 DIAGNOSIS — Z79899 Other long term (current) drug therapy: Secondary | ICD-10-CM | POA: Diagnosis not present

## 2024-04-11 DIAGNOSIS — I10 Essential (primary) hypertension: Secondary | ICD-10-CM | POA: Insufficient documentation

## 2024-04-11 DIAGNOSIS — Z856 Personal history of leukemia: Secondary | ICD-10-CM | POA: Diagnosis not present

## 2024-04-11 DIAGNOSIS — Z96659 Presence of unspecified artificial knee joint: Secondary | ICD-10-CM

## 2024-04-11 DIAGNOSIS — Z7984 Long term (current) use of oral hypoglycemic drugs: Secondary | ICD-10-CM | POA: Insufficient documentation

## 2024-04-11 DIAGNOSIS — R609 Edema, unspecified: Secondary | ICD-10-CM | POA: Diagnosis not present

## 2024-04-11 DIAGNOSIS — I2510 Atherosclerotic heart disease of native coronary artery without angina pectoris: Secondary | ICD-10-CM | POA: Diagnosis not present

## 2024-04-11 DIAGNOSIS — Z8546 Personal history of malignant neoplasm of prostate: Secondary | ICD-10-CM | POA: Insufficient documentation

## 2024-04-11 DIAGNOSIS — Z96651 Presence of right artificial knee joint: Principal | ICD-10-CM

## 2024-04-11 DIAGNOSIS — E1136 Type 2 diabetes mellitus with diabetic cataract: Secondary | ICD-10-CM | POA: Insufficient documentation

## 2024-04-11 DIAGNOSIS — M25561 Pain in right knee: Secondary | ICD-10-CM | POA: Diagnosis present

## 2024-04-11 LAB — GLUCOSE, CAPILLARY
Glucose-Capillary: 107 mg/dL — ABNORMAL HIGH (ref 70–99)
Glucose-Capillary: 151 mg/dL — ABNORMAL HIGH (ref 70–99)
Glucose-Capillary: 91 mg/dL (ref 70–99)
Glucose-Capillary: 101 mg/dL — ABNORMAL HIGH (ref 70–99)

## 2024-04-11 SURGERY — ARTHROPLASTY, KNEE, UNICOMPARTMENTAL
Anesthesia: Regional | Site: Knee | Laterality: Right

## 2024-04-11 MED ORDER — ONDANSETRON HCL 4 MG PO TABS
4.0000 mg | ORAL_TABLET | Freq: Four times a day (QID) | ORAL | Status: DC | PRN
  Administered 2024-04-12: 4 mg via ORAL
  Filled 2024-04-11: qty 1

## 2024-04-11 MED ORDER — DEXMEDETOMIDINE HCL IN NACL 200 MCG/50ML IV SOLN
INTRAVENOUS | Status: DC | PRN
  Administered 2024-04-11 (×2): 4 ug via INTRAVENOUS

## 2024-04-11 MED ORDER — MENTHOL 3 MG MT LOZG: 1.0000 | LOZENGE | OROMUCOSAL | Status: DC | PRN

## 2024-04-11 MED ORDER — ONDANSETRON HCL 4 MG/2ML IJ SOLN
4.0000 mg | Freq: Four times a day (QID) | INTRAMUSCULAR | Status: DC | PRN
  Administered 2024-04-11 – 2024-04-12 (×3): 4 mg via INTRAVENOUS
  Filled 2024-04-11 (×2): qty 2

## 2024-04-11 MED ORDER — ACETAMINOPHEN 500 MG PO TABS
1000.0000 mg | ORAL_TABLET | Freq: Four times a day (QID) | ORAL | Status: DC
  Administered 2024-04-11 – 2024-04-12 (×2): 1000 mg via ORAL
  Filled 2024-04-11 (×2): qty 2

## 2024-04-11 MED ORDER — ROSUVASTATIN CALCIUM 20 MG PO TABS
20.0000 mg | ORAL_TABLET | Freq: Every day | ORAL | Status: DC
  Administered 2024-04-11: 20 mg via ORAL
  Filled 2024-04-11: qty 1

## 2024-04-11 MED ORDER — GLYCOPYRROLATE PF 0.2 MG/ML IJ SOSY
PREFILLED_SYRINGE | INTRAMUSCULAR | Status: DC | PRN
Start: 2024-04-11 — End: 2024-04-11
  Administered 2024-04-11: .2 mg via INTRAVENOUS

## 2024-04-11 MED ORDER — OXYCODONE HCL 5 MG PO TABS
5.0000 mg | ORAL_TABLET | ORAL | Status: DC | PRN
  Administered 2024-04-11 – 2024-04-12 (×3): 10 mg via ORAL
  Filled 2024-04-11 (×2): qty 2

## 2024-04-11 MED ORDER — METOCLOPRAMIDE HCL 5 MG PO TABS: 5.0000 mg | ORAL_TABLET | Freq: Three times a day (TID) | ORAL | Status: DC | PRN

## 2024-04-11 MED ORDER — OXYCODONE HCL 5 MG PO TABS: 10.0000 mg | ORAL_TABLET | ORAL | Status: DC | PRN

## 2024-04-11 MED ORDER — FENTANYL CITRATE PF 50 MCG/ML IJ SOSY
50.0000 ug | PREFILLED_SYRINGE | Freq: Once | INTRAMUSCULAR | Status: AC
  Administered 2024-04-11: 50 ug via INTRAVENOUS
  Filled 2024-04-11: qty 1

## 2024-04-11 MED ORDER — SENNA 8.6 MG PO TABS
1.0000 | ORAL_TABLET | Freq: Two times a day (BID) | ORAL | Status: DC
  Administered 2024-04-11 – 2024-04-12 (×2): 8.6 mg via ORAL
  Filled 2024-04-11 (×2): qty 1

## 2024-04-11 MED ORDER — FINASTERIDE 5 MG PO TABS
5.0000 mg | ORAL_TABLET | Freq: Every day | ORAL | Status: DC
  Administered 2024-04-12: 5 mg via ORAL
  Filled 2024-04-11: qty 1

## 2024-04-11 MED ORDER — AMIODARONE HCL 200 MG PO TABS
200.0000 mg | ORAL_TABLET | Freq: Every day | ORAL | Status: DC
  Administered 2024-04-12: 200 mg via ORAL
  Filled 2024-04-11: qty 1

## 2024-04-11 MED ORDER — PHENYLEPHRINE HCL-NACL 20-0.9 MG/250ML-% IV SOLN
INTRAVENOUS | Status: DC | PRN
  Administered 2024-04-11: 50 ug/min via INTRAVENOUS

## 2024-04-11 MED ORDER — BUPIVACAINE-EPINEPHRINE (PF) 0.5% -1:200000 IJ SOLN
INTRAMUSCULAR | Status: DC | PRN
  Administered 2024-04-11: 30 mL via PERINEURAL

## 2024-04-11 MED ORDER — EPHEDRINE SULFATE-NACL 50-0.9 MG/10ML-% IV SOSY
PREFILLED_SYRINGE | INTRAVENOUS | Status: DC | PRN
  Administered 2024-04-11: 10 mg via INTRAVENOUS

## 2024-04-11 MED ORDER — ORAL CARE MOUTH RINSE: 15.0000 mL | Freq: Once | OROMUCOSAL | Status: AC

## 2024-04-11 MED ORDER — TAMSULOSIN HCL 0.4 MG PO CAPS
0.4000 mg | ORAL_CAPSULE | Freq: Two times a day (BID) | ORAL | Status: DC
  Administered 2024-04-11 – 2024-04-12 (×2): 0.4 mg via ORAL
  Filled 2024-04-11 (×2): qty 1

## 2024-04-11 MED ORDER — RIVAROXABAN 20 MG PO TABS: 20.0000 mg | ORAL_TABLET | Freq: Every day | ORAL | Status: DC

## 2024-04-11 MED ORDER — MUPIROCIN 2 % EX OINT: 1.0000 | TOPICAL_OINTMENT | Freq: Two times a day (BID) | CUTANEOUS | 0 refills | Status: AC

## 2024-04-11 MED ORDER — AMISULPRIDE (ANTIEMETIC) 5 MG/2ML IV SOLN: 10.0000 mg | Freq: Once | INTRAVENOUS | Status: DC | PRN

## 2024-04-11 MED ORDER — OXYCODONE HCL 5 MG PO TABS
ORAL_TABLET | ORAL | Status: AC
  Filled 2024-04-11: qty 2

## 2024-04-11 MED ORDER — FENTANYL CITRATE (PF) 100 MCG/2ML IJ SOLN: 25.0000 ug | INTRAMUSCULAR | Status: DC | PRN

## 2024-04-11 MED ORDER — ONDANSETRON HCL 4 MG/2ML IJ SOLN: 4.0000 mg | Freq: Once | INTRAMUSCULAR | Status: DC | PRN

## 2024-04-11 MED ORDER — SODIUM CHLORIDE 0.9 % IR SOLN
Status: DC | PRN
  Administered 2024-04-11: 1000 mL

## 2024-04-11 MED ORDER — DEXMEDETOMIDINE HCL IN NACL 80 MCG/20ML IV SOLN
INTRAVENOUS | Status: AC
  Filled 2024-04-11: qty 20

## 2024-04-11 MED ORDER — 0.9 % SODIUM CHLORIDE (POUR BTL) OPTIME
TOPICAL | Status: DC | PRN
  Administered 2024-04-11: 1000 mL

## 2024-04-11 MED ORDER — PREGABALIN 75 MG PO CAPS
150.0000 mg | ORAL_CAPSULE | Freq: Two times a day (BID) | ORAL | Status: DC
  Administered 2024-04-11 – 2024-04-12 (×2): 150 mg via ORAL
  Filled 2024-04-11 (×2): qty 2

## 2024-04-11 MED ORDER — FENTANYL CITRATE (PF) 100 MCG/2ML IJ SOLN
INTRAMUSCULAR | Status: AC
  Filled 2024-04-11: qty 2

## 2024-04-11 MED ORDER — ACETAMINOPHEN 10 MG/ML IV SOLN
INTRAVENOUS | Status: AC
  Filled 2024-04-11: qty 100

## 2024-04-11 MED ORDER — CEFAZOLIN SODIUM-DEXTROSE 2-4 GM/100ML-% IV SOLN
2.0000 g | Freq: Four times a day (QID) | INTRAVENOUS | Status: AC
  Administered 2024-04-11 – 2024-04-12 (×2): 2 g via INTRAVENOUS
  Filled 2024-04-11 (×2): qty 100

## 2024-04-11 MED ORDER — POTASSIUM CHLORIDE IN NACL 20-0.9 MEQ/L-% IV SOLN
INTRAVENOUS | Status: DC
  Filled 2024-04-11: qty 1000

## 2024-04-11 MED ORDER — EMPAGLIFLOZIN 10 MG PO TABS
10.0000 mg | ORAL_TABLET | Freq: Every day | ORAL | Status: DC
  Administered 2024-04-12: 10 mg via ORAL
  Filled 2024-04-11: qty 1

## 2024-04-11 MED ORDER — BUPIVACAINE HCL (PF) 0.25 % IJ SOLN
INTRAMUSCULAR | Status: AC
  Filled 2024-04-11: qty 10

## 2024-04-11 MED ORDER — PHENOL 1.4 % MT LIQD: 1.0000 | OROMUCOSAL | Status: DC | PRN

## 2024-04-11 MED ORDER — BISACODYL 10 MG RE SUPP: 10.0000 mg | Freq: Every day | RECTAL | Status: DC | PRN

## 2024-04-11 MED ORDER — INSULIN ASPART 100 UNIT/ML IJ SOLN: 0.0000 [IU] | INTRAMUSCULAR | Status: DC | PRN

## 2024-04-11 MED ORDER — DOCUSATE SODIUM 100 MG PO CAPS
100.0000 mg | ORAL_CAPSULE | Freq: Every day | ORAL | Status: DC
  Administered 2024-04-11 – 2024-04-12 (×2): 100 mg via ORAL
  Filled 2024-04-11 (×2): qty 1

## 2024-04-11 MED ORDER — DIPHENHYDRAMINE HCL 12.5 MG/5ML PO ELIX: 12.5000 mg | ORAL_SOLUTION | ORAL | Status: DC | PRN

## 2024-04-11 MED ORDER — ONDANSETRON HCL 4 MG/2ML IJ SOLN
INTRAMUSCULAR | Status: AC
  Filled 2024-04-11: qty 2

## 2024-04-11 MED ORDER — FENTANYL CITRATE (PF) 250 MCG/5ML IJ SOLN
INTRAMUSCULAR | Status: AC
  Filled 2024-04-11: qty 5

## 2024-04-11 MED ORDER — CHLORHEXIDINE GLUCONATE 0.12 % MT SOLN
15.0000 mL | Freq: Once | OROMUCOSAL | Status: AC
  Administered 2024-04-11: 15 mL via OROMUCOSAL
  Filled 2024-04-11: qty 15

## 2024-04-11 MED ORDER — CHLORHEXIDINE GLUCONATE 4 % EX SOLN: 1.0000 | CUTANEOUS | 1 refills | Status: AC

## 2024-04-11 MED ORDER — LACTATED RINGERS IV SOLN: INTRAVENOUS | Status: DC

## 2024-04-11 MED ORDER — GLYCOPYRROLATE PF 0.2 MG/ML IJ SOSY
PREFILLED_SYRINGE | INTRAMUSCULAR | Status: AC
  Filled 2024-04-11: qty 1

## 2024-04-11 MED ORDER — ACETAMINOPHEN 325 MG PO TABS: 325.0000 mg | ORAL_TABLET | Freq: Four times a day (QID) | ORAL | Status: DC | PRN

## 2024-04-11 MED ORDER — BUPIVACAINE IN DEXTROSE 0.75-8.25 % IT SOLN
INTRATHECAL | Status: DC | PRN
  Administered 2024-04-11: 2 mL via INTRATHECAL

## 2024-04-11 MED ORDER — ACETAMINOPHEN 500 MG PO TABS
1000.0000 mg | ORAL_TABLET | Freq: Once | ORAL | Status: DC
  Filled 2024-04-11: qty 2

## 2024-04-11 MED ORDER — CEFAZOLIN SODIUM-DEXTROSE 2-4 GM/100ML-% IV SOLN
2.0000 g | INTRAVENOUS | Status: AC
  Administered 2024-04-11: 2 g via INTRAVENOUS
  Filled 2024-04-11: qty 100

## 2024-04-11 MED ORDER — LOSARTAN POTASSIUM 50 MG PO TABS
50.0000 mg | ORAL_TABLET | Freq: Every day | ORAL | Status: DC
  Administered 2024-04-12: 50 mg via ORAL
  Filled 2024-04-11: qty 1

## 2024-04-11 MED ORDER — POVIDONE-IODINE 10 % EX SWAB: 2.0000 | Freq: Once | CUTANEOUS | Status: AC

## 2024-04-11 MED ORDER — POLYETHYLENE GLYCOL 3350 17 G PO PACK
17.0000 g | PACK | Freq: Every day | ORAL | Status: DC
  Administered 2024-04-11 – 2024-04-12 (×2): 17 g via ORAL
  Filled 2024-04-11 (×2): qty 1

## 2024-04-11 MED ORDER — MAGNESIUM HYDROXIDE 400 MG/5ML PO SUSP: 30.0000 mL | Freq: Every day | ORAL | Status: DC | PRN

## 2024-04-11 MED ORDER — ALUM & MAG HYDROXIDE-SIMETH 200-200-20 MG/5ML PO SUSP: 30.0000 mL | ORAL | Status: DC | PRN

## 2024-04-11 MED ORDER — POVIDONE-IODINE 10 % EX SWAB
2.0000 | Freq: Once | CUTANEOUS | Status: AC
  Administered 2024-04-11: 2 via TOPICAL

## 2024-04-11 MED ORDER — BUPIVACAINE HCL (PF) 0.25 % IJ SOLN
INTRAMUSCULAR | Status: AC
  Filled 2024-04-11: qty 20

## 2024-04-11 MED ORDER — METHOCARBAMOL 1000 MG/10ML IJ SOLN: 500.0000 mg | Freq: Four times a day (QID) | INTRAMUSCULAR | Status: DC | PRN

## 2024-04-11 MED ORDER — ISOSORBIDE MONONITRATE ER 30 MG PO TB24
15.0000 mg | ORAL_TABLET | Freq: Every day | ORAL | Status: DC
  Administered 2024-04-12: 15 mg via ORAL
  Filled 2024-04-11: qty 1

## 2024-04-11 MED ORDER — HYDROMORPHONE HCL 1 MG/ML IJ SOLN
INTRAMUSCULAR | Status: AC
  Filled 2024-04-11: qty 1

## 2024-04-11 MED ORDER — HYDROMORPHONE HCL 1 MG/ML IJ SOLN
0.5000 mg | INTRAMUSCULAR | Status: DC | PRN
  Administered 2024-04-11 – 2024-04-12 (×3): 1 mg via INTRAVENOUS
  Filled 2024-04-11 (×2): qty 1

## 2024-04-11 MED ORDER — MAGNESIUM CITRATE PO SOLN: 1.0000 | Freq: Once | ORAL | Status: DC | PRN

## 2024-04-11 MED ORDER — LEVOTHYROXINE SODIUM 75 MCG PO TABS
175.0000 ug | ORAL_TABLET | Freq: Every day | ORAL | Status: DC
  Administered 2024-04-12: 175 ug via ORAL
  Filled 2024-04-11: qty 1

## 2024-04-11 MED ORDER — ACETAMINOPHEN 10 MG/ML IV SOLN
1000.0000 mg | Freq: Once | INTRAVENOUS | Status: DC | PRN
  Administered 2024-04-11: 1000 mg via INTRAVENOUS

## 2024-04-11 MED ORDER — PROPOFOL 10 MG/ML IV BOLUS
INTRAVENOUS | Status: DC | PRN
  Administered 2024-04-11 (×4): 20 mg via INTRAVENOUS

## 2024-04-11 MED ORDER — ONDANSETRON HCL 4 MG/2ML IJ SOLN
INTRAMUSCULAR | Status: DC | PRN
  Administered 2024-04-11: 4 mg via INTRAVENOUS

## 2024-04-11 MED ORDER — FENTANYL CITRATE (PF) 250 MCG/5ML IJ SOLN
INTRAMUSCULAR | Status: DC | PRN
  Administered 2024-04-11 (×2): 50 ug via INTRAVENOUS

## 2024-04-11 MED ORDER — PROPOFOL 500 MG/50ML IV EMUL
INTRAVENOUS | Status: DC | PRN
  Administered 2024-04-11: 80 ug/kg/min via INTRAVENOUS
  Administered 2024-04-11: 100 ug/kg/min via INTRAVENOUS

## 2024-04-11 MED ORDER — PROPOFOL 1000 MG/100ML IV EMUL
INTRAVENOUS | Status: AC
  Filled 2024-04-11: qty 100

## 2024-04-11 MED ORDER — METHOCARBAMOL 500 MG PO TABS
ORAL_TABLET | ORAL | Status: AC
  Filled 2024-04-11: qty 1

## 2024-04-11 MED ORDER — METHOCARBAMOL 500 MG PO TABS
500.0000 mg | ORAL_TABLET | Freq: Four times a day (QID) | ORAL | Status: DC | PRN
  Administered 2024-04-11 – 2024-04-12 (×2): 500 mg via ORAL
  Filled 2024-04-11: qty 1

## 2024-04-11 MED ORDER — METOCLOPRAMIDE HCL 5 MG/ML IJ SOLN: 5.0000 mg | Freq: Three times a day (TID) | INTRAMUSCULAR | Status: DC | PRN

## 2024-04-11 MED ORDER — POVIDONE-IODINE 7.5 % EX SOLN
Freq: Once | CUTANEOUS | Status: DC
  Filled 2024-04-11: qty 118

## 2024-04-11 MED ORDER — PANTOPRAZOLE SODIUM 40 MG PO TBEC
80.0000 mg | DELAYED_RELEASE_TABLET | Freq: Every day | ORAL | Status: DC
  Administered 2024-04-12: 80 mg via ORAL
  Filled 2024-04-11: qty 2

## 2024-04-11 SURGICAL SUPPLY — 46 items
BAG COUNTER SPONGE SURGICOUNT (BAG) ×1 IMPLANT
BANDAGE ESMARK 6X9 LF (GAUZE/BANDAGES/DRESSINGS) ×1 IMPLANT
BEARING MENISCAL TIBIAL 4 MD R (Orthopedic Implant) IMPLANT
BNDG ELASTIC 6X15 VLCR STRL LF (GAUZE/BANDAGES/DRESSINGS) ×1 IMPLANT
BOWL SMART MIX CTS (DISPOSABLE) ×1 IMPLANT
CEMENT BONE R 1X40 (Cement) IMPLANT
CLSR STERI-STRIP ANTIMIC 1/2X4 (GAUZE/BANDAGES/DRESSINGS) ×1 IMPLANT
COVER SURGICAL LIGHT HANDLE (MISCELLANEOUS) ×1 IMPLANT
CUFF TRNQT CYL 34X4.125X (TOURNIQUET CUFF) ×1 IMPLANT
DRAPE EXTREMITY T 121X128X90 (DISPOSABLE) IMPLANT
DRAPE U-SHAPE 47X51 STRL (DRAPES) ×1 IMPLANT
DRSG MEPILEX POST OP 4X12 (GAUZE/BANDAGES/DRESSINGS) IMPLANT
DRSG MEPILEX POST OP 4X8 (GAUZE/BANDAGES/DRESSINGS) ×1 IMPLANT
DURAPREP 26ML APPLICATOR (WOUND CARE) ×1 IMPLANT
ELECT CAUTERY BLADE 6.4 (BLADE) ×1 IMPLANT
ELECTRODE REM PT RTRN 9FT ADLT (ELECTROSURGICAL) ×1 IMPLANT
GLOVE BIO SURGEON STRL SZ7 (GLOVE) ×2 IMPLANT
GLOVE BIOGEL PI IND STRL 7.0 (GLOVE) ×1 IMPLANT
GLOVE ORTHO TXT STRL SZ7.5 (GLOVE) ×1 IMPLANT
GOWN STRL REUS W/ TWL LRG LVL3 (GOWN DISPOSABLE) ×1 IMPLANT
GOWN STRL REUS W/ TWL XL LVL3 (GOWN DISPOSABLE) ×1 IMPLANT
HOOD PEEL AWAY T7 (MISCELLANEOUS) ×1 IMPLANT
HOOD W/PEELAWAY (MISCELLANEOUS) ×1 IMPLANT
IMMOBILIZER KNEE 22 UNIV (SOFTGOODS) ×1 IMPLANT
KIT BASIN OR (CUSTOM PROCEDURE TRAY) ×1 IMPLANT
KIT TURNOVER KIT B (KITS) ×1 IMPLANT
MANIFOLD NEPTUNE II (INSTRUMENTS) ×1 IMPLANT
NDL HYPO 21X1.5 SAFETY (NEEDLE) IMPLANT
NEEDLE HYPO 21X1.5 SAFETY (NEEDLE) ×1 IMPLANT
NS IRRIG 1000ML POUR BTL (IV SOLUTION) ×1 IMPLANT
PACK BLADE SAW RECIP 70 3 PT (BLADE) IMPLANT
PACK TOTAL JOINT (CUSTOM PROCEDURE TRAY) ×1 IMPLANT
PAD ABD 8X10 STRL (GAUZE/BANDAGES/DRESSINGS) IMPLANT
PAD ARMBOARD POSITIONER FOAM (MISCELLANEOUS) ×2 IMPLANT
PEG TWIN FEM CEMENTED MED (Knees) IMPLANT
PENCIL BUTTON HOLSTER BLD 10FT (ELECTRODE) IMPLANT
SET HNDPC FAN SPRY TIP SCT (DISPOSABLE) ×1 IMPLANT
STRIP CLOSURE SKIN 1/2X4 (GAUZE/BANDAGES/DRESSINGS) ×1 IMPLANT
SUCTION TUBE FRAZIER 10FR DISP (SUCTIONS) ×1 IMPLANT
SUT VIC AB 0 CT1 27XBRD ANBCTR (SUTURE) ×1 IMPLANT
SUT VIC AB 1 CT1 27XBRD ANBCTR (SUTURE) ×1 IMPLANT
SUT VIC AB 3-0 SH 8-18 (SUTURE) ×1 IMPLANT
SYR CONTROL 10ML LL (SYRINGE) IMPLANT
TOWEL GREEN STERILE (TOWEL DISPOSABLE) ×1 IMPLANT
TRAY TIBIAL OXFORD SZ C RT (Joint) IMPLANT
TUBE SUCT ARGYLE STRL (TUBING) IMPLANT

## 2024-04-11 NOTE — Plan of Care (Signed)
  Problem: Pain Managment: Goal: General experience of comfort will improve and/or be controlled Outcome: Progressing   Problem: Safety: Goal: Ability to remain free from injury will improve Outcome: Progressing   Problem: Skin Integrity: Goal: Risk for impaired skin integrity will decrease Outcome: Progressing

## 2024-04-11 NOTE — Transfer of Care (Signed)
 Immediate Anesthesia Transfer of Care Note  Patient: Brandon Soto  Procedure(s) Performed: ARTHROPLASTY, KNEE, UNICOMPARTMENTAL (Right: Knee)  Patient Location: PACU  Anesthesia Type:MAC, Regional, and Spinal  Level of Consciousness: awake, drowsy, patient cooperative, and responds to stimulation  Airway & Oxygen Therapy: Patient Spontanous Breathing and Patient connected to face mask oxygen  Post-op Assessment: Report given to RN and Post -op Vital signs reviewed and stable  Post vital signs: Reviewed and stable  Last Vitals:  Vitals Value Taken Time  BP 100/52 04/11/24 13:15  Temp    Pulse 69 04/11/24 13:23  Resp 17 04/11/24 13:23  SpO2 93 % 04/11/24 13:23  Vitals shown include unfiled device data.  Last Pain:  Vitals:   04/11/24 0828  TempSrc:   PainSc: 5          Complications: No notable events documented.

## 2024-04-11 NOTE — Anesthesia Procedure Notes (Signed)
 Anesthesia Regional Block: Adductor canal block   Pre-Anesthetic Checklist: , timeout performed,  Correct Patient, Correct Site, Correct Laterality,  Correct Procedure,, site marked,  Risks and benefits discussed,  Surgical consent,  Pre-op evaluation,  At surgeon's request and post-op pain management  Laterality: Right  Prep: chloraprep       Needles:  Injection technique: Single-shot  Needle Type: Echogenic Stimulator Needle     Needle Length: 10cm  Needle Gauge: 20     Additional Needles:   Procedures:,,,, ultrasound used (permanent image in chart),,    Narrative:  Start time: 04/11/2024 9:10 AM End time: 04/11/2024 9:20 AM Injection made incrementally with aspirations every 5 mL.  Performed by: Personally  Anesthesiologist: Patrisha Bernardino SQUIBB, MD  Additional Notes: Functioning IV was confirmed and monitors were applied. A time-out was performed. Hand hygiene and sterile gloves were used. The thigh was placed in a frog-leg position and prepped in a sterile fashion. A 20ga Bbraun echogenic stimulator needle was placed using ultrasound guidance.  Negative aspiration and negative test dose prior to incremental administration of local anesthetic. The patient tolerated the procedure well.

## 2024-04-11 NOTE — Op Note (Signed)
 04/11/2024  12:45 PM  PATIENT:  Brandon Soto    PRE-OPERATIVE DIAGNOSIS: Right anteromedial knee osteoarthritis  POST-OPERATIVE DIAGNOSIS:  Same  PROCEDURE: RIGHT unicompartmental Knee Arthroplasty  SURGEON:  [name], MD  PHYSICIAN ASSISTANT: Army Daring, PA-C, present and scrubbed throughout the case, critical for completion in a timely fashion, and for retraction, instrumentation, and closure.  ANESTHESIA:   Spinal  ESTIMATED BLOOD LOSS: 150 mL  UNIQUE ASPECTS OF THE CASE: Initially I thought the tibial cut was going to be a little bit deep, and so I cut off of the top of the 2 mm captured guide, but ultimately I ended up taking a second cut, and I was oscillating between a size 3 and a size 4 throughout the steps of the case, and ultimately the 4 felt appropriate.  I did cut the fence twice, the medial lateral dimensions were a lot more narrow than the front to back dimensions, and the size B was too small front to back, a size C probably over home just a little bit in the posterior medial, but felt appropriate.  PREOPERATIVE INDICATIONS:  Brandon Soto is a  78 y.o. male with a diagnosis of anteromedial knee osteoarthritis who failed conservative measures and elected for surgical management.    The risks benefits and alternatives were discussed with the patient preoperatively including but not limited to the risks of infection, bleeding, nerve injury, cardiopulmonary complications, blood clots, the need for revision surgery, among others, and the patient was willing to proceed.  OPERATIVE IMPLANTS: Implant Name: CEMENT BONE R 1X40 - [id number] Type: Cement Inv. Item: CEMENT BONE R 1X40 Serial No.:  Manufacturer: [employer](ORTH,TRAU,BIO,SG) Lot No.: [id number] LRB: Right No. Used: 1 Action: Implanted   Implant Name: TRAY TIBIAL OXFORD SZ C RT - [id number] Type: Joint Inv. Item: TRAY TIBIAL OXFORD SZ C RT Serial No.:  Manufacturer: [employer]  [employer](ORTH,TRAU,BIO,SG) Lot No.: [id number] LRB: Right No. Used: 1 Action: Implanted   Implant Name: PEG TWIN FEM CEMENTED MED - [id number] Type: Knees Inv. Item: PEG TWIN FEM CEMENTED MED Serial No.:  Manufacturer: [employer](ORTH,TRAU,BIO,SG) Lot No.: [id number] LRB: Right No. Used: 1 Action: Implanted   Implant Name: BEARING MENISCAL TIBIAL 4 MD R - [id number] Type: Orthopedic Implant Inv. Item: BEARING MENISCAL TIBIAL 4 MD R Serial No.:  Manufacturer: [employer](ORTH,TRAU,BIO,SG) Lot No.: [id number] LRB: Right No. Used: 1 Action: Implanted   OPERATIVE FINDINGS: Endstage grade 4 medial compartment osteoarthritis. No significant changes in the lateral or patellofemoral joint.  The ACL was intact.  OPERATIVE PROCEDURE: The patient was brought to the operating room placed in the supine position. Anesthesia was administered. IV antibiotics were given. The lower extremity was placed in the legholder and prepped and draped in usual sterile fashion.  Time out was performed.  The leg was elevated and exsanguinated and the tourniquet was inflated. Anteromedial incision was performed, and I took care to preserve the MCL. Parapatellar incision was carried out, and the osteophytes were excised, along with the medial meniscus and a small portion of the fat pad.  The extra medullary tibial cutting jig was applied, using the spoon and the 4mm G-Clamp and the 2 mm shim, and I took care to protect the anterior cruciate ligament insertion and the tibial spine. The medial collateral ligament was also protected, and I resected my proximal tibia, matching the anatomic slope.   The proximal tibial bony cut was removed in one piece, and I turned my attention to the femur.  The intramedullary femoral rod was placed using the drill, and then using the appropriate reference, I assembled the femoral jig, setting my posterior cutting block. I resected my posterior femur, used the 0 spigot for the  anterior femur, and then measured my gap.   I then used the appropriate mill to match the extension gap to the flexion gap. The second milling was at a 4.  The gaps were then measured again with the appropriate feeler gauges. Once I had balanced flexion and extension gaps, I then completed the preparation of the femur.  I milled off the anterior aspect of the distal femur to prevent impingement. I also exposed the tibia, and selected the above-named component, and then used the cutting jig to prepare the keel slot on the tibia. I also used the awl to curette out the bone to complete the preparation of the keel. The back wall was intact.  I then placed trial components, and it was found to have excellent motion, and appropriate balance.  I then cemented the components into place, cementing the tibia first, removing all excess cement, and then cementing the femur.  All loose cement was removed.  The real polyethylene insert was applied manually, and the knee was taken through functional range of motion, and found to have excellent stability and restoration of joint motion, with excellent balance.  The wounds were irrigated copiously, and the parapatellar tissue closed with Stratafix and Vicryl, followed by Vicryl for the subcutaneous tissue, with routine closure with Steri-Strips and sterile gauze.  The tourniquet was released, and the patient was awakened and extubated and returned to PACU in stable and satisfactory condition. There were no complications.

## 2024-04-11 NOTE — Discharge Instructions (Signed)

## 2024-04-11 NOTE — Anesthesia Procedure Notes (Signed)
 Spinal  Patient location during procedure: OR Start time: 04/11/2024 10:50 AM End time: 04/11/2024 10:55 AM Reason for block: surgical anesthesia Staffing Performed: anesthesiologist  Anesthesiologist: Patrisha Bernardino SQUIBB, MD Performed by: Patrisha Bernardino SQUIBB, MD Authorized by: Patrisha Bernardino SQUIBB, MD   Preanesthetic Checklist Completed: patient identified, IV checked, risks and benefits discussed, surgical consent, monitors and equipment checked, pre-op evaluation and timeout performed Spinal Block Patient position: sitting Prep: DuraPrep Patient monitoring: cardiac monitor, continuous pulse ox and blood pressure Approach: midline Location: L4-5 Injection technique: single-shot Needle Needle type: Pencan  Needle gauge: 24 G Needle length: 9 cm Assessment Sensory level: T10 Events: CSF return Additional Notes Functioning IV was confirmed and monitors were applied. Sterile prep and drape, including hand hygiene and sterile gloves were used. The patient was positioned and the spine was prepped. The skin was anesthetized with lidocaine .  Free flow of clear CSF was obtained on the second attempt prior to injecting local anesthetic into the CSF.  The spinal needle aspirated freely following injection.  The needle was carefully withdrawn.  The patient tolerated the procedure well.

## 2024-04-11 NOTE — Interval H&P Note (Signed)
 History and Physical Interval Note:  04/11/2024 9:55 AM  Brandon Soto  has presented today for surgery, with the diagnosis of osteoarthritis of right knee.  The various methods of treatment have been discussed with the patient and family. After consideration of risks, benefits and other options for treatment, the patient has consented to  Procedure(s): ARTHROPLASTY, KNEE, UNICOMPARTMENTAL (Right) as a surgical intervention.  The patient's history has been reviewed, patient examined, no change in status, stable for surgery.  I have reviewed the patient's chart and labs.  Questions were answered to the patient's satisfaction.     [name]

## 2024-04-12 ENCOUNTER — Encounter (HOSPITAL_COMMUNITY): Payer: Self-pay | Admitting: Orthopedic Surgery

## 2024-04-12 DIAGNOSIS — M1711 Unilateral primary osteoarthritis, right knee: Secondary | ICD-10-CM | POA: Diagnosis not present

## 2024-04-12 LAB — CBC
HCT: 38.1 % — ABNORMAL LOW (ref 39.0–52.0)
Hemoglobin: 12.3 g/dL — ABNORMAL LOW (ref 13.0–17.0)
MCH: 33.4 pg (ref 26.0–34.0)
MCHC: 32.3 g/dL (ref 30.0–36.0)
MCV: 103.5 fL — ABNORMAL HIGH (ref 80.0–100.0)
Platelets: 116 K/uL — ABNORMAL LOW (ref 150–400)
RBC: 3.68 MIL/uL — ABNORMAL LOW (ref 4.22–5.81)
RDW: 13.3 % (ref 11.5–15.5)
WBC: 7.4 K/uL (ref 4.0–10.5)
nRBC: 0 % (ref 0.0–0.2)

## 2024-04-12 LAB — BASIC METABOLIC PANEL WITH GFR
Anion gap: 7 (ref 5–15)
BUN: 13 mg/dL (ref 8–23)
CO2: 26 mmol/L (ref 22–32)
Calcium: 8.6 mg/dL — ABNORMAL LOW (ref 8.9–10.3)
Chloride: 104 mmol/L (ref 98–111)
Creatinine, Ser: 0.98 mg/dL (ref 0.61–1.24)
GFR, Estimated: >60 mL/min (ref >60)
Glucose, Bld: 127 mg/dL — ABNORMAL HIGH (ref 70–99)
Potassium: 4.1 mmol/L (ref 3.5–5.1)
Sodium: 137 mmol/L (ref 135–145)

## 2024-04-12 LAB — HEMOGLOBIN A1C
Hgb A1c MFr Bld: 5.2 % (ref 4.8–5.6)
Mean Plasma Glucose: 102.54 mg/dL

## 2024-04-12 MED ORDER — INSULIN ASPART 100 UNIT/ML IJ SOLN: 0.0000 [IU] | Freq: Three times a day (TID) | INTRAMUSCULAR | Status: DC

## 2024-04-12 MED ORDER — MORPHINE SULFATE (PF) 2 MG/ML IV SOLN: 0.5000 mg | INTRAVENOUS | Status: DC | PRN

## 2024-04-12 MED ORDER — ONDANSETRON 4 MG PO TBDP: 4.0000 mg | ORAL_TABLET | Freq: Three times a day (TID) | ORAL | 0 refills | Status: AC | PRN

## 2024-04-12 MED ORDER — HYDROCODONE-ACETAMINOPHEN 5-325 MG PO TABS: 1.0000 | ORAL_TABLET | ORAL | Status: DC | PRN

## 2024-04-12 MED ORDER — HYDROCODONE-ACETAMINOPHEN 10-325 MG PO TABS: 1.0000 | ORAL_TABLET | ORAL | 0 refills | Status: AC | PRN

## 2024-04-12 MED ORDER — HYDROCODONE-ACETAMINOPHEN 7.5-325 MG PO TABS
1.0000 | ORAL_TABLET | ORAL | Status: DC | PRN
  Administered 2024-04-12: 2 via ORAL
  Filled 2024-04-12: qty 2

## 2024-04-12 NOTE — Progress Notes (Signed)
 Physical Therapy Treatment Patient Details Name: Brandon Soto MRN: [id number] DOB: 22-May-1946 Today's Date: 04/12/2024   History of Present Illness Brandon Soto is a 78 y.o. male who presented to [facility] hospital 04/11/24 with a diagnosis of anteromedial knee osteoarthritis who failed conservative measures and elected for surgical management. Pt s/p right unicompartmental knee arthroplasty 9/16. PMHx: BPH, prostate CA, CAD s/p LAD, CVA, DM, GERD, HTN, MI, OSA, and OA.    PT Comments  Pt greeted supine in bed, pleasant and agreeable to PT session. Educated pt on stair training in accordance with his home set-up. Instructed pt to utilize a step-to pattern leading with LLE when going up and leading with RLE when going down. He completed two stairs twice once with unilateral UE support on railing advancing to HHA by PT. Discussed how his family should be position to support him at home. Pt increased gait distance, ambulating ~159ft using RW with supervision. He demonstrated a step-through antalgic gait pattern. Reviewed RLE HEP. Patient and wife feel ready and safe for discharge home with OPPT.  I have answered all their questions related to mobility.      If plan is discharge home, recommend the following: A little help with walking and/or transfers;A little help with bathing/dressing/bathroom;Assistance with cooking/housework;Assist for transportation;Help with stairs or ramp for entrance   Can travel by private vehicle        Equipment Recommendations  Rolling walker (2 wheels)    Recommendations for Other Services       Precautions / Restrictions Precautions Precautions: Knee;Fall Precaution Booklet Issued: Yes (comment) Recall of Precautions/Restrictions: Impaired Precaution/Restrictions Comments: Reviewed no pillow/blanket/etc placed under the bend of the operative total knee. Pt greeted with the bed knees fully raised resulting in bend in knee. Encouraged use of blue bone  foam. Restrictions Weight Bearing Restrictions Per Provider Order: Yes RLE Weight Bearing Per Provider Order: Weight bearing as tolerated     Mobility  Bed Mobility Overal bed mobility: Needs Assistance Bed Mobility: Supine to Sit     Supine to sit: Supervision, HOB elevated     General bed mobility comments: Pt sat up on L side of bed with increased time. HOB elevated ~20deg. No use of bed rail.    Transfers Overall transfer level: Needs assistance Equipment used: Rolling walker (2 wheels) Transfers: Sit to/from Stand, Bed to chair/wheelchair/BSC Sit to Stand: Supervision   Step pivot transfers: Supervision       General transfer comment: Pt stood from lowest bed height. He powered up without physical assist. Cues for momentum. Transferred to recliner chair on left. Fair eccentric control.    Ambulation/Gait Ambulation/Gait assistance: Supervision Gait Distance (Feet): 150 Feet Assistive device: Rolling walker (2 wheels) Gait Pattern/deviations: Step-through pattern, Decreased stride length, Decreased stance time - right, Decreased weight shift to right, Antalgic Gait velocity: decreased Gait velocity interpretation: <1.8 ft/sec, indicate of risk for recurrent falls   General Gait Details: Pt ambulated with a reciprocal gait pattern. He self-limited weight-bearing on RLE. Decreased heel strike and push off. Pt resting in slight R knee flex able to achieve R knee ext with cues. Good proximity to RW. No LOB.   Stairs Stairs: Yes Stairs assistance: Contact guard assist Stair Management: Forwards, Step to pattern, One rail Right, No rails Number of Stairs: 2 (x2) General stair comments: Educated pt to ascend with LLE and descend with RLE. He utilized unilateral handrail on first attempt. Pt had 1 posterior LOB while coming down steps requiring modA to  correct. He turned around and attempted to take a step using right foot before getting set and centered. PT stabilized pt  and regained netural posture. Instructed pt to go slower and ensure feet are positioned in same direction and take one step slowly with support. Advanced to HHA by PT on second attempt. Pt maintained balance.   Wheelchair Mobility     Tilt Bed    Modified [facility] (Stroke Patients Only)       Balance Overall balance assessment: Needs assistance Sitting-balance support: No upper extremity supported, Feet supported Sitting balance-Leahy Scale: Fair     Standing balance support: Bilateral upper extremity supported, During functional activity, Reliant on assistive device for balance Standing balance-Leahy Scale: Poor Standing balance comment: Pt dependent on RW and rails/HHA.                            Communication Communication Communication: No apparent difficulties  Cognition Arousal: Alert Behavior During Therapy: WFL for tasks assessed/performed   PT - Cognitive impairments: No apparent impairments                       PT - Cognition Comments: Pt A,Ox4 Following commands: Intact      Cueing Cueing Techniques: Verbal cues, Visual cues  Exercises Total Joint Exercises Ankle Circles/Pumps: Right, AROM, 5 reps, Supine Quad Sets: Right, AROM, 5 reps, Supine Long Arc Quad: Seated, Right, 10 reps (with 5 second hold at top) Goniometric ROM: R Knee AROM: flexion ~45deg, ext ~2deg Other Exercises Other Exercises: Educated pt on R knee replacement HEP and provided handout. Demonstrated each exercises. Instructed pt to complete 10 reps 2-3x/day.    General Comments        Pertinent Vitals/Pain Pain Assessment Pain Assessment: 0-10 Pain Score: 4  Pain Location: R Knee Pain Descriptors / Indicators: Discomfort, Aching, Sore Pain Intervention(s): Monitored during session, Limited activity within patient's tolerance, Repositioned    Home Living                          Prior Function            PT Goals (current goals can now be found  in the care plan section) Acute Rehab PT Goals Patient Stated Goal: Return Home Progress towards PT goals: Progressing toward goals    Frequency    7X/week      PT Plan      Co-evaluation              AM-PAC PT 6 Clicks Mobility   Outcome Measure  Help needed turning from your back to your side while in a flat bed without using bedrails?: A Little Help needed moving from lying on your back to sitting on the side of a flat bed without using bedrails?: A Little Help needed moving to and from a bed to a chair (including a wheelchair)?: A Little Help needed standing up from a chair using your arms (e.g., wheelchair or bedside chair)?: A Little Help needed to walk in hospital room?: A Little Help needed climbing 3-5 steps with a railing? : A Little 6 Click Score: 18    End of Session Equipment Utilized During Treatment: Gait belt Activity Tolerance: Patient tolerated treatment well Patient left: in chair;with call bell/phone within reach;with family/visitor present Nurse Communication: Mobility status PT Visit Diagnosis: Other abnormalities of gait and mobility (R26.89);Difficulty in walking, not elsewhere classified (  R26.2);Pain Pain - Right/Left: Right Pain - part of body: Knee     Time: 8673-8652 PT Time Calculation (min) (ACUTE ONLY): 21 min  Charges:    $Gait Training: 8-22 mins PT General Charges $$ ACUTE PT VISIT: 1 Visit                     Brandon Soto, PT, Brandon Soto Acute Rehabilitation Services Office: [phone] Secure Chat Preferred  Brandon Soto 04/12/2024, 3:12 PM

## 2024-04-12 NOTE — Progress Notes (Signed)
 Pt and wife verbalized understanding of discharge instruction. Pt verbalized teachback instructions of post op surgical care including CHG and abx nasal ointments.  Education provided in AVS.  NO PIV NO TELE during discharge.  Patient has walker and Bone Foam and knee immobilizer at bedside.   Transferring to Main A.

## 2024-04-12 NOTE — Anesthesia Postprocedure Evaluation (Signed)
 Anesthesia Post Note  Patient: Brandon Soto  Procedure(s) Performed: ARTHROPLASTY, KNEE, UNICOMPARTMENTAL (Right: Knee)     Patient location during evaluation: PACU Anesthesia Type: Regional and Spinal Level of consciousness: awake Pain management: pain level controlled Vital Signs Assessment: post-procedure vital signs reviewed and stable Respiratory status: spontaneous breathing, nonlabored ventilation and respiratory function stable Cardiovascular status: blood pressure returned to baseline and stable Postop Assessment: no apparent nausea or vomiting Anesthetic complications: no   No notable events documented.  Last Vitals:  Vitals:   04/12/24 0555 04/12/24 0903  BP: 132/62 131/70  Pulse: 65 61  Resp: 16 17  Temp: 36.9 C 36.4 C  SpO2: 95% 94%    Last Pain:  Vitals:   04/12/24 0903  TempSrc: Oral  PainSc:                  Bernardino SQUIBB Harve Spradley

## 2024-04-12 NOTE — Progress Notes (Signed)
 Orthopedic Tech Progress Note Patient Details:  Brandon Soto 18-Sep-1945 [id number]  Ortho Devices Type of Ortho Device: Other (comment) Ortho Device/Splint Location: 0 degree bone foam/ RLE Ortho Device/Splint Interventions: Ordered, Application   Post Interventions Patient Tolerated: Well Instructions Provided: Adjustment of device  [name] 04/12/2024, 10:53 AM

## 2024-04-12 NOTE — TOC Progression Note (Addendum)
 Transition of Care (TOC) - Progression Note   Per secure chat with PA plan is OP PT , office arranged with [facility] [address] . Patient aware and has appointment for Friday.   Vannie was ordered through [employer] , called [facility] , they will deliver walker to room.   Bone Foam was ordered and is supplied through orthopedic tech at [facility].   Patient and wife aware of all of above.  Patient Details  Name: Brandon Soto MRN: [id number] Date of Birth: 22-Jul-1946  Transition of Care [facility]) CM/SW Contact  Kaaliyah Kita, Powell Jansky, RN Phone Number: 04/12/2024, 10:16 AM  Clinical Narrative:       Expected Discharge Plan: Home/Self Care Barriers to Discharge: Continued Medical Work up               Expected Discharge Plan and Services   Discharge Planning Services: CM Consult Post Acute Care Choice: Durable Medical Equipment Living arrangements for the past 2 months: Single Family Home                 DME Arranged:  (see note) DME Agency: [employer]       HH Arranged: NA           Social Drivers of Health (SDOH) Interventions SDOH Screenings   Food Insecurity: No Food Insecurity (04/11/2024)  Housing: Low Risk  (04/11/2024)  Transportation Needs: No Transportation Needs (04/11/2024)  Utilities: Not At Risk (04/11/2024)  Depression (PHQ2-9): Low Risk  (12/15/2022)  Physical Activity: Insufficiently Active (02/08/2019)   Received from [facility] visits prior to 09/26/2022.  Social Connections: Unknown (04/11/2024)  Tobacco Use: Low Risk  (04/11/2024)    Readmission Risk Interventions     No data to display

## 2024-04-12 NOTE — Progress Notes (Signed)
 Subjective: 1 Day Post-Op s/p Procedure(s): ARTHROPLASTY, KNEE, UNICOMPARTMENTAL   Patient is alert, oriented. Patient reports pain as severe at times. Though waving significant vomiting after oxycodone , unsure if dilaudid  is making him vomit as well. Foley removed, not yet voiding on own.  Denies chest pain, SOB, Calf pain. No nausea/vomiting.     Objective:  PE: VITALS:   Vitals:   04/11/24 2114 04/12/24 0021 04/12/24 0555 04/12/24 0903  BP: 127/63 (!) 124/59 132/62 131/70  Pulse: 60 66 65 61  Resp: 17 16 16 17   Temp: 98.4 F (36.9 C) (!) 97.5 F (36.4 C) 98.5 F (36.9 C) 97.6 F (36.4 C)  TempSrc: Oral Oral Oral Oral  SpO2: 92% 92% 95% 94%  Weight:      Height:       Sitting up in bed, in no acute distress Normal respiratory effort MSK:  Sensation intact distally Intact pulses distally Dorsiflexion/Plantar flexion intact Incision: moderate drainage, new mepilex today  LABS  Results for orders placed or performed during the hospital encounter of 04/11/24 (from the past 24 hours)  Glucose, capillary     Status: Abnormal   Collection Time: 04/11/24 10:15 AM  Result Value Ref Range   Glucose-Capillary 151 (H) 70 - 99 mg/dL  Glucose, capillary     Status: None   Collection Time: 04/11/24  1:17 PM  Result Value Ref Range   Glucose-Capillary 91 70 - 99 mg/dL  Glucose, capillary     Status: Abnormal   Collection Time: 04/11/24  6:32 PM  Result Value Ref Range   Glucose-Capillary 101 (H) 70 - 99 mg/dL   Comment 1 Notify RN   CBC     Status: Abnormal   Collection Time: 04/12/24  3:00 AM  Result Value Ref Range   WBC 7.4 4.0 - 10.5 K/uL   RBC 3.68 (L) 4.22 - 5.81 MIL/uL   Hemoglobin 12.3 (L) 13.0 - 17.0 g/dL   HCT 61.8 (L) 60.9 - 47.9 %   MCV 103.5 (H) 80.0 - 100.0 fL   MCH 33.4 26.0 - 34.0 pg   MCHC 32.3 30.0 - 36.0 g/dL   RDW 86.6 88.4 - 84.4 %   Platelets 116 (L) 150 - 400 K/uL   nRBC 0.0 0.0 - 0.2 %  Basic metabolic panel     Status: Abnormal    Collection Time: 04/12/24  3:00 AM  Result Value Ref Range   Sodium 137 135 - 145 mmol/L   Potassium 4.1 3.5 - 5.1 mmol/L   Chloride 104 98 - 111 mmol/L   CO2 26 22 - 32 mmol/L   Glucose, Bld 127 (H) 70 - 99 mg/dL   BUN 13 8 - 23 mg/dL   Creatinine, Ser 9.01 0.61 - 1.24 mg/dL   Calcium  8.6 (L) 8.9 - 10.3 mg/dL   GFR, Estimated >39 >39 mL/min   Anion gap 7 5 - 15    DG Knee Right Port Result Date: 04/11/2024 CLINICAL DATA:  [id number] S/P right unicompartmental knee replacement [id number] EXAM: PORTABLE RIGHT KNEE - 1-2 VIEW COMPARISON:  None Available. FINDINGS: Right knee medial compartment hemiarthroplasty in expected alignment. No periprosthetic lucency or fracture. There has been patellar resurfacing. Recent postsurgical change includes air and edema in the soft tissues and joint space. IMPRESSION: Right knee medial compartment hemiarthroplasty without immediate postoperative complication. Electronically Signed   By: Andrea Gasman M.D.   On: 04/11/2024 15:01    Today's  total administered Morphine  Milligram Equivalents: 35 Yesterday's  total administered Morphine  Milligram Equivalents: 115  Assessment/Plan: Principal Problem:   S/P right unicompartmental knee replacement Active Problems:   S/P knee replacement  1 Day Post-Op s/p Procedure(s): ARTHROPLASTY, KNEE, UNICOMPARTMENTAL  Weightbearing: WBAT RLE Insicional and dressing care: Reinforce dressings as needed VTE prophylaxis: Home Xarelto  restarted Pain control: significant vomiting with oxycodone  and dilaudid , changed to hydrocodone  and morphine  to see if this helps Follow - up plan: 2 weeks with Dr. Josefina Dispo: pending PT evals and better pain control without consistent vomiting   Contact information:   Army Daring, PA-C Weekdays 8-5  After hours and holidays please check [email or web address] for group call information for [facility]  Army MARLA Daring 04/12/2024, 9:12 AM

## 2024-04-12 NOTE — Evaluation (Signed)
 Physical Therapy Evaluation Patient Details Name: Brandon Soto MRN: [id number] DOB: Feb 27, 1946 Today's Date: 04/12/2024  History of Present Illness  Brandon Soto is a 78 y.o. male who presented to [facility] 04/11/24 with a diagnosis of anteromedial knee osteoarthritis who failed conservative measures and elected for surgical management. Pt s/p right unicompartmental knee arthroplasty 9/16. PMHx: BPH, prostate CA, CAD s/p LAD, CVA, DM, GERD, HTN, MI, OSA, and OA.   Clinical Impression  Pt admitted with above diagnosis. PTA, pt was independent to modI for functional mobility. He reports using a SPC for the past 30 days. Pt is independent with ADLs/IADLs, retired, and driving. He lives with his wife in a one story house with 1 STE. Pt currently with functional limitations due to the deficits listed below (see PT Problem List). He performed bed mobility with supervision and required CGA for transfers and gait using RW. Pt ambulated a household distance. He needs to practice stairs next session. Educated pt/family on HEP and provided handout. Pt will benefit from acute skilled PT to increase their independence and safety with mobility to allow discharge.      If plan is discharge home, recommend the following: A little help with walking and/or transfers;A little help with bathing/dressing/bathroom;Assistance with cooking/housework;Assist for transportation;Help with stairs or ramp for entrance   Can travel by private vehicle        Equipment Recommendations Rolling walker (2 wheels)  Recommendations for Other Services       Functional Status Assessment Patient has had a recent decline in their functional status and demonstrates the ability to make significant improvements in function in a reasonable and predictable amount of time.     Precautions / Restrictions Precautions Precautions: Knee;Fall Precaution Booklet Issued: Yes (comment) Recall of Precautions/Restrictions:  Impaired Precaution/Restrictions Comments: Educated pt on knee positioning considerations. Emphasized no pillow placed under the bend of the operative total knee and to keep the bed flat. Restrictions Weight Bearing Restrictions Per Provider Order: Yes RLE Weight Bearing Per Provider Order: Weight bearing as tolerated      Mobility  Bed Mobility Overal bed mobility: Needs Assistance Bed Mobility: Supine to Sit     Supine to sit: Supervision, HOB elevated, Used rails     General bed mobility comments: Pt sat up on L side of bed with increased time. Cues for sequencing. Pt managed RLE without physical assist. He utilized bed rail to aid in scooting fwd til feet flat.    Transfers Overall transfer level: Needs assistance Equipment used: Rolling walker (2 wheels) Transfers: Sit to/from Stand, Bed to chair/wheelchair/BSC Sit to Stand: Contact guard assist   Step pivot transfers: Contact guard assist       General transfer comment: Pt stood from lowest bed height. Cued proper hand placement using RW. Instructed pt to scoot fwd to edge of surface, increase knee flex, increase fwd lean, and use momentum to power up. Stood with CGA. Transferred to recliner chair. Good eccentric control, reaching back to armrest. Pt brought RLE slightly fwd prior to sitting to decrease knee flex and reduce pain.    Ambulation/Gait Ambulation/Gait assistance: Contact guard assist Gait Distance (Feet): 80 Feet Assistive device: Rolling walker (2 wheels) Gait Pattern/deviations: Step-to pattern, Decreased stride length, Decreased stance time - right, Decreased weight shift to right, Antalgic Gait velocity: decreased Gait velocity interpretation: <1.31 ft/sec, indicative of household ambulator   General Gait Details: Introduced RW and educate pt on proper and safe use of AD. Demonstrated correct technique. He  ambulated with short slow steps. Pt maintained appropriate proximity to RW and good upright  posture. He had limited heel strike and push off. Pt acheived adequate foot clearence. No knee buckling or LOB.  Stairs            Wheelchair Mobility     Tilt Bed    Modified Rankin (Stroke Patients Only)       Balance Overall balance assessment: Needs assistance Sitting-balance support: No upper extremity supported, Feet supported Sitting balance-Leahy Scale: Fair     Standing balance support: Bilateral upper extremity supported, During functional activity, Reliant on assistive device for balance Standing balance-Leahy Scale: Poor Standing balance comment: Pt dependent on RW                             Pertinent Vitals/Pain Pain Assessment Pain Assessment: 0-10 Pain Score: 5  Pain Location: R Knee Pain Descriptors / Indicators: Discomfort, Aching, Sore Pain Intervention(s): Monitored during session, Limited activity within patient's tolerance, RN gave pain meds during session, Repositioned    Home Living Family/patient expects to be discharged to:: Private residence Living Arrangements: Spouse/significant other Available Help at Discharge: Family;Available 24 hours/day Type of Home: House Home Access: Stairs to enter Entrance Stairs-Rails: None Entrance Stairs-Number of Steps: 1   Home Layout: One level Home Equipment: Cane - single point;Grab bars - tub/shower;Hand held shower head;Shower seat - built in      Prior Function Prior Level of Function : Independent/Modified Independent;Driving             Mobility Comments: Indep to ModI. Pt reports using cane for the past 30days. Denies falls in the past 61mo. ADLs Comments: Indep with ADLs/IADLs. Retired.     Extremity/Trunk Assessment   Upper Extremity Assessment Upper Extremity Assessment: Overall WFL for tasks assessed;Left hand dominant    Lower Extremity Assessment Lower Extremity Assessment: RLE deficits/detail RLE Deficits / Details: Pt POD 1 s/p unicompartment knee replacement.  He demonstrated decreased hip and knee AROM. Grossly 3-/5 strength. Ankle AROM and strength WFL. RLE: Unable to fully assess due to pain RLE Coordination: decreased gross motor    Cervical / Trunk Assessment Cervical / Trunk Assessment: Normal  Communication   Communication Communication: No apparent difficulties    Cognition Arousal: Alert Behavior During Therapy: WFL for tasks assessed/performed   PT - Cognitive impairments: No apparent impairments                       PT - Cognition Comments: Pt A,Ox4 Following commands: Intact       Cueing Cueing Techniques: Verbal cues, Gestural cues     General Comments General comments (skin integrity, edema, etc.): VSS on RA. Pt reported some nausea earlier but denied dizziness or lightheadedness during session.    Exercises Total Joint Exercises Ankle Circles/Pumps: Right, AROM, 5 reps, Supine Quad Sets: Right, AROM, 5 reps, Supine Long Arc Quad: Right, AROM, 5 reps, Supine Other Exercises Other Exercises: Educated pt on R knee replacement HEP and provided handout. Demonstrated each exercises. Instructed pt to complete 10 reps 2-3x/day.   Assessment/Plan    PT Assessment Patient needs continued PT services  PT Problem List Decreased strength;Decreased range of motion;Decreased activity tolerance;Decreased balance;Decreased mobility;Decreased knowledge of use of DME;Decreased safety awareness;Decreased knowledge of precautions;Pain       PT Treatment Interventions DME instruction;Gait training;Stair training;Functional mobility training;Therapeutic activities;Therapeutic exercise;Balance training;Patient/family education    PT Goals (Current goals  can be found in the Care Plan section)  Acute Rehab PT Goals Patient Stated Goal: Return Home PT Goal Formulation: With patient/family Time For Goal Achievement: 04/26/24 Potential to Achieve Goals: Good    Frequency 7X/week     Co-evaluation                AM-PAC PT 6 Clicks Mobility  Outcome Measure Help needed turning from your back to your side while in a flat bed without using bedrails?: A Little Help needed moving from lying on your back to sitting on the side of a flat bed without using bedrails?: A Little Help needed moving to and from a bed to a chair (including a wheelchair)?: A Little Help needed standing up from a chair using your arms (e.g., wheelchair or bedside chair)?: A Little Help needed to walk in hospital room?: A Little Help needed climbing 3-5 steps with a railing? : A Lot 6 Click Score: 17    End of Session Equipment Utilized During Treatment: Gait belt Activity Tolerance: Patient tolerated treatment well Patient left: in chair;with call bell/phone within reach;with chair alarm set;with family/visitor present Nurse Communication: Mobility status PT Visit Diagnosis: Other abnormalities of gait and mobility (R26.89);Difficulty in walking, not elsewhere classified (R26.2);Pain Pain - Right/Left: Right Pain - part of body: Knee    Time: 9160-9095 PT Time Calculation (min) (ACUTE ONLY): 25 min   Charges:   PT Evaluation $PT Eval Moderate Complexity: 1 Mod PT Treatments $Gait Training: 8-22 mins PT General Charges $$ ACUTE PT VISIT: 1 Visit         Randall SAUNDERS, PT, DPT Acute Rehabilitation Services Office: [phone] Secure Chat Preferred  Delon CHRISTELLA Callander 04/12/2024, 9:42 AM

## 2024-04-18 DIAGNOSIS — M25661 Stiffness of right knee, not elsewhere classified: Secondary | ICD-10-CM | POA: Diagnosis not present

## 2024-04-18 DIAGNOSIS — R262 Difficulty in walking, not elsewhere classified: Secondary | ICD-10-CM | POA: Diagnosis not present

## 2024-04-18 DIAGNOSIS — M6281 Muscle weakness (generalized): Secondary | ICD-10-CM | POA: Diagnosis not present

## 2024-04-18 DIAGNOSIS — M1711 Unilateral primary osteoarthritis, right knee: Secondary | ICD-10-CM | POA: Diagnosis not present

## 2024-04-19 NOTE — Discharge Summary (Signed)
 //Discharge Summary  Patient ID: Brandon Soto MRN: [id number] DOB/AGE: 1945/10/29 78 y.o.  Admit date: 04/11/2024 Discharge date: 04/12/24  Admission Diagnoses:  Right knee osteoarthritis  Discharge Diagnoses:  Principal Problem:   S/P right unicompartmental knee replacement Active Problems:   S/P knee replacement   Past Medical History:  Diagnosis Date   BPH (benign prostatic hyperplasia)    Cancer (HCC)    Prostate cancer   Cataract    bilateral   CLL (chronic lymphocytic leukemia) (HCC)    Coronary artery disease    s/p LAD intervention 2001   CVA (cerebral infarction)    Diabetes mellitus without complication (HCC)    Dyslipidemia    GERD (gastroesophageal reflux disease)    Hypertension    Hypothyroidism    shrank w/radiation ?1990's (06/27/2012)   Left knee DJD    Myocardial infarction (HCC)    OSA (obstructive sleep apnea) 11/29/2014   Osteoarthritis    PONV (postoperative nausea and vomiting)    Presence of permanent cardiac pacemaker    Sleep apnea    cpap   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 male who was admitted 04/11/2024 with a diagnosis of S/P right unicompartmental knee replacement and went to the operating room on 04/11/2024 and underwent the above named procedures.    Today's  total administered Morphine  Milligram Equivalents: 0 Yesterday's total administered Morphine  Milligram Equivalents: 0  He was given perioperative antibiotics:  Anti-infectives (From admission, onward)    Start     Dose/Rate Route Frequency Ordered Stop   04/11/24 1930  ceFAZolin  (ANCEF ) IVPB 2g/100 mL premix        2 g 200 mL/hr over 30 Minutes Intravenous Every 6 hours 04/11/24 1857 04/12/24 0235   04/11/24 0815  ceFAZolin  (ANCEF ) IVPB 2g/100 mL premix         2 g 200 mL/hr over 30 Minutes Intravenous On call to O.R. 04/11/24 0804 04/11/24 1050     .  He was given sequential compression devices, early ambulation, and home xarelto  for DVT prophylaxis.  He benefited maximally from the hospital stay and there were no complications.    Recent vital signs:  Vitals:   04/12/24 0903 04/12/24 1243  BP: 131/70 (!) 115/56  Pulse: 61 64  Resp: 17 17  Temp: 97.6 F (36.4 C) 97.6 F (36.4 C)  SpO2: 94% 92%    Recent laboratory studies:  Lab Results  Component Value Date   HGB 12.3 (L) 04/12/2024   HGB 14.5 03/15/2024   HGB 15.1 10/01/2023   Lab Results  Component Value Date   WBC 7.4 04/12/2024   PLT 116 (L) 04/12/2024   Lab Results  Component Value Date   INR 1.11 12/15/2013   Lab Results  Component Value Date   NA 137 04/12/2024   K 4.1 04/12/2024   CL 104 04/12/2024   CO2 26 04/12/2024   BUN 13 04/12/2024   CREATININE 0.98 04/12/2024   GLUCOSE 127 (H) 04/12/2024    Discharge Medications:   Allergies as of 04/12/2024   No Known Allergies      Medication List     STOP taking these medications    acetaminophen  500 MG tablet Commonly known as: TYLENOL    enoxaparin  100 MG/ML injection Commonly known as: LOVENOX   TAKE these medications    amiodarone  200 MG tablet Commonly known as: PACERONE  Take 1 tablet by mouth twice daily for 7 days and then reduce to 1 tablet by mouth daily thereafter.   B-12 5000 MCG Caps Take 5,000 mcg by mouth daily.   chlorhexidine  4 % external liquid Commonly known as: HIBICLENS  Apply 15 mLs (1 Application total) topically as directed for 30 doses. Use as directed daily for 5 days every other week for 6 weeks.   chlorthalidone  25 MG tablet Commonly known as: HYGROTON  Take 1 tablet (25 mg total) by mouth daily.   [employer] Fish Oil 1000 MG Caps Take 1,000 mg by mouth daily.   docusate sodium  100 MG capsule Commonly known as: COLACE Take 100 mg by mouth daily.   esomeprazole   40 MG capsule Commonly known as: NEXIUM  Take 40 mg by mouth daily.   finasteride  5 MG tablet Commonly known as: PROSCAR  Take 5 mg by mouth daily.   HYDROcodone -acetaminophen  10-325 MG tablet Commonly known as: NORCO Take 1 tablet by mouth every 4 (four) hours as needed for severe pain (pain score 7-10).   isosorbide  mononitrate 30 MG 24 hr tablet Commonly known as: IMDUR  Take 0.5 tablets (15 mg total) by mouth daily.   Jardiance  10 MG Tabs tablet Generic drug: empagliflozin  Take 10 mg by mouth daily.   levothyroxine  175 MCG tablet Commonly known as: SYNTHROID  Take 175 mcg by mouth daily before breakfast.   losartan  100 MG tablet Commonly known as: COZAAR  Take 1 tablet (100 mg total) by mouth daily.   losartan  50 MG tablet Commonly known as: COZAAR  Take 50 mg by mouth daily.   mupirocin  ointment 2 % Commonly known as: BACTROBAN  Place 1 Application into the nose 2 (two) times daily for 60 doses. Use as directed 2 times daily for 5 days every other week for 6 weeks.   ondansetron  4 MG disintegrating tablet Commonly known as: ZOFRAN -ODT Take 1 tablet (4 mg total) by mouth every 8 (eight) hours as needed for nausea or vomiting.   Ozempic (2 MG/DOSE) 8 MG/3ML Sopn Generic drug: Semaglutide (2 MG/DOSE) Inject 2 mg into the skin once a week.   polyethylene glycol 17 g packet Commonly known as: MIRALAX  / GLYCOLAX  Take 17 g by mouth daily.   pregabalin  150 MG capsule Commonly known as: LYRICA  Take 150 mg by mouth 2 (two) times daily.   QC TUMERIC COMPLEX PO Take 700 mg by mouth daily.   rivaroxaban  20 MG Tabs tablet Commonly known as: Xarelto  Take 1 tablet (20 mg total) by mouth daily with supper.   rosuvastatin  20 MG tablet Commonly known as: CRESTOR  Take 1 tablet (20 mg total) by mouth at bedtime.   tamsulosin  0.4 MG Caps capsule Commonly known as: FLOMAX  Take 0.4 mg by mouth in the morning and at bedtime.        Diagnostic Studies: DG Knee Right  Port Result Date: 04/11/2024 CLINICAL DATA:  [id number] S/P right unicompartmental knee replacement [id number] EXAM: PORTABLE RIGHT KNEE - 1-2 VIEW COMPARISON:  None Available. FINDINGS: Right knee medial compartment hemiarthroplasty in expected alignment. No periprosthetic lucency or fracture. There has been patellar resurfacing. Recent postsurgical change includes air and edema in the soft tissues and joint space. IMPRESSION: Right knee medial compartment hemiarthroplasty without immediate postoperative complication. Electronically Signed   By: Andrea Gasman M.D.   On: 04/11/2024 15:01    Disposition: Discharge disposition: 01-Home or Self Care  Follow-up Information     Josefina Chew, MD. Go on 04/26/2024.   Specialty: Orthopedic Surgery Why: your appointment is scheduled for 10:00. Contact information: [address] [place] [address] [phone]         [facility], [place]. Go on 04/14/2024.   Why: Your outpatient physical therapy appointment is scheduled. the office will call you with a time Contact information: [facility] [address] [place] [address] [phone]                  Signed: Kyliana Standen K Nester Bachus PA-C 04/19/2024, 8:30 AM

## 2024-04-20 DIAGNOSIS — M6281 Muscle weakness (generalized): Secondary | ICD-10-CM | POA: Diagnosis not present

## 2024-04-20 DIAGNOSIS — M25661 Stiffness of right knee, not elsewhere classified: Secondary | ICD-10-CM | POA: Diagnosis not present

## 2024-04-20 DIAGNOSIS — R262 Difficulty in walking, not elsewhere classified: Secondary | ICD-10-CM | POA: Diagnosis not present

## 2024-04-20 DIAGNOSIS — M1711 Unilateral primary osteoarthritis, right knee: Secondary | ICD-10-CM | POA: Diagnosis not present

## 2024-04-20 NOTE — Progress Notes (Signed)
 Remote PPM Transmission

## 2024-04-26 DIAGNOSIS — M6281 Muscle weakness (generalized): Secondary | ICD-10-CM | POA: Diagnosis not present

## 2024-04-26 DIAGNOSIS — M1711 Unilateral primary osteoarthritis, right knee: Secondary | ICD-10-CM | POA: Diagnosis not present

## 2024-04-26 DIAGNOSIS — M25661 Stiffness of right knee, not elsewhere classified: Secondary | ICD-10-CM | POA: Diagnosis not present

## 2024-04-26 DIAGNOSIS — R262 Difficulty in walking, not elsewhere classified: Secondary | ICD-10-CM | POA: Diagnosis not present

## 2024-04-28 DIAGNOSIS — M6281 Muscle weakness (generalized): Secondary | ICD-10-CM | POA: Diagnosis not present

## 2024-04-28 DIAGNOSIS — M1711 Unilateral primary osteoarthritis, right knee: Secondary | ICD-10-CM | POA: Diagnosis not present

## 2024-04-28 DIAGNOSIS — R262 Difficulty in walking, not elsewhere classified: Secondary | ICD-10-CM | POA: Diagnosis not present

## 2024-04-28 DIAGNOSIS — M25661 Stiffness of right knee, not elsewhere classified: Secondary | ICD-10-CM | POA: Diagnosis not present

## 2024-05-02 DIAGNOSIS — M1711 Unilateral primary osteoarthritis, right knee: Secondary | ICD-10-CM | POA: Diagnosis not present

## 2024-05-04 DIAGNOSIS — M1711 Unilateral primary osteoarthritis, right knee: Secondary | ICD-10-CM | POA: Diagnosis not present

## 2024-05-09 DIAGNOSIS — M6281 Muscle weakness (generalized): Secondary | ICD-10-CM | POA: Diagnosis not present

## 2024-05-09 DIAGNOSIS — M1711 Unilateral primary osteoarthritis, right knee: Secondary | ICD-10-CM | POA: Diagnosis not present

## 2024-05-09 DIAGNOSIS — M25661 Stiffness of right knee, not elsewhere classified: Secondary | ICD-10-CM | POA: Diagnosis not present

## 2024-05-10 DIAGNOSIS — Z96651 Presence of right artificial knee joint: Secondary | ICD-10-CM | POA: Diagnosis not present

## 2024-05-11 DIAGNOSIS — M6281 Muscle weakness (generalized): Secondary | ICD-10-CM | POA: Diagnosis not present

## 2024-05-11 DIAGNOSIS — M25661 Stiffness of right knee, not elsewhere classified: Secondary | ICD-10-CM | POA: Diagnosis not present

## 2024-05-11 DIAGNOSIS — M1711 Unilateral primary osteoarthritis, right knee: Secondary | ICD-10-CM | POA: Diagnosis not present

## 2024-05-17 DIAGNOSIS — E890 Postprocedural hypothyroidism: Secondary | ICD-10-CM | POA: Diagnosis not present

## 2024-05-17 DIAGNOSIS — I10 Essential (primary) hypertension: Secondary | ICD-10-CM | POA: Diagnosis not present

## 2024-05-17 DIAGNOSIS — E7849 Other hyperlipidemia: Secondary | ICD-10-CM | POA: Diagnosis not present

## 2024-05-17 DIAGNOSIS — Z125 Encounter for screening for malignant neoplasm of prostate: Secondary | ICD-10-CM | POA: Diagnosis not present

## 2024-05-17 DIAGNOSIS — E785 Hyperlipidemia, unspecified: Secondary | ICD-10-CM | POA: Diagnosis not present

## 2024-05-17 DIAGNOSIS — E1140 Type 2 diabetes mellitus with diabetic neuropathy, unspecified: Secondary | ICD-10-CM | POA: Diagnosis not present

## 2024-05-17 DIAGNOSIS — Z1389 Encounter for screening for other disorder: Secondary | ICD-10-CM | POA: Diagnosis not present

## 2024-05-18 DIAGNOSIS — M1711 Unilateral primary osteoarthritis, right knee: Secondary | ICD-10-CM | POA: Diagnosis not present

## 2024-05-18 DIAGNOSIS — M6281 Muscle weakness (generalized): Secondary | ICD-10-CM | POA: Diagnosis not present

## 2024-05-18 DIAGNOSIS — M25661 Stiffness of right knee, not elsewhere classified: Secondary | ICD-10-CM | POA: Diagnosis not present

## 2024-05-24 DIAGNOSIS — I4819 Other persistent atrial fibrillation: Secondary | ICD-10-CM | POA: Diagnosis not present

## 2024-05-24 DIAGNOSIS — Z1331 Encounter for screening for depression: Secondary | ICD-10-CM | POA: Diagnosis not present

## 2024-05-24 DIAGNOSIS — Z0000 Encounter for general adult medical examination without abnormal findings: Secondary | ICD-10-CM | POA: Diagnosis not present

## 2024-05-24 DIAGNOSIS — E785 Hyperlipidemia, unspecified: Secondary | ICD-10-CM | POA: Diagnosis not present

## 2024-05-24 DIAGNOSIS — N401 Enlarged prostate with lower urinary tract symptoms: Secondary | ICD-10-CM | POA: Diagnosis not present

## 2024-05-24 DIAGNOSIS — G4733 Obstructive sleep apnea (adult) (pediatric): Secondary | ICD-10-CM | POA: Diagnosis not present

## 2024-05-24 DIAGNOSIS — Z7901 Long term (current) use of anticoagulants: Secondary | ICD-10-CM | POA: Diagnosis not present

## 2024-05-24 DIAGNOSIS — Z6830 Body mass index (BMI) 30.0-30.9, adult: Secondary | ICD-10-CM | POA: Diagnosis not present

## 2024-05-24 DIAGNOSIS — Z8673 Personal history of transient ischemic attack (TIA), and cerebral infarction without residual deficits: Secondary | ICD-10-CM | POA: Diagnosis not present

## 2024-05-24 DIAGNOSIS — E1159 Type 2 diabetes mellitus with other circulatory complications: Secondary | ICD-10-CM | POA: Diagnosis not present

## 2024-05-24 DIAGNOSIS — E1140 Type 2 diabetes mellitus with diabetic neuropathy, unspecified: Secondary | ICD-10-CM | POA: Diagnosis not present

## 2024-05-24 DIAGNOSIS — Z1339 Encounter for screening examination for other mental health and behavioral disorders: Secondary | ICD-10-CM | POA: Diagnosis not present

## 2024-05-24 DIAGNOSIS — E890 Postprocedural hypothyroidism: Secondary | ICD-10-CM | POA: Diagnosis not present

## 2024-05-24 DIAGNOSIS — K219 Gastro-esophageal reflux disease without esophagitis: Secondary | ICD-10-CM | POA: Diagnosis not present

## 2024-05-24 DIAGNOSIS — E669 Obesity, unspecified: Secondary | ICD-10-CM | POA: Diagnosis not present

## 2024-05-24 DIAGNOSIS — I10 Essential (primary) hypertension: Secondary | ICD-10-CM | POA: Diagnosis not present

## 2024-05-24 DIAGNOSIS — I2510 Atherosclerotic heart disease of native coronary artery without angina pectoris: Secondary | ICD-10-CM | POA: Diagnosis not present

## 2024-05-25 DIAGNOSIS — M25661 Stiffness of right knee, not elsewhere classified: Secondary | ICD-10-CM | POA: Diagnosis not present

## 2024-05-25 DIAGNOSIS — M6281 Muscle weakness (generalized): Secondary | ICD-10-CM | POA: Diagnosis not present

## 2024-05-25 DIAGNOSIS — M1711 Unilateral primary osteoarthritis, right knee: Secondary | ICD-10-CM | POA: Diagnosis not present

## 2024-06-06 ENCOUNTER — Ambulatory Visit: Payer: Medicare Other

## 2024-06-06 DIAGNOSIS — I442 Atrioventricular block, complete: Secondary | ICD-10-CM | POA: Diagnosis not present

## 2024-06-08 LAB — CUP PACEART REMOTE DEVICE CHECK
Battery Remaining Longevity: 112 mo
Battery Voltage: 3 V
Brady Statistic AP VP Percent: 1.01 %
Brady Statistic AP VS Percent: 0 %
Brady Statistic AS VP Percent: 98.75 %
Brady Statistic AS VS Percent: 0.25 %
Brady Statistic RA Percent Paced: 1.14 %
Brady Statistic RV Percent Paced: 99.75 %
Date Time Interrogation Session: 20251112225113
Implantable Lead Connection Status: 753985
Implantable Lead Connection Status: 753985
Implantable Lead Implant Date: 20220812
Implantable Lead Implant Date: 20220812
Implantable Lead Location: 753859
Implantable Lead Location: 753860
Implantable Lead Model: 3830
Implantable Lead Model: 5076
Implantable Pulse Generator Implant Date: 20220812
Lead Channel Impedance Value: 285 Ohm
Lead Channel Impedance Value: 342 Ohm
Lead Channel Impedance Value: 456 Ohm
Lead Channel Impedance Value: 589 Ohm
Lead Channel Pacing Threshold Amplitude: 1.125 V
Lead Channel Pacing Threshold Amplitude: 1.375 V
Lead Channel Pacing Threshold Pulse Width: 0.4 ms
Lead Channel Pacing Threshold Pulse Width: 0.4 ms
Lead Channel Sensing Intrinsic Amplitude: 0.5 mV
Lead Channel Sensing Intrinsic Amplitude: 0.5 mV
Lead Channel Sensing Intrinsic Amplitude: 11.375 mV
Lead Channel Sensing Intrinsic Amplitude: 11.375 mV
Lead Channel Setting Pacing Amplitude: 2.25 V
Lead Channel Setting Pacing Amplitude: 2.75 V
Lead Channel Setting Pacing Pulse Width: 0.4 ms
Lead Channel Setting Sensing Sensitivity: 1.2 mV
Zone Setting Status: 755011

## 2024-06-09 NOTE — Progress Notes (Signed)
 Remote PPM Transmission

## 2024-06-12 ENCOUNTER — Telehealth: Payer: Self-pay | Admitting: Pulmonary Disease

## 2024-06-12 NOTE — Telephone Encounter (Signed)
 Pt c/o medication issue:  1. Name of Medication: amiodarone  (PACERONE ) , isosorbide  mononitrate (IMDUR )   2. How are you currently taking this medication (dosage and times per day)? As written   3. Are you having a reaction (difficulty breathing--STAT)? no  4. What is your medication issue?  Wife is questioning if pt is supposed to continue taking these meds. Requesting refill if so.  [employer] [facility] [address] [place], [place] - [address]

## 2024-06-13 ENCOUNTER — Ambulatory Visit: Payer: Self-pay | Admitting: Cardiology

## 2024-06-13 DIAGNOSIS — C61 Malignant neoplasm of prostate: Secondary | ICD-10-CM | POA: Diagnosis not present

## 2024-06-14 MED ORDER — AMIODARONE HCL 200 MG PO TABS: 200.0000 mg | ORAL_TABLET | Freq: Every day | ORAL | 3 refills | Status: AC

## 2024-06-14 MED ORDER — ISOSORBIDE MONONITRATE ER 30 MG PO TB24: 15.0000 mg | ORAL_TABLET | Freq: Every day | ORAL | 2 refills | Status: AC

## 2024-06-14 NOTE — Telephone Encounter (Signed)
 Kristie,   Yes, he should remain on amiodarone  200 mg daily.  He is planned to return to EP in 08/2024.  Will you also remind him to call and schedule? Hopefully, he is doing well after his knee surgery!  Thanks!  Daphne

## 2024-06-14 NOTE — Telephone Encounter (Signed)
 Patient states he has been taking amiodarone  1 tablet BID since it was prescribed in August. They have run out and there are no refills available.   Imdur  refill sent to pharmacy.  Will forward to Daphne Barrack, NP to advise on dosage of amiodarone  before sending refill.

## 2024-06-14 NOTE — Telephone Encounter (Signed)
 Patient states he has been taking Amiodarone  200 mg once daily. Refills sent to pharmacy.  Patient aware he will need to call to schedule EP follow-up for February 2026. Will also forward message to EP scheduler.

## 2024-06-30 NOTE — Telephone Encounter (Signed)
 Called and spoke w/ patient & his wife - patient is scheduled to see Daphne Barrack, NP on 08/30/24.

## 2024-07-10 ENCOUNTER — Telehealth: Payer: Self-pay | Admitting: Cardiovascular Disease

## 2024-07-10 DIAGNOSIS — R0683 Snoring: Secondary | ICD-10-CM

## 2024-07-10 DIAGNOSIS — I10 Essential (primary) hypertension: Secondary | ICD-10-CM

## 2024-07-10 DIAGNOSIS — E7800 Pure hypercholesterolemia, unspecified: Secondary | ICD-10-CM

## 2024-07-10 NOTE — Telephone Encounter (Signed)
°*  STAT* If patient is at the pharmacy, call can be transferred to refill team.   1. Which medications need to be refilled? (please list name of each medication and dose if known)  rosuvastatin  (CRESTOR ) 20 MG tablet  rivaroxaban  (XARELTO ) 20 MG TABS tablet   2. Would you like to learn more about the convenience, safety, & potential cost savings by using the [facility]? no   3. Are you open to using the [facility] ([facility]. No    4. Which pharmacy/location (including street and city if local pharmacy) is medication to be sent to? [id number] - [place], [place] - [address]    5. Do they need a 30 day or 90 day supply? 90 day

## 2024-07-11 ENCOUNTER — Other Ambulatory Visit: Payer: Self-pay | Admitting: Cardiovascular Disease

## 2024-07-11 DIAGNOSIS — I10 Essential (primary) hypertension: Secondary | ICD-10-CM

## 2024-07-11 DIAGNOSIS — E7800 Pure hypercholesterolemia, unspecified: Secondary | ICD-10-CM

## 2024-07-11 DIAGNOSIS — R0683 Snoring: Secondary | ICD-10-CM

## 2024-07-11 MED ORDER — RIVAROXABAN 20 MG PO TABS: 20.0000 mg | ORAL_TABLET | Freq: Every day | ORAL | 1 refills | Status: AC

## 2024-07-11 NOTE — Telephone Encounter (Signed)
 Refill rosuvastatin  has been sent. Will forward Xarelto .

## 2024-07-11 NOTE — Telephone Encounter (Signed)
 Pt last saw Daphne Barrack, NP on 03/03/24, last labs 04/12/24 Creat 0.98, age 78, weight 97.5kg, CrCl 84.29, based on CrCl pt is on appropriate dosage of Xarelto  20mg  every day for afib.  Will refill rx.

## 2024-07-14 ENCOUNTER — Encounter: Payer: Self-pay | Admitting: Cardiovascular Disease

## 2024-08-30 ENCOUNTER — Ambulatory Visit: Admitting: Pulmonary Disease

## 2024-08-30 ENCOUNTER — Encounter: Payer: Self-pay | Admitting: Pulmonary Disease

## 2024-08-30 VITALS — BP 132/72 | HR 68 | Ht 73.0 in | Wt 225.0 lb

## 2024-08-30 DIAGNOSIS — Z79899 Other long term (current) drug therapy: Secondary | ICD-10-CM | POA: Diagnosis not present

## 2024-08-30 DIAGNOSIS — I442 Atrioventricular block, complete: Secondary | ICD-10-CM

## 2024-08-30 DIAGNOSIS — I10 Essential (primary) hypertension: Secondary | ICD-10-CM | POA: Diagnosis not present

## 2024-08-30 DIAGNOSIS — G4733 Obstructive sleep apnea (adult) (pediatric): Secondary | ICD-10-CM | POA: Diagnosis not present

## 2024-08-30 DIAGNOSIS — D6869 Other thrombophilia: Secondary | ICD-10-CM

## 2024-08-30 DIAGNOSIS — Z950 Presence of cardiac pacemaker: Secondary | ICD-10-CM | POA: Diagnosis not present

## 2024-08-30 DIAGNOSIS — I480 Paroxysmal atrial fibrillation: Secondary | ICD-10-CM

## 2024-08-30 LAB — CUP PACEART INCLINIC DEVICE CHECK
Battery Remaining Longevity: 111 mo
Battery Voltage: 3 V
Brady Statistic AP VP Percent: 2.59 %
Brady Statistic AP VS Percent: 0 %
Brady Statistic AS VP Percent: 97.22 %
Brady Statistic AS VS Percent: 0.19 %
Brady Statistic RA Percent Paced: 2.68 %
Brady Statistic RV Percent Paced: 99.8 %
Date Time Interrogation Session: 20260204100935
Implantable Lead Connection Status: 753985
Implantable Lead Connection Status: 753985
Implantable Lead Implant Date: 20220812
Implantable Lead Implant Date: 20220812
Implantable Lead Location: 753859
Implantable Lead Location: 753860
Implantable Lead Model: 3830
Implantable Lead Model: 5076
Implantable Pulse Generator Implant Date: 20220812
Lead Channel Impedance Value: 304 Ohm
Lead Channel Impedance Value: 361 Ohm
Lead Channel Impedance Value: 551 Ohm
Lead Channel Impedance Value: 608 Ohm
Lead Channel Pacing Threshold Amplitude: 1.125 V
Lead Channel Pacing Threshold Amplitude: 1.375 V
Lead Channel Pacing Threshold Pulse Width: 0.4 ms
Lead Channel Pacing Threshold Pulse Width: 0.4 ms
Lead Channel Sensing Intrinsic Amplitude: 0.5 mV
Lead Channel Sensing Intrinsic Amplitude: 0.5 mV
Lead Channel Sensing Intrinsic Amplitude: 11.375 mV
Lead Channel Sensing Intrinsic Amplitude: 11.375 mV
Lead Channel Setting Pacing Amplitude: 2.25 V
Lead Channel Setting Pacing Amplitude: 2.75 V
Lead Channel Setting Pacing Pulse Width: 0.4 ms
Lead Channel Setting Sensing Sensitivity: 1.2 mV
Zone Setting Status: 755011

## 2024-08-30 LAB — COMPREHENSIVE METABOLIC PANEL WITH GFR
ALT: 23 [IU]/L (ref 0–44)
AST: 21 [IU]/L (ref 0–40)
Albumin: 4.5 g/dL (ref 3.8–4.8)
Alkaline Phosphatase: 71 [IU]/L (ref 47–123)
BUN/Creatinine Ratio: 18 (ref 10–24)
BUN: 17 mg/dL (ref 8–27)
Bilirubin Total: 0.9 mg/dL (ref 0.0–1.2)
CO2: 21 mmol/L (ref 20–29)
Calcium: 9.3 mg/dL (ref 8.6–10.2)
Chloride: 101 mmol/L (ref 96–106)
Creatinine, Ser: 0.96 mg/dL (ref 0.76–1.27)
Globulin, Total: 1.8 g/dL (ref 1.5–4.5)
Glucose: 99 mg/dL (ref 70–99)
Potassium: 4.5 mmol/L (ref 3.5–5.2)
Sodium: 138 mmol/L (ref 134–144)
Total Protein: 6.3 g/dL (ref 6.0–8.5)
eGFR: 80 mL/min/{1.73_m2}

## 2024-08-30 LAB — TSH: TSH: 4.12 u[IU]/mL (ref 0.450–4.500)

## 2024-08-30 LAB — T4, FREE: Free T4: 1.69 ng/dL (ref 0.82–1.77)

## 2024-08-30 NOTE — Patient Instructions (Addendum)
 Medication Instructions:  Your physician recommends that you continue on your current medications as directed. Please refer to the Current Medication list given to you today.  *If you need a refill on your cardiac medications before your next appointment, please call your pharmacy*  Lab Work: CMP, TSH, FreeT4-TODAY If you have labs (blood work) drawn today and your tests are completely normal, you will receive your results only by: MyChart Message (if you have MyChart) OR A paper copy in the mail If you have any lab test that is abnormal or we need to change your treatment, we will call you to review the results.  Follow-Up: At [facility], you and your health needs are our priority.  As part of our continuing mission to provide you with exceptional heart care, our providers are all part of one team.  This team includes your primary Cardiologist (physician) and Advanced Practice Providers or APPs (Physician Assistants and Nurse Practitioners) who all work together to provide you with the care you need, when you need it.  Your next appointment:   Next available with Eulas Furbish, MD to discuss ablation.  1 year with Daphne Barrack, NP

## 2024-08-31 ENCOUNTER — Ambulatory Visit: Payer: Self-pay | Admitting: Pulmonary Disease

## 2024-10-11 ENCOUNTER — Ambulatory Visit: Admitting: Cardiovascular Disease

## 2024-12-05 ENCOUNTER — Ambulatory Visit

## 2025-03-06 ENCOUNTER — Ambulatory Visit

## 2025-03-21 ENCOUNTER — Ambulatory Visit: Admitting: Hematology and Oncology

## 2025-03-21 ENCOUNTER — Other Ambulatory Visit

## 2025-06-05 ENCOUNTER — Ambulatory Visit

## 2025-09-04 ENCOUNTER — Ambulatory Visit

## 2025-12-04 ENCOUNTER — Ambulatory Visit

## 2026-03-05 ENCOUNTER — Ambulatory Visit

## 2026-06-04 ENCOUNTER — Ambulatory Visit

## 2026-09-03 ENCOUNTER — Ambulatory Visit
